# Patient Record
Sex: Female | Born: 1945 | Race: Black or African American | Hispanic: No | Smoking: Never smoker
Health system: Southern US, Community
[De-identification: ages and names within clinical notes are randomized; demographics above are authoritative.]

## PROBLEM LIST (undated history)

## (undated) DIAGNOSIS — G43909 Migraine, unspecified, not intractable, without status migrainosus: Secondary | ICD-10-CM

## (undated) DIAGNOSIS — M109 Gout, unspecified: Secondary | ICD-10-CM

## (undated) DIAGNOSIS — Z9889 Other specified postprocedural states: Secondary | ICD-10-CM

## (undated) DIAGNOSIS — E119 Type 2 diabetes mellitus without complications: Secondary | ICD-10-CM

## (undated) DIAGNOSIS — I10 Essential (primary) hypertension: Secondary | ICD-10-CM

## (undated) DIAGNOSIS — M549 Dorsalgia, unspecified: Secondary | ICD-10-CM

## (undated) DIAGNOSIS — G8929 Other chronic pain: Secondary | ICD-10-CM

## (undated) DIAGNOSIS — J45909 Unspecified asthma, uncomplicated: Secondary | ICD-10-CM

## (undated) HISTORY — PX: BREAST SURGERY: SHX581

## (undated) HISTORY — DX: Type 2 diabetes mellitus without complications: E11.9

## (undated) HISTORY — PX: BREAST EXCISIONAL BIOPSY: SUR124

## (undated) HISTORY — PX: ABDOMINAL HYSTERECTOMY: SHX81

## (undated) HISTORY — DX: Gout, unspecified: M10.9

## (undated) HISTORY — PX: BREAST BIOPSY: SHX20

---

## 2010-12-12 DIAGNOSIS — Z9889 Other specified postprocedural states: Secondary | ICD-10-CM

## 2010-12-12 HISTORY — DX: Other specified postprocedural states: Z98.890

## 2010-12-30 ENCOUNTER — Ambulatory Visit (HOSPITAL_COMMUNITY)
Admission: RE | Admit: 2010-12-30 | Discharge: 2010-12-30 | Payer: Self-pay | Source: Home / Self Care | Attending: Internal Medicine | Admitting: Internal Medicine

## 2011-01-10 ENCOUNTER — Ambulatory Visit (HOSPITAL_COMMUNITY)
Admission: RE | Admit: 2011-01-10 | Discharge: 2011-01-10 | Payer: Self-pay | Source: Home / Self Care | Attending: Internal Medicine | Admitting: Internal Medicine

## 2011-01-12 HISTORY — PX: COLONOSCOPY: SHX174

## 2011-01-17 ENCOUNTER — Encounter: Payer: Self-pay | Admitting: Internal Medicine

## 2011-01-24 ENCOUNTER — Encounter: Payer: Medicare Other | Admitting: Internal Medicine

## 2011-01-24 ENCOUNTER — Ambulatory Visit (HOSPITAL_COMMUNITY)
Admission: RE | Admit: 2011-01-24 | Discharge: 2011-01-24 | Disposition: A | Discharge status: Home / Self Care | Payer: Medicare Other | Source: Ambulatory Visit | Attending: Internal Medicine | Admitting: Internal Medicine

## 2011-01-24 DIAGNOSIS — Z1211 Encounter for screening for malignant neoplasm of colon: Secondary | ICD-10-CM | POA: Insufficient documentation

## 2011-01-27 NOTE — Letter (Signed)
Summary: [facility] TRIAGE  [facility] TRIAGE   Imported By: Rexene Alberts 01/17/2011 14:40:38  _____________________________________________________________________  External Attachment:    Type:   Image     Comment:   External Document  Appended Document: [facility] TRIAGE ok as is  Appended Document: [facility] TRIAGE PATIENT IS PICKING UP HER INSTRUCTIONS AT THE OFFICE

## 2011-02-01 NOTE — Op Note (Signed)
  NAME:  Brianna Manning, VATH NO.:  [id number]  MEDICAL RECORD NO.:  [id number]           PATIENT TYPE:  O  LOCATION:  DAYP                          FACILITY:  [facility]  PHYSICIAN:  R. Roetta Sessions, M.D. DATE OF BIRTH:  October 20, 1946  DATE OF PROCEDURE:  01/24/2011 DATE OF DISCHARGE:                              OPERATIVE REPORT   PROCEDURE:  Screening colonoscopy.  INDICATIONS FOR PROCEDURE:  A 65 year old lady, referred for first ever screening colonoscopy.  No lower GI tract symptoms.  No family history of colon cancer, colon polyps.  Colonoscopy is now being done as screening maneuver.  Risks, benefits, limitations, alternatives, imponderables have been reviewed, questions answered.  Please see the documentation in the medical record.  PROCEDURE NOTE:  O2 saturation, blood pressure, pulse, respirations were monitored throughout the entire procedure.  CONSCIOUS SEDATION:  Versed 5 mg IV, Demerol 100 mg IV in divided doses.  INSTRUMENT:  Pentax video chip system.  FINDINGS:  Digital rectal exam revealed no abnormalities.  Endoscopic findings:  Prep was adequate.  Colon:  Colonic mucosa was surveyed from the rectosigmoid junction through the left transverse right colon to the appendiceal orifice, ileocecal valve/cecum.  These structures were well seen and photographed for the record.  From this level, scope was slowly and cautiously withdrawn.  All previously mucosal surfaces were again seen.  The colonic mucosa appeared normal.  Scope was pulled down to the rectum where a thorough examination of rectal mucosa including retroflexed view of the anal verge demonstrated only a couple of anal papilla.  The patient tolerated the procedure well.  Cecal withdrawal time 7 minutes.  IMPRESSION: 1. Anal papilla, otherwise normal rectum. 2. Normal colon.  RECOMMENDATIONS:  Consider repeat screening colonoscopy in 10 years.     Jonathon Bellows, M.D.     RMR/MEDQ   D:  01/24/2011  T:  01/24/2011  Job:  [id number]  Electronically Signed by Lorrin Goodell M.D. on 02/01/2011 02:34:31 PM

## 2011-03-31 ENCOUNTER — Ambulatory Visit (HOSPITAL_COMMUNITY)
Admission: RE | Admit: 2011-03-31 | Discharge: 2011-03-31 | Disposition: A | Discharge status: Home / Self Care | Payer: Medicare Other | Source: Ambulatory Visit | Attending: Internal Medicine | Admitting: Internal Medicine

## 2011-03-31 ENCOUNTER — Other Ambulatory Visit (HOSPITAL_COMMUNITY): Payer: Self-pay | Admitting: Internal Medicine

## 2011-03-31 DIAGNOSIS — M1990 Unspecified osteoarthritis, unspecified site: Secondary | ICD-10-CM

## 2011-03-31 DIAGNOSIS — M545 Low back pain, unspecified: Secondary | ICD-10-CM | POA: Insufficient documentation

## 2011-07-25 ENCOUNTER — Encounter: Payer: Self-pay | Admitting: *Deleted

## 2011-07-25 ENCOUNTER — Other Ambulatory Visit: Payer: Self-pay

## 2011-07-25 ENCOUNTER — Emergency Department (HOSPITAL_COMMUNITY)
Admission: EM | Admit: 2011-07-25 | Discharge: 2011-07-25 | Disposition: A | Discharge status: Home / Self Care | Payer: Medicare Other | Attending: Emergency Medicine | Admitting: Emergency Medicine

## 2011-07-25 DIAGNOSIS — G43909 Migraine, unspecified, not intractable, without status migrainosus: Secondary | ICD-10-CM

## 2011-07-25 DIAGNOSIS — I10 Essential (primary) hypertension: Secondary | ICD-10-CM | POA: Insufficient documentation

## 2011-07-25 LAB — BASIC METABOLIC PANEL
CO2: 24 mEq/L (ref 19–32)
Calcium: 9.9 mg/dL (ref 8.4–10.5)
Sodium: 141 mEq/L (ref 135–145)

## 2011-07-25 MED ORDER — OXYCODONE-ACETAMINOPHEN 5-325 MG PO TABS: 1.0000 | ORAL_TABLET | ORAL | Status: AC | PRN

## 2011-07-25 NOTE — ED Notes (Signed)
Pt states that her blood pressure was high this am. Also c/o headache, pain under her left breast and have felt like her heart was fluttering. Also states that she had some slurred speech this am but has stopped now.

## 2011-07-25 NOTE — ED Notes (Signed)
Patient is patiently waiting in her room states she is having a headache. Turned off lights to see if that helps any and updated vitals.

## 2011-08-25 NOTE — ED Provider Notes (Signed)
History     CSN: [id number] Arrival date & time: 07/25/2011 10:58 AM   Chief Complaint  Patient presents with  . Hypertension     (Include location/radiation/quality/duration/timing/severity/associated sxs/prior treatment) HPI She c/o a migraine and htn She denies fever, rash, weakness.     History reviewed. No pertinent past medical history.   Past Surgical History  Procedure Date  . Breast surgery     biopsy  . Abdominal hysterectomy     History reviewed. No pertinent family history.  History  Substance Use Topics  . Smoking status: Never Smoker   . Smokeless tobacco: Not on file  . Alcohol Use: No    OB History    Grav Para Term Preterm Abortions TAB SAB Ect Mult Living                  Review of Systems  Constitutional: Negative for chills and diaphoresis.  HENT: Negative for congestion and neck pain.   Eyes: Positive for photophobia. Negative for redness.  Respiratory: Negative for cough, chest tightness and shortness of breath.   Gastrointestinal: Positive for nausea. Negative for abdominal pain.  Genitourinary: Negative for dysuria.  Musculoskeletal: Negative for back pain.  Skin: Negative for rash.  Neurological: Positive for headaches. Negative for light-headedness and numbness.  Psychiatric/Behavioral: Negative for confusion.    Allergies  Review of patient's allergies indicates no known allergies.  Home Medications   Current Outpatient Rx  Name Route Sig Dispense Refill  . IBUPROFEN 200 MG PO TABS Oral Take 400 mg by mouth every 6 (six) hours as needed. For pain     . ONE-A-DAY WOMENS 50 PLUS PO Oral Take 1 tablet by mouth daily.      Marland Kitchen RASPBERRY PO Oral Take 1 tablet by mouth daily.        Physical Exam    BP 168/98  Pulse 66  Temp(Src) 98.1 F (36.7 C) (Oral)  Resp 18  Ht 5\' 4"  (1.626 m)  Wt 250 lb (113.399 kg)  BMI 42.91 kg/m2  SpO2 98%  Physical Exam  Constitutional: She is oriented to person, place, and time. She appears  well-developed and well-nourished.  HENT:  Head: Normocephalic and atraumatic.  Eyes: Pupils are equal, round, and reactive to light.  Neck: Normal range of motion.  Cardiovascular: Normal rate, regular rhythm and normal heart sounds.   No murmur heard. Pulmonary/Chest: Effort normal and breath sounds normal. No respiratory distress. She has no wheezes. She has no rales.  Abdominal: Soft. She exhibits no distension and no mass. There is no tenderness. There is no rebound and no guarding.  Musculoskeletal: Normal range of motion. She exhibits no edema and no tenderness.       No nuchal rigidity  Neurological: She is alert and oriented to person, place, and time. No cranial nerve deficit.  Skin: Skin is warm and dry. No rash noted. No erythema.       No rash  Psychiatric: She has a normal mood and affect. Her behavior is normal.    ED Course  Procedures  Results for orders placed during the hospital encounter of 07/25/11  BASIC METABOLIC PANEL      Component Value Range   Sodium 141  135 - 145 (mEq/L)   Potassium 3.6  3.5 - 5.1 (mEq/L)   Chloride 104  96 - 112 (mEq/L)   CO2 24  19 - 32 (mEq/L)   Glucose, Bld 95  70 - 99 (mg/dL)   BUN 10  6 - 23 (mg/dL)   Creatinine, Ser 0.96  0.50 - 1.10 (mg/dL)   Calcium 9.9  8.4 - 04.5 (mg/dL)   GFR calc non Af Amer >60  >60 (mL/min)   GFR calc Af Amer >60  >60 (mL/min)   No results found.   1. Hypertension   2. Migraine headache      MDM Migraine htn       Nicholes Stairs, MD 08/30/11 1406

## 2011-11-07 ENCOUNTER — Ambulatory Visit (HOSPITAL_COMMUNITY)
Admission: RE | Admit: 2011-11-07 | Discharge: 2011-11-07 | Disposition: A | Discharge status: Home / Self Care | Payer: Medicare Other | Source: Ambulatory Visit | Attending: Internal Medicine | Admitting: Internal Medicine

## 2011-11-07 DIAGNOSIS — R0609 Other forms of dyspnea: Secondary | ICD-10-CM | POA: Insufficient documentation

## 2011-11-07 DIAGNOSIS — R002 Palpitations: Secondary | ICD-10-CM

## 2011-11-07 DIAGNOSIS — R0989 Other specified symptoms and signs involving the circulatory and respiratory systems: Secondary | ICD-10-CM | POA: Insufficient documentation

## 2011-11-07 NOTE — Progress Notes (Signed)
*  PRELIMINARY RESULTS* Echocardiogram 24H Holter monitor  has been performed.  Brianna Manning 11/07/2011, 1:40 PM

## 2011-11-09 NOTE — Procedures (Signed)
NAMEMarland Kitchen  ALAISA, Brianna Manning NO.:  [id number]  MEDICAL RECORD NO.:  [id number]  LOCATION:  CARDIOPU                      FACILITY:  [facility]  PHYSICIAN:  Gerrit Friends. Dietrich Pates, MD, FACCDATE OF BIRTH:  [birth year]  DATE OF PROCEDURE: DATE OF DISCHARGE:  11/07/2011                               HOLTER MONITOR   DATE OF RECORDING:  November 07, 2011 - November 08, 2011.  REFERRING PHYSICIAN:  Tesfaye D. Felecia Shelling, MD  CLINICAL DATA:  A 65 year old woman with palpitations and dyspnea. 1. Continuous electrocardiographic monitoring was maintained for 24     hours during which the predominant rhythm was normal sinus with     minimal sinus tachycardia to a peak heart rate of 114 beats per     minute. 2. No ventricular ectopy was identified.  Minimal supraventricular     ectopy was present with a total of 2 beats identified over the 24-     hour recording interval.  No other arrhythmia was detected. 3. No significant ST-segment elevation or depression occurred. 4. A complete diary of activity was returned with 7 episodes of     dyspnea, 2 of them associated with palpitations reported, each of     which occurred with activity.  On all occasions, the rhythm was     normal sinus without significant ST-segment abnormalities.     Gerrit Friends. Dietrich Pates, MD, [facility]     RMR/MEDQ  D:  11/08/2011  T:  11/08/2011  Job:  [id number]

## 2012-04-13 DIAGNOSIS — I10 Essential (primary) hypertension: Secondary | ICD-10-CM | POA: Diagnosis not present

## 2012-04-13 DIAGNOSIS — IMO0001 Reserved for inherently not codable concepts without codable children: Secondary | ICD-10-CM | POA: Diagnosis not present

## 2012-04-13 DIAGNOSIS — J309 Allergic rhinitis, unspecified: Secondary | ICD-10-CM | POA: Diagnosis not present

## 2012-05-03 ENCOUNTER — Other Ambulatory Visit (HOSPITAL_COMMUNITY): Payer: Self-pay | Admitting: Internal Medicine

## 2012-05-03 ENCOUNTER — Ambulatory Visit (HOSPITAL_COMMUNITY)
Admission: RE | Admit: 2012-05-03 | Discharge: 2012-05-03 | Disposition: A | Discharge status: Home / Self Care | Payer: Medicare Other | Source: Ambulatory Visit | Attending: Internal Medicine | Admitting: Internal Medicine

## 2012-05-03 DIAGNOSIS — J40 Bronchitis, not specified as acute or chronic: Secondary | ICD-10-CM

## 2012-05-03 DIAGNOSIS — M1990 Unspecified osteoarthritis, unspecified site: Secondary | ICD-10-CM | POA: Diagnosis not present

## 2012-05-03 DIAGNOSIS — I10 Essential (primary) hypertension: Secondary | ICD-10-CM | POA: Insufficient documentation

## 2012-05-03 DIAGNOSIS — R062 Wheezing: Secondary | ICD-10-CM | POA: Diagnosis not present

## 2012-05-03 DIAGNOSIS — J410 Simple chronic bronchitis: Secondary | ICD-10-CM | POA: Diagnosis not present

## 2012-05-03 DIAGNOSIS — R0602 Shortness of breath: Secondary | ICD-10-CM | POA: Diagnosis not present

## 2012-05-29 DIAGNOSIS — N390 Urinary tract infection, site not specified: Secondary | ICD-10-CM | POA: Diagnosis not present

## 2012-06-30 ENCOUNTER — Emergency Department (HOSPITAL_COMMUNITY)
Admission: EM | Admit: 2012-06-30 | Discharge: 2012-06-30 | Disposition: A | Discharge status: Home / Self Care | Payer: Medicare Other | Attending: Emergency Medicine | Admitting: Emergency Medicine

## 2012-06-30 ENCOUNTER — Encounter (HOSPITAL_COMMUNITY): Payer: Self-pay | Admitting: *Deleted

## 2012-06-30 DIAGNOSIS — R0602 Shortness of breath: Secondary | ICD-10-CM | POA: Diagnosis not present

## 2012-06-30 DIAGNOSIS — I10 Essential (primary) hypertension: Secondary | ICD-10-CM | POA: Diagnosis not present

## 2012-06-30 DIAGNOSIS — E860 Dehydration: Secondary | ICD-10-CM | POA: Insufficient documentation

## 2012-06-30 DIAGNOSIS — R55 Syncope and collapse: Secondary | ICD-10-CM | POA: Insufficient documentation

## 2012-06-30 DIAGNOSIS — R42 Dizziness and giddiness: Secondary | ICD-10-CM | POA: Diagnosis not present

## 2012-06-30 DIAGNOSIS — R6889 Other general symptoms and signs: Secondary | ICD-10-CM | POA: Diagnosis not present

## 2012-06-30 HISTORY — DX: Unspecified asthma, uncomplicated: J45.909

## 2012-06-30 HISTORY — DX: Essential (primary) hypertension: I10

## 2012-06-30 LAB — CBC WITH DIFFERENTIAL/PLATELET
Hemoglobin: 12.4 g/dL (ref 12.0–15.0)
Lymphocytes Relative: 19 % (ref 12–46)
Lymphs Abs: 2.1 10*3/uL (ref 0.7–4.0)
MCH: 31.6 pg (ref 26.0–34.0)
Monocytes Relative: 6 % (ref 3–12)
Neutro Abs: 7.6 10*3/uL (ref 1.7–7.7)
Neutrophils Relative %: 72 % (ref 43–77)
RBC: 3.93 MIL/uL (ref 3.87–5.11)

## 2012-06-30 LAB — BASIC METABOLIC PANEL
GFR calc Af Amer: 53 mL/min — ABNORMAL LOW (ref >90)
GFR calc non Af Amer: 46 mL/min — ABNORMAL LOW (ref >90)
Glucose, Bld: 133 mg/dL — ABNORMAL HIGH (ref 70–99)
Potassium: 3.6 mEq/L (ref 3.5–5.1)
Sodium: 142 mEq/L (ref 135–145)

## 2012-06-30 LAB — POCT I-STAT TROPONIN I

## 2012-06-30 MED ORDER — SODIUM CHLORIDE 0.9 % IV BOLUS (SEPSIS): 1000.0000 mL | Freq: Once | INTRAVENOUS | Status: DC

## 2012-06-30 MED ORDER — ALBUTEROL SULFATE (5 MG/ML) 0.5% IN NEBU: 2.5000 mg | INHALATION_SOLUTION | Freq: Once | RESPIRATORY_TRACT | Status: DC

## 2012-06-30 MED ORDER — SODIUM CHLORIDE 0.9 % IV BOLUS (SEPSIS)
1000.0000 mL | Freq: Once | INTRAVENOUS | Status: AC
  Administered 2012-06-30: 1000 mL via INTRAVENOUS

## 2012-06-30 NOTE — ED Provider Notes (Signed)
History     CSN: [id number]  Arrival date & time 06/30/12  1449   First MD Initiated Contact with Patient 06/30/12 1522      Chief Complaint  Patient presents with  . Near Syncope    (Consider location/radiation/quality/duration/timing/severity/associated sxs/prior treatment) Patient is a 66 y.o. female presenting with general illness. The history is provided by the patient and a friend.  Illness  The current episode started today. The onset was sudden. The problem occurs rarely. The problem has been rapidly improving. The problem is severe. The symptoms are relieved by rest (IVF). Nothing aggravates the symptoms. Associated symptoms include nausea and wheezing. Pertinent negatives include no fever, no decreased vision, no double vision, no abdominal pain, no vomiting, no congestion, no headaches, no rhinorrhea, no sore throat, no muscle aches, no cough, no URI and no rash. She has received no recent medical care.    Past Medical History  Diagnosis Date  . Hypertension   . Asthma     Past Surgical History  Procedure Date  . Breast surgery     biopsy  . Abdominal hysterectomy     No family history on file.  History  Substance Use Topics  . Smoking status: Never Smoker   . Smokeless tobacco: Not on file  . Alcohol Use: No    OB History    Grav Para Term Preterm Abortions TAB SAB Ect Mult Living                  Review of Systems  Constitutional: Negative for fever, chills, diaphoresis and fatigue.  HENT: Negative for congestion, sore throat and rhinorrhea.   Eyes: Negative for double vision and visual disturbance.  Respiratory: Positive for shortness of breath (relieved with albuterol) and wheezing. Negative for cough and chest tightness.   Cardiovascular: Negative for chest pain and palpitations.  Gastrointestinal: Positive for nausea. Negative for vomiting and abdominal pain.  Musculoskeletal: Negative for back pain.  Skin: Negative for color change and rash.    Neurological: Positive for light-headedness. Negative for dizziness, syncope (presyncope) and headaches.  Psychiatric/Behavioral: Negative for confusion.  All other systems reviewed and are negative.    Allergies  Review of patient's allergies indicates no known allergies.  Home Medications   Current Outpatient Rx  Name Route Sig Dispense Refill  . LOSARTAN POTASSIUM-HCTZ 50-12.5 MG PO TABS Oral Take 1 tablet by mouth daily.    . ONE-A-DAY WOMENS 50 PLUS PO Oral Take 1 tablet by mouth daily.     Marland Kitchen NAPROXEN SODIUM 220 MG PO TABS Oral Take 440 mg by mouth daily as needed. For pain    . NEBIVOLOL HCL 5 MG PO TABS Oral Take 5 mg by mouth daily.      BP 126/72  Pulse 82  Temp 97.9 F (36.6 C) (Oral)  Resp 23  SpO2 99%  Physical Exam  Nursing note and vitals reviewed. Constitutional: She is oriented to person, place, and time. She appears well-developed and well-nourished. No distress.  HENT:  Head: Normocephalic and atraumatic.  Mouth/Throat: Mucous membranes are dry (mildly).  Eyes: Pupils are equal, round, and reactive to light.  Cardiovascular: Normal rate, regular rhythm, normal heart sounds and intact distal pulses.   Pulmonary/Chest: Effort normal and breath sounds normal. No respiratory distress.       Slightly prolonged expirations  Abdominal: Soft. She exhibits no distension. There is no tenderness.  Neurological: She is alert and oriented to person, place, and time.  Skin: Skin is  warm and dry.  Psychiatric: She has a normal mood and affect.    ED Course  Procedures (including critical care time)  Labs Reviewed  BASIC METABOLIC PANEL - Abnormal; Notable for the following:    Glucose, Bld 133 (*)     Creatinine, Ser 1.20 (*)     GFR calc non Af Amer 46 (*)     GFR calc Af Amer 53 (*)     All other components within normal limits  CBC WITH DIFFERENTIAL - Abnormal; Notable for the following:    WBC 10.6 (*)     All other components within normal limits  POCT  I-STAT TROPONIN I   No results found.   Date: 06/30/2012  Rate: 68  Rhythm: normal sinus rhythm  QRS Axis: normal  Intervals: normal  ST/T Wave abnormalities: nonspecific T wave changes  Conduction Disutrbances:none  Narrative Interpretation:   Old EKG Reviewed: unchanged (07/25/11)    1. Pre-syncope   2. Dehydration       MDM  This is a 66 year old female who presents today after a near syncopal episode. She states they were at a church function [place] when she began feeling like she was going to pass out. She said she felt weak, lightheaded, and nauseated. She did have some mild shortness of breath with this, but this significantly improved after using her albuterol inhaler. He denies any chest pain, chest pressure, palpitations, any neuro deficits. She says that she was outside for a good portion of the day, and only drank most of a bottle of water. She has not passed out previously, nor has she felt like she was about to. She still has residual mild shortness of breath, but her symptoms have otherwise resolved. The patient is alert and oriented, does not appear in any distress, has slightly prolonged expirations, mild strandiness of her mucus membranes, but exam is otherwise unremarkable at this point in time. Sexual mild increase in heart rate, but stable blood pressure, the patient is asymptomatic with the Feel the patient's symptoms are most likely due to dehydration, and being out in the heat too long today. Will rehydrate her, treat her continued shortness of breath, and check basic labs to ensure that there are no other contributing factors to her symptoms.  Patient with mild elevation of her creatinine at this point in time. Patient feels back at baseline after IV fluid. Discussed with the patient the importance of making sure she stays hydrated over the next couple of days, as well as importance of staying hydrated and she felt the heat. Discussed followup with her regular  doctor to make sure that she's not having further problems and to reevaluate her creatinine as needed, as well as indications for return. The patient expressed understanding with this plan.     Theotis Burrow, MD 07/01/12 [phone]

## 2012-06-30 NOTE — ED Notes (Addendum)
At celebration and outside playing "[place] be gain feeling weak. Diaphoretic at scene. Supine sbp 90. 500 ns bolus . 20 ga. Lt. Hand.  C/o sob enroute. On 2l [place]. Lg. Sds. Clear.

## 2012-06-30 NOTE — ED Provider Notes (Signed)
I saw and evaluated the patient, reviewed the resident's note and I agree with the findings and plan.  Agree with EKG interpretation  Pt with near syncope while out in the sun all day. Mildly elevated Cr consistent with dehydration. She is otherwise feeling better. Anticipate discharge.  Christin Mccreedy B. Bernette Mayers, MD 06/30/12 1714

## 2012-07-03 DIAGNOSIS — R42 Dizziness and giddiness: Secondary | ICD-10-CM | POA: Diagnosis not present

## 2012-07-03 DIAGNOSIS — E860 Dehydration: Secondary | ICD-10-CM | POA: Diagnosis not present

## 2012-07-04 DIAGNOSIS — E7800 Pure hypercholesterolemia, unspecified: Secondary | ICD-10-CM | POA: Diagnosis not present

## 2012-07-04 DIAGNOSIS — E119 Type 2 diabetes mellitus without complications: Secondary | ICD-10-CM | POA: Diagnosis not present

## 2012-07-04 DIAGNOSIS — I10 Essential (primary) hypertension: Secondary | ICD-10-CM | POA: Diagnosis not present

## 2012-07-13 DIAGNOSIS — L259 Unspecified contact dermatitis, unspecified cause: Secondary | ICD-10-CM | POA: Diagnosis not present

## 2012-07-13 DIAGNOSIS — I10 Essential (primary) hypertension: Secondary | ICD-10-CM | POA: Diagnosis not present

## 2012-11-27 DIAGNOSIS — Z23 Encounter for immunization: Secondary | ICD-10-CM | POA: Diagnosis not present

## 2013-06-27 DIAGNOSIS — R7309 Other abnormal glucose: Secondary | ICD-10-CM | POA: Diagnosis not present

## 2013-06-27 DIAGNOSIS — M1990 Unspecified osteoarthritis, unspecified site: Secondary | ICD-10-CM | POA: Diagnosis not present

## 2013-06-27 DIAGNOSIS — I10 Essential (primary) hypertension: Secondary | ICD-10-CM | POA: Diagnosis not present

## 2013-06-27 DIAGNOSIS — R42 Dizziness and giddiness: Secondary | ICD-10-CM | POA: Diagnosis not present

## 2013-07-11 DIAGNOSIS — E669 Obesity, unspecified: Secondary | ICD-10-CM | POA: Diagnosis not present

## 2013-07-11 DIAGNOSIS — M1990 Unspecified osteoarthritis, unspecified site: Secondary | ICD-10-CM | POA: Diagnosis not present

## 2013-07-11 DIAGNOSIS — M549 Dorsalgia, unspecified: Secondary | ICD-10-CM | POA: Diagnosis not present

## 2013-07-11 DIAGNOSIS — I10 Essential (primary) hypertension: Secondary | ICD-10-CM | POA: Diagnosis not present

## 2013-07-24 ENCOUNTER — Other Ambulatory Visit (HOSPITAL_COMMUNITY): Payer: Self-pay | Admitting: Internal Medicine

## 2013-07-24 DIAGNOSIS — Z139 Encounter for screening, unspecified: Secondary | ICD-10-CM

## 2013-07-29 ENCOUNTER — Ambulatory Visit (HOSPITAL_COMMUNITY)
Admission: RE | Admit: 2013-07-29 | Discharge: 2013-07-29 | Disposition: A | Discharge status: Home / Self Care | Payer: Medicare Other | Source: Ambulatory Visit | Attending: Internal Medicine | Admitting: Internal Medicine

## 2013-07-29 DIAGNOSIS — Z139 Encounter for screening, unspecified: Secondary | ICD-10-CM

## 2013-07-29 DIAGNOSIS — Z1231 Encounter for screening mammogram for malignant neoplasm of breast: Secondary | ICD-10-CM | POA: Insufficient documentation

## 2013-07-31 ENCOUNTER — Other Ambulatory Visit: Payer: Self-pay | Admitting: Internal Medicine

## 2013-07-31 DIAGNOSIS — R928 Other abnormal and inconclusive findings on diagnostic imaging of breast: Secondary | ICD-10-CM

## 2013-08-14 ENCOUNTER — Other Ambulatory Visit: Payer: Self-pay | Admitting: Internal Medicine

## 2013-08-14 ENCOUNTER — Ambulatory Visit (HOSPITAL_COMMUNITY)
Admission: RE | Admit: 2013-08-14 | Discharge: 2013-08-14 | Disposition: A | Discharge status: Home / Self Care | Payer: Medicare Other | Source: Ambulatory Visit | Attending: Internal Medicine | Admitting: Internal Medicine

## 2013-08-14 DIAGNOSIS — R928 Other abnormal and inconclusive findings on diagnostic imaging of breast: Secondary | ICD-10-CM | POA: Diagnosis not present

## 2013-08-14 DIAGNOSIS — R922 Inconclusive mammogram: Secondary | ICD-10-CM | POA: Diagnosis not present

## 2013-08-14 DIAGNOSIS — Z1231 Encounter for screening mammogram for malignant neoplasm of breast: Secondary | ICD-10-CM | POA: Diagnosis not present

## 2013-09-20 DIAGNOSIS — Z23 Encounter for immunization: Secondary | ICD-10-CM | POA: Diagnosis not present

## 2014-08-06 DIAGNOSIS — J309 Allergic rhinitis, unspecified: Secondary | ICD-10-CM | POA: Diagnosis not present

## 2014-08-06 DIAGNOSIS — N390 Urinary tract infection, site not specified: Secondary | ICD-10-CM | POA: Diagnosis not present

## 2014-08-06 DIAGNOSIS — K5900 Constipation, unspecified: Secondary | ICD-10-CM | POA: Diagnosis not present

## 2014-08-27 ENCOUNTER — Other Ambulatory Visit (HOSPITAL_COMMUNITY): Payer: Self-pay | Admitting: Internal Medicine

## 2014-08-27 DIAGNOSIS — Z139 Encounter for screening, unspecified: Secondary | ICD-10-CM

## 2014-08-27 DIAGNOSIS — Z1231 Encounter for screening mammogram for malignant neoplasm of breast: Secondary | ICD-10-CM

## 2014-09-22 DIAGNOSIS — J45909 Unspecified asthma, uncomplicated: Secondary | ICD-10-CM | POA: Diagnosis not present

## 2014-09-22 DIAGNOSIS — M1990 Unspecified osteoarthritis, unspecified site: Secondary | ICD-10-CM | POA: Diagnosis not present

## 2014-09-22 DIAGNOSIS — I10 Essential (primary) hypertension: Secondary | ICD-10-CM | POA: Diagnosis not present

## 2014-09-22 DIAGNOSIS — M5489 Other dorsalgia: Secondary | ICD-10-CM | POA: Diagnosis not present

## 2014-09-22 DIAGNOSIS — Z23 Encounter for immunization: Secondary | ICD-10-CM | POA: Diagnosis not present

## 2014-09-25 ENCOUNTER — Ambulatory Visit (HOSPITAL_COMMUNITY)
Admission: RE | Admit: 2014-09-25 | Discharge: 2014-09-25 | Disposition: A | Discharge status: Home / Self Care | Payer: Medicare Other | Source: Ambulatory Visit | Attending: Internal Medicine | Admitting: Internal Medicine

## 2014-09-25 ENCOUNTER — Other Ambulatory Visit (HOSPITAL_COMMUNITY): Payer: Self-pay | Admitting: Internal Medicine

## 2014-09-25 DIAGNOSIS — R079 Chest pain, unspecified: Secondary | ICD-10-CM

## 2014-09-25 DIAGNOSIS — I10 Essential (primary) hypertension: Secondary | ICD-10-CM | POA: Diagnosis not present

## 2014-09-25 DIAGNOSIS — Z1231 Encounter for screening mammogram for malignant neoplasm of breast: Secondary | ICD-10-CM | POA: Insufficient documentation

## 2015-01-01 ENCOUNTER — Other Ambulatory Visit (HOSPITAL_COMMUNITY): Payer: Self-pay | Admitting: Internal Medicine

## 2015-01-01 ENCOUNTER — Ambulatory Visit (HOSPITAL_COMMUNITY)
Admission: RE | Admit: 2015-01-01 | Discharge: 2015-01-01 | Disposition: A | Discharge status: Home / Self Care | Payer: Medicare Other | Source: Ambulatory Visit | Attending: Internal Medicine | Admitting: Internal Medicine

## 2015-01-01 DIAGNOSIS — R079 Chest pain, unspecified: Secondary | ICD-10-CM

## 2015-01-01 DIAGNOSIS — J45909 Unspecified asthma, uncomplicated: Secondary | ICD-10-CM | POA: Diagnosis not present

## 2015-01-01 DIAGNOSIS — R05 Cough: Secondary | ICD-10-CM | POA: Insufficient documentation

## 2015-01-01 DIAGNOSIS — I10 Essential (primary) hypertension: Secondary | ICD-10-CM | POA: Diagnosis not present

## 2015-01-01 DIAGNOSIS — M1990 Unspecified osteoarthritis, unspecified site: Secondary | ICD-10-CM | POA: Diagnosis not present

## 2015-03-11 ENCOUNTER — Ambulatory Visit (HOSPITAL_COMMUNITY)
Admission: RE | Admit: 2015-03-11 | Discharge: 2015-03-11 | Disposition: A | Discharge status: Home / Self Care | Payer: Medicare Other | Source: Ambulatory Visit | Attending: Internal Medicine | Admitting: Internal Medicine

## 2015-03-11 ENCOUNTER — Other Ambulatory Visit (HOSPITAL_COMMUNITY): Payer: Self-pay | Admitting: Internal Medicine

## 2015-03-11 DIAGNOSIS — J40 Bronchitis, not specified as acute or chronic: Secondary | ICD-10-CM | POA: Diagnosis not present

## 2015-03-11 DIAGNOSIS — M549 Dorsalgia, unspecified: Secondary | ICD-10-CM

## 2015-03-11 DIAGNOSIS — M5489 Other dorsalgia: Secondary | ICD-10-CM | POA: Diagnosis not present

## 2015-03-11 DIAGNOSIS — I10 Essential (primary) hypertension: Secondary | ICD-10-CM | POA: Diagnosis not present

## 2015-03-11 DIAGNOSIS — M47816 Spondylosis without myelopathy or radiculopathy, lumbar region: Secondary | ICD-10-CM | POA: Diagnosis not present

## 2015-08-10 DIAGNOSIS — M549 Dorsalgia, unspecified: Secondary | ICD-10-CM | POA: Diagnosis not present

## 2015-08-10 DIAGNOSIS — M1990 Unspecified osteoarthritis, unspecified site: Secondary | ICD-10-CM | POA: Diagnosis not present

## 2015-08-10 DIAGNOSIS — Z23 Encounter for immunization: Secondary | ICD-10-CM | POA: Diagnosis not present

## 2015-08-10 DIAGNOSIS — I10 Essential (primary) hypertension: Secondary | ICD-10-CM | POA: Diagnosis not present

## 2015-08-19 ENCOUNTER — Emergency Department (HOSPITAL_COMMUNITY)
Admission: EM | Admit: 2015-08-19 | Discharge: 2015-08-19 | Disposition: A | Discharge status: Home / Self Care | Payer: Medicare Other | Attending: Emergency Medicine | Admitting: Emergency Medicine

## 2015-08-19 ENCOUNTER — Encounter (HOSPITAL_COMMUNITY): Payer: Self-pay | Admitting: Emergency Medicine

## 2015-08-19 DIAGNOSIS — Z79899 Other long term (current) drug therapy: Secondary | ICD-10-CM | POA: Diagnosis not present

## 2015-08-19 DIAGNOSIS — I10 Essential (primary) hypertension: Secondary | ICD-10-CM | POA: Insufficient documentation

## 2015-08-19 DIAGNOSIS — M47816 Spondylosis without myelopathy or radiculopathy, lumbar region: Secondary | ICD-10-CM | POA: Diagnosis not present

## 2015-08-19 DIAGNOSIS — M47896 Other spondylosis, lumbar region: Secondary | ICD-10-CM | POA: Diagnosis not present

## 2015-08-19 DIAGNOSIS — M545 Low back pain, unspecified: Secondary | ICD-10-CM

## 2015-08-19 DIAGNOSIS — K5700 Diverticulitis of small intestine with perforation and abscess without bleeding: Secondary | ICD-10-CM | POA: Insufficient documentation

## 2015-08-19 DIAGNOSIS — M549 Dorsalgia, unspecified: Secondary | ICD-10-CM | POA: Diagnosis present

## 2015-08-19 DIAGNOSIS — J45909 Unspecified asthma, uncomplicated: Secondary | ICD-10-CM | POA: Insufficient documentation

## 2015-08-19 LAB — URINALYSIS, ROUTINE W REFLEX MICROSCOPIC
BILIRUBIN URINE: NEGATIVE
Glucose, UA: NEGATIVE mg/dL
Hgb urine dipstick: NEGATIVE
KETONES UR: NEGATIVE mg/dL
LEUKOCYTES UA: NEGATIVE
Nitrite: NEGATIVE
PH: 5.5 (ref 5.0–8.0)
Protein, ur: NEGATIVE mg/dL
Specific Gravity, Urine: 1.01 (ref 1.005–1.030)
UROBILINOGEN UA: 0.2 mg/dL (ref 0.0–1.0)

## 2015-08-19 MED ORDER — HYDROCODONE-ACETAMINOPHEN 5-325 MG PO TABS: 1.0000 | ORAL_TABLET | ORAL | Status: DC | PRN

## 2015-08-19 MED ORDER — HYDROCODONE-ACETAMINOPHEN 5-325 MG PO TABS: 2.0000 | ORAL_TABLET | ORAL | Status: DC | PRN

## 2015-08-19 MED ORDER — DIAZEPAM 5 MG PO TABS: 5.0000 mg | ORAL_TABLET | Freq: Four times a day (QID) | ORAL | Status: DC | PRN

## 2015-08-19 NOTE — ED Provider Notes (Signed)
CSN: [id number]   Arrival date & time 08/19/15 2006  History  This chart was scribed for Brianna Bale, MD by Brianna Manning, ED Scribe. This patient was seen in room [facility]14/[facility]14 and the patient's care was started at 9:03 PM.  Chief Complaint  Patient presents with  . Back Pain    HPI The history is provided by the patient. No language interpreter was used.   Brianna Manning is a 69 y.o. female who presents to the Emergency Department complaining of atraumatic lower mid back pain with onset at least 2 weeks ago. The constant pain radiates to the feet bilaterally and is described as "just hurting". She rates the pain 10/10 in severity. Tramadol and a muscle relaxer that she was prescribed by Dr. Felecia Shelling (PCP) weeks ago have provided insufficient pain relief and caused shaking. She is able to ambulate but the pain is worse with standing or laying flat. In the past she has been given injections that provided some relief with similar pain. Associated symptoms include constipation that she associates with the use of pain medication (improved with a laxative from the [employer]), increased urinary frequency, and urinary urge incontinence. Pt denies fever, dysuria,  bowel incontinence, nausea, and vomiting.   Past Medical History  Diagnosis Date  . Hypertension   . Asthma     Past Surgical History  Procedure Laterality Date  . Breast surgery      biopsy  . Abdominal hysterectomy      History reviewed. No pertinent family history.  Social History  Substance Use Topics  . Smoking status: Never Smoker   . Smokeless tobacco: None  . Alcohol Use: No     Review of Systems  Constitutional: Negative for fever and chills.  Gastrointestinal: Positive for constipation.       Heartburn  Genitourinary: Positive for frequency. Negative for dysuria.       Urinary urge incontinence  Musculoskeletal: Positive for back pain.  All other systems reviewed and are negative.   Home Medications    Prior to Admission medications   Medication Sig Start Date End Date Taking? Authorizing Provider  losartan-hydrochlorothiazide (HYZAAR) 50-12.5 MG per tablet Take 1 tablet by mouth daily.   Yes Historical Provider, MD  nebivolol (BYSTOLIC) 5 MG tablet Take 5 mg by mouth daily.   Yes Historical Provider, MD  omeprazole (PRILOSEC) 20 MG capsule Take 20 mg by mouth daily. 08/13/15  Yes Historical Provider, MD  traMADol (ULTRAM) 50 MG tablet Take 50 mg by mouth 2 (two) times daily. 08/10/15  Yes Historical Provider, MD  diazepam (VALIUM) 5 MG tablet Take 1 tablet (5 mg total) by mouth every 6 (six) hours as needed for muscle spasms. 08/19/15   Brianna Bale, MD  HYDROcodone-acetaminophen (NORCO) 5-325 MG per tablet Take 1 tablet by mouth every 4 (four) hours as needed. 08/19/15   Brianna Bale, MD  HYDROcodone-acetaminophen (NORCO/VICODIN) 5-325 MG per tablet Take 2 tablets by mouth every 4 (four) hours as needed. 08/19/15   Brianna Bale, MD  PROAIR HFA 108 (90 BASE) MCG/ACT inhaler Inhale 1-2 puffs into the lungs every 6 (six) hours as needed. Shortness of breath/wheezing 06/11/15   Historical Provider, MD    Allergies  Review of patient's allergies indicates no known allergies.  Triage Vitals: BP 129/67 mmHg  Pulse 80  Temp(Src) 97.5 F (36.4 C) (Oral)  Resp 20  Ht 5\' 7"  (1.702 m)  Wt 258 lb (117.028 kg)  BMI 40.40 kg/m2  SpO2 100%  Physical  Exam  Constitutional: She is oriented to person, place, and time. She appears well-developed and well-nourished.  HENT:  Head: Normocephalic and atraumatic.  Eyes: Conjunctivae and EOM are normal. Pupils are equal, round, and reactive to light.  Neck: Normal range of motion and phonation normal. Neck supple.  Cardiovascular: Normal rate and regular rhythm.   Pulmonary/Chest: Effort normal and breath sounds normal. She exhibits no tenderness.  Abdominal: Soft. She exhibits no distension. There is no tenderness. There is no guarding.  Musculoskeletal:  Normal range of motion.  Tender lumbar spine without deformity  Neurological: She is alert and oriented to person, place, and time. She exhibits normal muscle tone.  Skin: Skin is warm and dry.  Psychiatric: She has a normal mood and affect. Her behavior is normal. Judgment and thought content normal.  Nursing note and vitals reviewed.   ED Course  Procedures   DIAGNOSTIC STUDIES: Oxygen Saturation is 100% on RA, normal by my interpretation.    COORDINATION OF CARE: 9:12 PM Discussed treatment plan which includes pain management with pt at bedside and pt agreed to plan.  9:50 PM I reevaluated the pt and updated her on the plan for discharge with Vicodin. She is in agreement.  Medications - No data to display No data found.    Labs Reviewed  URINALYSIS, ROUTINE W REFLEX MICROSCOPIC (NOT AT [facility])    Imaging Review No results found.  EKG Interpretation  Date/Time:  Wednesday August 19 2015 20:19:16 EDT Ventricular Rate:  73 PR Interval:  184 QRS Duration: 94 QT Interval:  404 QTC Calculation: 445 R Axis:   -6 Text Interpretation:  Normal sinus rhythm Possible Left atrial enlargement Septal infarct , age undetermined Abnormal ECG Since last tracing T wave abnormality has resolved Confirmed by Effie Shy  MD, Vena Bassinger [phone]) on 08/19/2015 8:23:40 PM       MDM   Final diagnoses:  Midline low back pain without sciatica  Lumbar spondylosis, unspecified spinal osteoarthritis   Eval. C/w musculoskeletal LBP. Doubt fracture or spinal stenosis.  Nursing Notes Reviewed/ Care Coordinated Applicable Imaging Reviewed Interpretation of Laboratory Data incorporated into ED treatment  The patient appears reasonably screened and/or stabilized for discharge and I doubt any other medical condition or other [facility] requiring further screening, evaluation, or treatment in the ED at this time prior to discharge.  Plan: Home Medications- Valium, Norco; Home Treatments- rest; return here if  the recommended treatment, does not improve the symptoms; Recommended follow up- PCP prn   I personally performed the services described in this documentation, which was scribed in my presence. The recorded information has been reviewed and is accurate.   Brianna Bale, MD 08/30/15 [phone]

## 2015-08-19 NOTE — ED Notes (Signed)
Patient reports, "My chest is hurting on the left side too. Everything hurts on the left side."

## 2015-08-19 NOTE — Discharge Instructions (Signed)
Back Pain, Adult Low back pain is very common. About 1 in 5 people have back pain.The cause of low back pain is rarely dangerous. The pain often gets better over time.About half of people with a sudden onset of back pain feel better in just 2 weeks. About 8 in 10 people feel better by 6 weeks.  CAUSES Some common causes of back pain include:  Strain of the muscles or ligaments supporting the spine.  Wear and tear (degeneration) of the spinal discs.  Arthritis.  Direct injury to the back. DIAGNOSIS Most of the time, the direct cause of low back pain is not known.However, back pain can be treated effectively even when the exact cause of the pain is unknown.Answering your caregiver's questions about your overall health and symptoms is one of the most accurate ways to make sure the cause of your pain is not dangerous. If your caregiver needs more information, he or she may order lab work or imaging tests (X-rays or MRIs).However, even if imaging tests show changes in your back, this usually does not require surgery. HOME CARE INSTRUCTIONS For many people, back pain returns.Since low back pain is rarely dangerous, it is often a condition that people can learn to manageon their own.   Remain active. It is stressful on the back to sit or stand in one place. Do not sit, drive, or stand in one place for more than 30 minutes at a time. Take short walks on level surfaces as soon as pain allows.Try to increase the length of time you walk each day.  Do not stay in bed.Resting more than 1 or 2 days can delay your recovery.  Do not avoid exercise or work.Your body is made to move.It is not dangerous to be active, even though your back may hurt.Your back will likely heal faster if you return to being active before your pain is gone.  Pay attention to your body when you bend and lift. Many people have less discomfortwhen lifting if they bend their knees, keep the load close to their bodies,and  avoid twisting. Often, the most comfortable positions are those that put less stress on your recovering back.  Find a comfortable position to sleep. Use a firm mattress and lie on your side with your knees slightly bent. If you lie on your back, put a pillow under your knees.  Only take over-the-counter or prescription medicines as directed by your caregiver. Over-the-counter medicines to reduce pain and inflammation are often the most helpful.Your caregiver may prescribe muscle relaxant drugs.These medicines help dull your pain so you can more quickly return to your normal activities and healthy exercise.  Put ice on the injured area.  Put ice in a plastic bag.  Place a towel between your skin and the bag.  Leave the ice on for 15-20 minutes, 03-04 times a day for the first 2 to 3 days. After that, ice and heat may be alternated to reduce pain and spasms.  Ask your caregiver about trying back exercises and gentle massage. This may be of some benefit.  Avoid feeling anxious or stressed.Stress increases muscle tension and can worsen back pain.It is important to recognize when you are anxious or stressed and learn ways to manage it.Exercise is a great option. SEEK MEDICAL CARE IF:  You have pain that is not relieved with rest or medicine.  You have pain that does not improve in 1 week.  You have new symptoms.  You are generally not feeling well. SEEK   IMMEDIATE MEDICAL CARE IF:   You have pain that radiates from your back into your legs.  You develop new bowel or bladder control problems.  You have unusual weakness or numbness in your arms or legs.  You develop nausea or vomiting.  You develop abdominal pain.  You feel faint. Document Released: 11/28/2005 Document Revised: 05/29/2012 Document Reviewed: 04/01/2014 ExitCare Patient Information 2015 [employer]. This information is not intended to replace advice given to you by your health care provider. Make sure you  discuss any questions you have with your health care provider.  

## 2015-08-19 NOTE — ED Notes (Signed)
Per Dr Effie Shy, waiting for urine to result before pt to be discharged

## 2015-08-19 NOTE — ED Notes (Signed)
Patient complaining of lower back pain x 2 weeks radiating down legs bilaterally. States saw PCP and was given prescription for pain but reports medication is not helping her pain.

## 2015-08-25 ENCOUNTER — Other Ambulatory Visit (HOSPITAL_COMMUNITY): Payer: Self-pay | Admitting: Internal Medicine

## 2015-08-25 DIAGNOSIS — Z1231 Encounter for screening mammogram for malignant neoplasm of breast: Secondary | ICD-10-CM

## 2015-08-25 MED FILL — Hydrocodone-Acetaminophen Tab 5-325 MG: ORAL | Qty: 6 | Status: AC

## 2015-09-01 DIAGNOSIS — M545 Low back pain: Secondary | ICD-10-CM | POA: Diagnosis not present

## 2015-09-01 DIAGNOSIS — M5030 Other cervical disc degeneration, unspecified cervical region: Secondary | ICD-10-CM | POA: Diagnosis not present

## 2015-09-01 DIAGNOSIS — M542 Cervicalgia: Secondary | ICD-10-CM | POA: Diagnosis not present

## 2015-09-01 DIAGNOSIS — M5136 Other intervertebral disc degeneration, lumbar region: Secondary | ICD-10-CM | POA: Diagnosis not present

## 2015-09-01 DIAGNOSIS — M47812 Spondylosis without myelopathy or radiculopathy, cervical region: Secondary | ICD-10-CM | POA: Diagnosis not present

## 2015-09-01 DIAGNOSIS — M4726 Other spondylosis with radiculopathy, lumbar region: Secondary | ICD-10-CM | POA: Diagnosis not present

## 2015-09-01 DIAGNOSIS — M5440 Lumbago with sciatica, unspecified side: Secondary | ICD-10-CM | POA: Diagnosis not present

## 2015-09-07 DIAGNOSIS — I10 Essential (primary) hypertension: Secondary | ICD-10-CM | POA: Diagnosis not present

## 2015-09-07 DIAGNOSIS — M1990 Unspecified osteoarthritis, unspecified site: Secondary | ICD-10-CM | POA: Diagnosis not present

## 2015-09-07 DIAGNOSIS — M5489 Other dorsalgia: Secondary | ICD-10-CM | POA: Diagnosis not present

## 2015-09-28 ENCOUNTER — Ambulatory Visit (HOSPITAL_COMMUNITY)
Admission: RE | Admit: 2015-09-28 | Discharge: 2015-09-28 | Disposition: A | Discharge status: Home / Self Care | Payer: Medicare Other | Source: Ambulatory Visit | Attending: Internal Medicine | Admitting: Internal Medicine

## 2015-09-28 DIAGNOSIS — Z1231 Encounter for screening mammogram for malignant neoplasm of breast: Secondary | ICD-10-CM | POA: Diagnosis not present

## 2015-10-14 DIAGNOSIS — Z23 Encounter for immunization: Secondary | ICD-10-CM | POA: Diagnosis not present

## 2016-01-08 DIAGNOSIS — M5489 Other dorsalgia: Secondary | ICD-10-CM | POA: Diagnosis not present

## 2016-01-08 DIAGNOSIS — I10 Essential (primary) hypertension: Secondary | ICD-10-CM | POA: Diagnosis not present

## 2016-01-08 DIAGNOSIS — J4520 Mild intermittent asthma, uncomplicated: Secondary | ICD-10-CM | POA: Diagnosis not present

## 2016-01-13 DIAGNOSIS — I10 Essential (primary) hypertension: Secondary | ICD-10-CM | POA: Diagnosis not present

## 2016-02-10 DIAGNOSIS — J4521 Mild intermittent asthma with (acute) exacerbation: Secondary | ICD-10-CM | POA: Diagnosis not present

## 2016-02-18 ENCOUNTER — Ambulatory Visit (INDEPENDENT_AMBULATORY_CARE_PROVIDER_SITE_OTHER): Payer: Medicare Other | Admitting: Otolaryngology

## 2016-02-18 DIAGNOSIS — H9311 Tinnitus, right ear: Secondary | ICD-10-CM | POA: Diagnosis not present

## 2016-02-18 DIAGNOSIS — H9041 Sensorineural hearing loss, unilateral, right ear, with unrestricted hearing on the contralateral side: Secondary | ICD-10-CM | POA: Diagnosis not present

## 2016-02-23 ENCOUNTER — Other Ambulatory Visit (INDEPENDENT_AMBULATORY_CARE_PROVIDER_SITE_OTHER): Payer: Self-pay | Admitting: Otolaryngology

## 2016-02-23 DIAGNOSIS — H903 Sensorineural hearing loss, bilateral: Secondary | ICD-10-CM

## 2016-02-23 DIAGNOSIS — H93A9 Pulsatile tinnitus, unspecified ear: Secondary | ICD-10-CM

## 2016-03-02 ENCOUNTER — Ambulatory Visit (HOSPITAL_COMMUNITY)
Admission: RE | Admit: 2016-03-02 | Discharge: 2016-03-02 | Disposition: A | Discharge status: Home / Self Care | Payer: Medicare Other | Source: Ambulatory Visit | Attending: Otolaryngology | Admitting: Otolaryngology

## 2016-03-02 DIAGNOSIS — H903 Sensorineural hearing loss, bilateral: Secondary | ICD-10-CM

## 2016-03-02 DIAGNOSIS — H93A9 Pulsatile tinnitus, unspecified ear: Secondary | ICD-10-CM

## 2016-03-02 DIAGNOSIS — R51 Headache: Secondary | ICD-10-CM | POA: Diagnosis not present

## 2016-03-02 DIAGNOSIS — R9089 Other abnormal findings on diagnostic imaging of central nervous system: Secondary | ICD-10-CM | POA: Insufficient documentation

## 2016-03-02 DIAGNOSIS — I6623 Occlusion and stenosis of bilateral posterior cerebral arteries: Secondary | ICD-10-CM | POA: Insufficient documentation

## 2016-03-02 DIAGNOSIS — H9311 Tinnitus, right ear: Secondary | ICD-10-CM | POA: Diagnosis not present

## 2016-03-02 LAB — POCT I-STAT CREATININE: Creatinine, Ser: 1.1 mg/dL — ABNORMAL HIGH (ref 0.44–1.00)

## 2016-03-02 MED ORDER — GADOBENATE DIMEGLUMINE 529 MG/ML IV SOLN
20.0000 mL | Freq: Once | INTRAVENOUS | Status: AC | PRN
  Administered 2016-03-02: 20 mL via INTRAVENOUS

## 2016-04-07 ENCOUNTER — Ambulatory Visit (INDEPENDENT_AMBULATORY_CARE_PROVIDER_SITE_OTHER): Payer: Medicare Other | Admitting: Otolaryngology

## 2016-04-07 DIAGNOSIS — H9311 Tinnitus, right ear: Secondary | ICD-10-CM | POA: Diagnosis not present

## 2016-04-07 DIAGNOSIS — H903 Sensorineural hearing loss, bilateral: Secondary | ICD-10-CM

## 2016-05-16 ENCOUNTER — Other Ambulatory Visit (HOSPITAL_COMMUNITY): Payer: Self-pay | Admitting: Internal Medicine

## 2016-05-16 DIAGNOSIS — M159 Polyosteoarthritis, unspecified: Secondary | ICD-10-CM

## 2016-05-16 DIAGNOSIS — M5489 Other dorsalgia: Secondary | ICD-10-CM | POA: Diagnosis not present

## 2016-05-16 DIAGNOSIS — M150 Primary generalized (osteo)arthritis: Principal | ICD-10-CM

## 2016-05-16 DIAGNOSIS — I10 Essential (primary) hypertension: Secondary | ICD-10-CM | POA: Diagnosis not present

## 2016-05-16 DIAGNOSIS — M1990 Unspecified osteoarthritis, unspecified site: Secondary | ICD-10-CM | POA: Diagnosis not present

## 2016-05-16 DIAGNOSIS — Z0000 Encounter for general adult medical examination without abnormal findings: Secondary | ICD-10-CM | POA: Diagnosis not present

## 2016-05-16 DIAGNOSIS — N182 Chronic kidney disease, stage 2 (mild): Secondary | ICD-10-CM | POA: Diagnosis not present

## 2016-05-16 DIAGNOSIS — J4530 Mild persistent asthma, uncomplicated: Secondary | ICD-10-CM | POA: Diagnosis not present

## 2016-05-17 ENCOUNTER — Other Ambulatory Visit (HOSPITAL_COMMUNITY): Payer: Self-pay | Admitting: Internal Medicine

## 2016-05-17 DIAGNOSIS — Z780 Asymptomatic menopausal state: Secondary | ICD-10-CM

## 2016-05-17 DIAGNOSIS — M159 Polyosteoarthritis, unspecified: Secondary | ICD-10-CM

## 2016-05-17 DIAGNOSIS — M150 Primary generalized (osteo)arthritis: Principal | ICD-10-CM

## 2016-05-18 ENCOUNTER — Ambulatory Visit (HOSPITAL_COMMUNITY)
Admission: RE | Admit: 2016-05-18 | Discharge: 2016-05-18 | Disposition: A | Discharge status: Home / Self Care | Payer: Medicare Other | Source: Ambulatory Visit | Attending: Internal Medicine | Admitting: Internal Medicine

## 2016-05-18 DIAGNOSIS — Z780 Asymptomatic menopausal state: Secondary | ICD-10-CM | POA: Diagnosis not present

## 2016-05-18 DIAGNOSIS — Z13820 Encounter for screening for osteoporosis: Secondary | ICD-10-CM | POA: Diagnosis not present

## 2016-05-18 DIAGNOSIS — M1990 Unspecified osteoarthritis, unspecified site: Secondary | ICD-10-CM | POA: Insufficient documentation

## 2016-05-18 DIAGNOSIS — M150 Primary generalized (osteo)arthritis: Secondary | ICD-10-CM

## 2016-05-18 DIAGNOSIS — M159 Polyosteoarthritis, unspecified: Secondary | ICD-10-CM

## 2016-06-30 DIAGNOSIS — J4530 Mild persistent asthma, uncomplicated: Secondary | ICD-10-CM | POA: Diagnosis not present

## 2016-06-30 DIAGNOSIS — M1990 Unspecified osteoarthritis, unspecified site: Secondary | ICD-10-CM | POA: Diagnosis not present

## 2016-06-30 DIAGNOSIS — I10 Essential (primary) hypertension: Secondary | ICD-10-CM | POA: Diagnosis not present

## 2016-07-06 DIAGNOSIS — M546 Pain in thoracic spine: Secondary | ICD-10-CM | POA: Diagnosis not present

## 2016-07-06 DIAGNOSIS — M4726 Other spondylosis with radiculopathy, lumbar region: Secondary | ICD-10-CM | POA: Diagnosis not present

## 2016-07-06 DIAGNOSIS — M5136 Other intervertebral disc degeneration, lumbar region: Secondary | ICD-10-CM | POA: Diagnosis not present

## 2016-07-06 DIAGNOSIS — M5440 Lumbago with sciatica, unspecified side: Secondary | ICD-10-CM | POA: Diagnosis not present

## 2016-07-06 DIAGNOSIS — M5416 Radiculopathy, lumbar region: Secondary | ICD-10-CM | POA: Diagnosis not present

## 2016-07-14 ENCOUNTER — Other Ambulatory Visit: Payer: Self-pay | Admitting: Neurosurgery

## 2016-07-14 DIAGNOSIS — M5416 Radiculopathy, lumbar region: Secondary | ICD-10-CM

## 2016-08-02 ENCOUNTER — Ambulatory Visit
Admission: RE | Admit: 2016-08-02 | Discharge: 2016-08-02 | Disposition: A | Discharge status: Home / Self Care | Payer: Medicare Other | Source: Ambulatory Visit | Attending: Neurosurgery | Admitting: Neurosurgery

## 2016-08-02 DIAGNOSIS — M5416 Radiculopathy, lumbar region: Secondary | ICD-10-CM

## 2016-08-02 DIAGNOSIS — M4806 Spinal stenosis, lumbar region: Secondary | ICD-10-CM | POA: Diagnosis not present

## 2016-08-08 DIAGNOSIS — M4806 Spinal stenosis, lumbar region: Secondary | ICD-10-CM | POA: Diagnosis not present

## 2016-08-08 DIAGNOSIS — M5136 Other intervertebral disc degeneration, lumbar region: Secondary | ICD-10-CM | POA: Diagnosis not present

## 2016-08-08 DIAGNOSIS — M5440 Lumbago with sciatica, unspecified side: Secondary | ICD-10-CM | POA: Diagnosis not present

## 2016-08-08 DIAGNOSIS — M4726 Other spondylosis with radiculopathy, lumbar region: Secondary | ICD-10-CM | POA: Diagnosis not present

## 2016-08-08 DIAGNOSIS — M5416 Radiculopathy, lumbar region: Secondary | ICD-10-CM | POA: Diagnosis not present

## 2016-08-29 ENCOUNTER — Other Ambulatory Visit (HOSPITAL_COMMUNITY): Payer: Self-pay | Admitting: Internal Medicine

## 2016-08-29 DIAGNOSIS — Z1231 Encounter for screening mammogram for malignant neoplasm of breast: Secondary | ICD-10-CM

## 2016-09-28 ENCOUNTER — Ambulatory Visit (HOSPITAL_COMMUNITY)
Admission: RE | Admit: 2016-09-28 | Discharge: 2016-09-28 | Disposition: A | Discharge status: Home / Self Care | Payer: Medicare Other | Source: Ambulatory Visit | Attending: Internal Medicine | Admitting: Internal Medicine

## 2016-09-28 DIAGNOSIS — Z1231 Encounter for screening mammogram for malignant neoplasm of breast: Secondary | ICD-10-CM | POA: Diagnosis present

## 2016-09-29 ENCOUNTER — Ambulatory Visit (INDEPENDENT_AMBULATORY_CARE_PROVIDER_SITE_OTHER): Payer: Medicare Other | Admitting: Otolaryngology

## 2016-09-29 DIAGNOSIS — H9311 Tinnitus, right ear: Secondary | ICD-10-CM

## 2016-09-29 DIAGNOSIS — H9041 Sensorineural hearing loss, unilateral, right ear, with unrestricted hearing on the contralateral side: Secondary | ICD-10-CM

## 2017-01-12 DIAGNOSIS — J209 Acute bronchitis, unspecified: Secondary | ICD-10-CM | POA: Diagnosis not present

## 2017-01-27 ENCOUNTER — Encounter (HOSPITAL_COMMUNITY): Payer: Self-pay | Admitting: Emergency Medicine

## 2017-01-27 ENCOUNTER — Emergency Department (HOSPITAL_COMMUNITY): Payer: Medicare Other

## 2017-01-27 ENCOUNTER — Emergency Department (HOSPITAL_COMMUNITY)
Admission: EM | Admit: 2017-01-27 | Discharge: 2017-01-27 | Disposition: A | Discharge status: Home / Self Care | Payer: Medicare Other | Attending: Emergency Medicine | Admitting: Emergency Medicine

## 2017-01-27 DIAGNOSIS — I10 Essential (primary) hypertension: Secondary | ICD-10-CM | POA: Diagnosis not present

## 2017-01-27 DIAGNOSIS — Z79899 Other long term (current) drug therapy: Secondary | ICD-10-CM | POA: Insufficient documentation

## 2017-01-27 DIAGNOSIS — R0602 Shortness of breath: Secondary | ICD-10-CM | POA: Insufficient documentation

## 2017-01-27 DIAGNOSIS — R42 Dizziness and giddiness: Secondary | ICD-10-CM | POA: Diagnosis not present

## 2017-01-27 DIAGNOSIS — Z7982 Long term (current) use of aspirin: Secondary | ICD-10-CM | POA: Insufficient documentation

## 2017-01-27 DIAGNOSIS — J45909 Unspecified asthma, uncomplicated: Secondary | ICD-10-CM | POA: Insufficient documentation

## 2017-01-27 DIAGNOSIS — R002 Palpitations: Secondary | ICD-10-CM | POA: Diagnosis not present

## 2017-01-27 DIAGNOSIS — R0789 Other chest pain: Secondary | ICD-10-CM | POA: Diagnosis not present

## 2017-01-27 DIAGNOSIS — G8929 Other chronic pain: Secondary | ICD-10-CM

## 2017-01-27 DIAGNOSIS — R51 Headache: Secondary | ICD-10-CM | POA: Insufficient documentation

## 2017-01-27 DIAGNOSIS — R079 Chest pain, unspecified: Secondary | ICD-10-CM | POA: Diagnosis not present

## 2017-01-27 HISTORY — DX: Migraine, unspecified, not intractable, without status migrainosus: G43.909

## 2017-01-27 HISTORY — DX: Dorsalgia, unspecified: M54.9

## 2017-01-27 HISTORY — DX: Other chronic pain: G89.29

## 2017-01-27 HISTORY — DX: Other specified postprocedural states: Z98.890

## 2017-01-27 LAB — BASIC METABOLIC PANEL
ANION GAP: 7 (ref 5–15)
BUN: 17 mg/dL (ref 6–20)
CHLORIDE: 105 mmol/L (ref 101–111)
CO2: 28 mmol/L (ref 22–32)
Calcium: 9 mg/dL (ref 8.9–10.3)
Creatinine, Ser: 1.04 mg/dL — ABNORMAL HIGH (ref 0.44–1.00)
GFR calc non Af Amer: 53 mL/min — ABNORMAL LOW (ref >60)
Glucose, Bld: 115 mg/dL — ABNORMAL HIGH (ref 65–99)
POTASSIUM: 4.2 mmol/L (ref 3.5–5.1)
SODIUM: 140 mmol/L (ref 135–145)

## 2017-01-27 LAB — CBC
HEMATOCRIT: 38.4 % (ref 36.0–46.0)
HEMOGLOBIN: 12.7 g/dL (ref 12.0–15.0)
MCH: 31.8 pg (ref 26.0–34.0)
MCHC: 33.1 g/dL (ref 30.0–36.0)
MCV: 96.2 fL (ref 78.0–100.0)
Platelets: 187 10*3/uL (ref 150–400)
RBC: 3.99 MIL/uL (ref 3.87–5.11)
RDW: 13.5 % (ref 11.5–15.5)
WBC: 8 10*3/uL (ref 4.0–10.5)

## 2017-01-27 LAB — TROPONIN I: Troponin I: <0.03 ng/mL (ref <0.03)

## 2017-01-27 NOTE — ED Triage Notes (Signed)
Pt reports cp in center of chest radiating to right chest and jaw since December.  Pt also has been having h/a, dizziness, blurred vision, and sob since December as well. Pt alert and oriented at this time.

## 2017-01-27 NOTE — ED Notes (Signed)
Pt returned from X-ray.  

## 2017-01-27 NOTE — Discharge Instructions (Signed)
Avoid avoid caffinated products, such as teas, colas, coffee, chocolate. Avoid over the counter cold medicines, herbal or "natural vitamin" products, and illicit drugs because they can contain stimulants.  Take over the counter tylenol, as directed on packaging, as needed for discomfort. Call your regular medical doctor today to schedule a follow up appointment in the next 3 days. Call the Cardiologist today to schedule a follow up appointment within the next  week.  Return to the Emergency Department immediately if worsening.

## 2017-01-27 NOTE — ED Provider Notes (Signed)
[facility] Provider Note   CSN: [id number] Arrival date & time: 01/27/17  1026     History   Chief Complaint Chief Complaint  Patient presents with  . Chest Pain  . Shortness of Breath    HPI Brianna Manning is a 71 y.o. female.   Chest Pain   Associated symptoms include shortness of breath.  Shortness of Breath  Associated symptoms include chest pain.    Pt was seen at 1230. Per pt, c/o gradual onset and persistence of constant multiple symptoms for the past 2 to 3 months. Symptoms include: constant mid-sternal "aching" chest pain, SOB, palpitations, migraine headaches. Pt states she has been evaluated by her PMD for same, dx migraine. Denies any change in her symptoms over the past 2 to 3 months. Pt endorses hx of palpitations and SOB; placed on Holter monitor with reassuring results. Endorses hx of migraine headaches; describes the headache as per her usual chronic migraine headache pain pattern for many years.  Denies headache was sudden or maximal in onset or at any time.  Denies visual changes, no focal motor weakness, no tingling/numbness in extremities, no fevers, no neck pain, no rash, no cough, no abd pain, no N/V/D.     Past Medical History:  Diagnosis Date  . Asthma   . Chronic back pain   . History of Holter monitoring 2012   for palpitations and dyspnea; no acute findings  . Hypertension   . Migraine     There are no active problems to display for this patient.   Past Surgical History:  Procedure Laterality Date  . ABDOMINAL HYSTERECTOMY    . BREAST SURGERY     biopsy    OB History    No data available       Home Medications    Prior to Admission medications   Medication Sig Start Date End Date Taking? Authorizing Provider  aspirin EC 81 MG tablet Take 1-4 mg by mouth daily as needed for moderate pain.   Yes Historical Provider, MD  losartan-hydrochlorothiazide (HYZAAR) 50-12.5 MG per tablet Take 1 tablet by mouth daily.   Yes  Historical Provider, MD  nebivolol (BYSTOLIC) 5 MG tablet Take 5 mg by mouth daily.   Yes Historical Provider, MD  Omega-3 Fatty Acids (FISH OIL PO) Take 1 capsule by mouth daily.   Yes Historical Provider, MD  omeprazole (PRILOSEC) 20 MG capsule Take 20 mg by mouth daily. 08/13/15  Yes Historical Provider, MD  PROAIR HFA 108 (90 BASE) MCG/ACT inhaler Inhale 1-2 puffs into the lungs every 6 (six) hours as needed. Shortness of breath/wheezing 06/11/15  Yes Historical Provider, MD  HYDROcodone-acetaminophen (NORCO/VICODIN) 5-325 MG per tablet Take 2 tablets by mouth every 4 (four) hours as needed. Patient not taking: Reported on 01/27/2017 08/19/15   [name], MD    Family History History reviewed. No pertinent family history.  Social History Social History  Substance Use Topics  . Smoking status: Never Smoker  . Smokeless tobacco: Not on file  . Alcohol use No     Allergies   Patient has no known allergies.   Review of Systems Review of Systems  Respiratory: Positive for shortness of breath.   Cardiovascular: Positive for chest pain.  ROS: Statement: All systems negative except as marked or noted in the HPI; Constitutional: Negative for fever and chills. ; ; Eyes: Negative for eye pain, redness and discharge. ; ; ENMT: Negative for ear pain, hoarseness, nasal congestion, sinus pressure and sore  throat. ; ; Cardiovascular: +CP, SOB, palpitations. Negative for diaphoresis, and peripheral edema. ; ; Respiratory: Negative for cough, wheezing and stridor. ; ; Gastrointestinal: Negative for nausea, vomiting, diarrhea, abdominal pain, blood in stool, hematemesis, jaundice and rectal bleeding. . ; ; Genitourinary: Negative for dysuria, flank pain and hematuria. ; ; Musculoskeletal: Negative for back pain and neck pain. Negative for swelling and trauma.; ; Skin: Negative for pruritus, rash, abrasions, blisters, bruising and skin lesion.; ; Neuro: +migraine headache. Negative for lightheadedness and  neck stiffness. Negative for weakness, altered level of consciousness, altered mental status, extremity weakness, paresthesias, involuntary movement, seizure and syncope.       Physical Exam Updated Vital Signs BP 104/56   Pulse 65   Temp 99.1 F (37.3 C) (Oral)   Resp 10   Ht 5\' 4"  (1.626 m)   Wt 252 lb (114.3 kg)   SpO2 100%   BMI 43.26 kg/m   Physical Exam 1235: Physical examination:  Nursing notes reviewed; Vital signs and O2 SAT reviewed;  Constitutional: Well developed, Well nourished, Well hydrated, In no acute distress; Head:  Normocephalic, atraumatic; Eyes: EOMI, PERRL, No scleral icterus; ENMT: Mouth and pharynx normal, Mucous membranes moist; Neck: Supple, Full range of motion, No lymphadenopathy; Cardiovascular: Regular rate and rhythm, No gallop; Respiratory: Breath sounds clear & equal bilaterally, No wheezes.  Speaking full sentences with ease, Normal respiratory effort/excursion; Chest: Nontender, Movement normal; Abdomen: Soft, Nontender, Nondistended, Normal bowel sounds; Genitourinary: No CVA tenderness; Spine:  No midline CS, TS, LS tenderness.;; Extremities: Pulses normal, No tenderness, No edema, No calf edema or asymmetry.; Neuro: AA&Ox3, Major CN grossly intact. No facial droop. Speech clear. No gross focal motor or sensory deficits in extremities.; Skin: Color normal, Warm, Dry.   ED Treatments / Results  Labs (all labs ordered are listed, but only abnormal results are displayed)   EKG  EKG Interpretation  Date/Time:  Friday January 27 2017 10:35:18 EST Ventricular Rate:  73 PR Interval:  180 QRS Duration: 84 QT Interval:  398 QTC Calculation: 438 R Axis:   19 Text Interpretation:  Normal sinus rhythm Normal ECG When compared with ECG of 08/19/2015 No significant change was found Confirmed by [facility]  MD, Nicholos Johns [id number]) on 01/27/2017 12:38:39 PM       Radiology   Procedures Procedures (including critical care time)  Medications Ordered in  ED Medications - No data to display   Initial Impression / Assessment and Plan / ED Course  I have reviewed the triage vital signs and the nursing notes.  Pertinent labs & imaging results that were available during my care of the patient were reviewed by me and considered in my medical decision making (see chart for details).  MDM Reviewed: previous chart, nursing note and vitals Reviewed previous: labs, ECG and MRI Interpretation: labs, ECG, x-ray and CT scan   Results for orders placed or performed during the hospital encounter of 01/27/17  Basic metabolic panel  Result Value Ref Range   Sodium 140 135 - 145 mmol/L   Potassium 4.2 3.5 - 5.1 mmol/L   Chloride 105 101 - 111 mmol/L   CO2 28 22 - 32 mmol/L   Glucose, Bld 115 (H) 65 - 99 mg/dL   BUN 17 6 - 20 mg/dL   Creatinine, Ser 6.04 (H) 0.44 - 1.00 mg/dL   Calcium 9.0 8.9 - 54.0 mg/dL   GFR calc non Af Amer 53 (L) >60 mL/min   GFR calc Af Amer >60 >60 mL/min  Anion gap 7 5 - 15  CBC  Result Value Ref Range   WBC 8.0 4.0 - 10.5 K/uL   RBC 3.99 3.87 - 5.11 MIL/uL   Hemoglobin 12.7 12.0 - 15.0 g/dL   HCT 91.438.4 78.236.0 - 95.646.0 %   MCV 96.2 78.0 - 100.0 fL   MCH 31.8 26.0 - 34.0 pg   MCHC 33.1 30.0 - 36.0 g/dL   RDW 21.313.5 08.611.5 - 57.815.5 %   Platelets 187 150 - 400 K/uL  Troponin I  Result Value Ref Range   Troponin I <0.03 <0.03 ng/mL   Dg Chest 2 View Result Date: 01/27/2017 CLINICAL DATA:  Chest pain, shortness of breath. EXAM: CHEST  2 VIEW COMPARISON:  Radiographs of January 01, 2015. FINDINGS: The heart size and mediastinal contours are within normal limits. Both lungs are clear. No pneumothorax or pleural effusion is noted. The visualized skeletal structures are unremarkable. IMPRESSION: No active cardiopulmonary disease. Electronically Signed   By: Lupita RaiderJames  Green Jr, M.D.   On: 01/27/2017 11:55   Ct Head Wo Contrast  Result Date: 01/27/2017 CLINICAL DATA:  Dizziness and blurred vision EXAM: CT HEAD WITHOUT CONTRAST  TECHNIQUE: Contiguous axial images were obtained from the base of the skull through the vertex without intravenous contrast. COMPARISON:  MRI 03/02/2016 FINDINGS: Brain: Ventricle size normal. Cerebral volume normal for age. Negative for acute infarct. Negative for hemorrhage or mass. No edema or shift of the midline structures. Vascular: No hyperdense vessel or unexpected calcification. Skull: Negative Sinuses/Orbits: Negative Other: None IMPRESSION: Negative CT head. Electronically Signed   By: Marlan Palauharles  Clark M.D.   On: 01/27/2017 14:50     1540:   Doubt PE as cause for symptoms with low risk [place].  Doubt ACS as cause for symptoms with normal troponin and unchanged EKG from previous after 2 to 3 months of constant symptoms. Pt's VS remains stable. States she is ready to go home now. Encouraged to f/u with Cards MD and PMD for good continuity of care and control of her chronic symptoms. Pt verb understanding. Dx and testing d/w pt.  Questions answered.  Verb understanding, agreeable to d/c home with outpt f/u.       Final Clinical Impressions(s) / ED Diagnoses   Final diagnoses:  None    New Prescriptions New Prescriptions   No medications on file      Samuel JesterKathleen Cambell Rickenbach, DO 01/30/17 1542

## 2017-02-08 ENCOUNTER — Encounter: Payer: Self-pay | Admitting: *Deleted

## 2017-02-08 DIAGNOSIS — R0789 Other chest pain: Secondary | ICD-10-CM | POA: Diagnosis not present

## 2017-02-08 DIAGNOSIS — I10 Essential (primary) hypertension: Secondary | ICD-10-CM | POA: Diagnosis not present

## 2017-02-15 ENCOUNTER — Encounter: Payer: Self-pay | Admitting: Women's Health

## 2017-02-15 ENCOUNTER — Ambulatory Visit (INDEPENDENT_AMBULATORY_CARE_PROVIDER_SITE_OTHER): Payer: Medicare Other | Admitting: Women's Health

## 2017-02-15 VITALS — BP 162/80 | HR 60 | Ht 64.0 in | Wt 253.0 lb

## 2017-02-15 DIAGNOSIS — R350 Frequency of micturition: Secondary | ICD-10-CM

## 2017-02-15 DIAGNOSIS — Z780 Asymptomatic menopausal state: Secondary | ICD-10-CM

## 2017-02-15 DIAGNOSIS — L0591 Pilonidal cyst without abscess: Secondary | ICD-10-CM | POA: Diagnosis not present

## 2017-02-15 DIAGNOSIS — N898 Other specified noninflammatory disorders of vagina: Secondary | ICD-10-CM | POA: Diagnosis not present

## 2017-02-15 DIAGNOSIS — N3946 Mixed incontinence: Secondary | ICD-10-CM | POA: Diagnosis not present

## 2017-02-15 DIAGNOSIS — R102 Pelvic and perineal pain: Secondary | ICD-10-CM

## 2017-02-15 DIAGNOSIS — Z90710 Acquired absence of both cervix and uterus: Secondary | ICD-10-CM

## 2017-02-15 MED ORDER — SILVER SULFADIAZINE 1 % EX CREA: 1.0000 "application " | TOPICAL_CREAM | Freq: Two times a day (BID) | CUTANEOUS | 0 refills | Status: DC

## 2017-02-15 NOTE — Progress Notes (Signed)
   [facility] Visit  Patient name: Brianna Manning MRN [id number]  Date of birth: 04/22/1946  CC & HPI:  Brianna Manning is a 71 y.o. [id number] African American female presenting today as referral from PCP Dr. Felecia ShellingFanta for report of urinary frequency and urgency x ~8138yr worsening lately. No dysuria, hematuria. Does leak urine w/ coughing/sneezing and w/ urinary urgency. Total hysterectomy in 1980 for fibroids at age 89- she remembers them saying they 'took everything' but had doctor tell her years back she may have one ovary left. Vaginal pain and odor x few years, no itching/irritation. Not sexually active. Denies vaginal dryness. Does have HTN, tx by PCP. Does not smoke, no h/o  DVT/PE, CVA, MI, or breast cancer. Does have migraines w/ seeing spots. Goes to cardiologist in April for palpitations. Also reports 'spot' on tailbone area 'for years' comes and goes, no pain.  No LMP recorded. Patient has had a hysterectomy. The current method of family planning is s/p TAH, postmenopausal, abstinence Last pap 'a long time ago', had TAH @ 71yo  Pertinent History Reviewed:  Medical & Surgical Hx:   Past medical, surgical, family, and social history reviewed in electronic medical record Medications: Reviewed & Updated - see associated section Allergies: Reviewed in electronic medical record  Objective Findings:  Vitals: BP (!) 162/80 (BP Location: Right Arm, Patient Position: Sitting, Cuff Size: Large)   Pulse 60   Ht 5\' 4"  (1.626 m)   Wt 253 lb (114.8 kg)   BMI 43.43 kg/m  Body mass index is 43.43 kg/m.  Physical Examination: General appearance - alert, well appearing, and in no distress Pelvic - external genitalia appears normal, no cystocele appreciated, vaginal walls w/ loss of rugae c/w postmenopausal status, vaginal cuff appears intact, scant amt white nonodorous d/c Sacral area: small < 0.5cm open area, no s/s infection, appears to be pilondial cyst, no drainage noted  Results  for orders placed or performed in visit on 02/15/17 (from the past 24 hour(s))  POCT Wet Prep Mellody Drown(Wet [place])   Collection Time: 02/16/17 12:57 PM  Result Value Ref Range   Source Wet Prep POC vaginal    WBC, Wet Prep HPF POC none    Bacteria Wet Prep HPF POC None (A) Few   BACTERIA WET PREP MORPHOLOGY POC     Clue Cells Wet Prep HPF POC None None   Clue Cells Wet Prep Whiff POC Negative Whiff    Yeast Wet Prep HPF POC None    KOH Wet Prep POC     Trichomonas Wet Prep HPF POC Absent Absent     Assessment & Plan:  A:   Urinary frequency and urgency  Mixed stress & urgency incontinence  Vaginal pain and perceived odor  Sacral pilonidal cyst, non-infected  Postmenopausal   H/O hysterectomy for fibroids  P:  Send urine for culture  Rx silvadene bid for pilonidal cyst/open area  Kegel exercises for mixed incontinence  Empty bladder frequently, cut out all caffeine  Discussed w/ JAG, will try Luvena q 2-3d for other complaints right now  Return in about 4 weeks (around 03/15/2017) for F/U.  Marge DuncansBooker, Tetsuo Coppola Randall CNM, [facility]NP[facility]BC 02/16/2017 12:58 PM

## 2017-02-15 NOTE — Patient Instructions (Signed)
Luvena every 2-3 days Silvadene cream twice daily to bottom Empty bladder frequently, cut out all caffeine (soda, tea, coffee, chocolate)   Kegel Exercises Kegel exercises help strengthen the muscles that support the rectum, vagina, small intestine, bladder, and uterus. Doing Kegel exercises can help:  Improve bladder and bowel control.  Improve sexual response.  Reduce problems and discomfort during pregnancy. Kegel exercises involve squeezing your pelvic floor muscles, which are the same muscles you squeeze when you try to stop the flow of urine. The exercises can be done while sitting, standing, or lying down, but it is best to vary your position. Phase 1 exercises 1. Squeeze your pelvic floor muscles tight. You should feel a tight lift in your rectal area. If you are a female, you should also feel a tightness in your vaginal area. Keep your stomach, buttocks, and legs relaxed. 2. Hold the muscles tight for up to 10 seconds. 3. Relax your muscles. Repeat this exercise 50 times a day or as many times as told by your health care provider. Continue to do this exercise for at least 4-6 weeks or for as long as told by your health care provider. This information is not intended to replace advice given to you by your health care provider. Make sure you discuss any questions you have with your health care provider. Document Released: 11/14/2012 Document Revised: 07/23/2016 Document Reviewed: 10/18/2015 Elsevier Interactive Patient Education  2017 [employer]

## 2017-02-16 DIAGNOSIS — Z90710 Acquired absence of both cervix and uterus: Secondary | ICD-10-CM | POA: Insufficient documentation

## 2017-02-16 DIAGNOSIS — R350 Frequency of micturition: Secondary | ICD-10-CM | POA: Insufficient documentation

## 2017-02-16 DIAGNOSIS — L0591 Pilonidal cyst without abscess: Secondary | ICD-10-CM | POA: Insufficient documentation

## 2017-02-16 DIAGNOSIS — Z780 Asymptomatic menopausal state: Secondary | ICD-10-CM | POA: Insufficient documentation

## 2017-02-16 DIAGNOSIS — N3946 Mixed incontinence: Secondary | ICD-10-CM | POA: Insufficient documentation

## 2017-02-16 DIAGNOSIS — N898 Other specified noninflammatory disorders of vagina: Secondary | ICD-10-CM | POA: Insufficient documentation

## 2017-02-16 DIAGNOSIS — R102 Pelvic and perineal pain: Secondary | ICD-10-CM | POA: Insufficient documentation

## 2017-02-16 LAB — URINE CULTURE

## 2017-02-16 LAB — POCT WET PREP (WET MOUNT)
CLUE CELLS WET PREP WHIFF POC: NEGATIVE
Trichomonas Wet Prep HPF POC: ABSENT

## 2017-02-28 ENCOUNTER — Encounter: Payer: Medicare Other | Admitting: Cardiovascular Disease

## 2017-03-15 ENCOUNTER — Encounter (INDEPENDENT_AMBULATORY_CARE_PROVIDER_SITE_OTHER): Payer: Self-pay

## 2017-03-15 ENCOUNTER — Ambulatory Visit (INDEPENDENT_AMBULATORY_CARE_PROVIDER_SITE_OTHER): Payer: Medicare Other | Admitting: Women's Health

## 2017-03-15 ENCOUNTER — Encounter: Payer: Self-pay | Admitting: Women's Health

## 2017-03-15 VITALS — BP 148/82 | HR 72 | Ht 64.0 in | Wt 255.0 lb

## 2017-03-15 DIAGNOSIS — R3915 Urgency of urination: Secondary | ICD-10-CM | POA: Diagnosis not present

## 2017-03-15 LAB — POCT URINALYSIS DIPSTICK
Blood, UA: NEGATIVE
GLUCOSE UA: NEGATIVE
KETONES UA: NEGATIVE
LEUKOCYTES UA: NEGATIVE
Nitrite, UA: NEGATIVE
Protein, UA: NEGATIVE

## 2017-03-15 NOTE — Patient Instructions (Signed)
Use premarin cream 0.5g every night for 2 weeks, then decrease to 2 times per week. Will take 2-4 weeks before you notice any difference

## 2017-03-15 NOTE — Progress Notes (Signed)
   [facility] Visit  Patient name: Brianna Manning MRN [id number]  Date of birth: [birth year]  CC & HPI:  Brianna Manning is a 71 y.o.  African American female presenting today for f/u, still has pilonidal cyst, has been using silvadene cream bid as directed, no flares, no pain or sx of infection. Has been there for years- occ has flares. Also still having urinary urgency/frequency, some incontinence w/ urge or coughing/sneezing. Has not tried [place] as directed d/t cost. Urine culture was negative last visit, urine dipstick today negative as well. S/P hysterectomy for fibroids/menorrhagia, no h/o abnormal paps, endometriosis, breast ca, DVT/PE, MI/CVA. Does have HTN, fairly controlled on meds.  No LMP recorded. Patient has had a hysterectomy.  Pertinent History Reviewed:  Medical & Surgical Hx:   Past medical, surgical, family, and social history reviewed in electronic medical record Medications: Reviewed & Updated - see associated section Allergies: Reviewed in electronic medical record  Objective Findings:  Vitals: BP (!) 148/82 (BP Location: Right Arm, Patient Position: Sitting, Cuff Size: Large)   Pulse 72   Ht  (1.626 m)   Wt 255 lb (115.7 kg)   BMI 43.77 kg/m  Body mass index is 43.77 kg/m.  Physical Examination: General appearance - alert, well appearing, and in no distress Pilonidal cyst: at coccyx, no s/s infection  Results for orders placed or performed in visit on 03/15/17 (from the past 24 hour(s))  POCT urinalysis dipstick   Collection Time: 03/15/17  1:47 PM  Result Value Ref Range   Color, UA     Clarity, UA     Glucose, UA neg    Bilirubin, UA     Ketones, UA neg    Spec Grav, UA  1.030 - 1.035   Blood, UA neg    pH, UA  5.0 - 8.0   Protein, UA neg    Urobilinogen, UA  Negative - 2.0   Nitrite, UA neg    Leukocytes, UA Negative Negative     Assessment & Plan:  A:   Chronic pilonidal cyst  Urinary urgency/frequency r/t atrophic  postmenopausal status  Mixed incontinece  P:  Discussed w/ JVF, needs referral to general surgery for pilonidal cyst, recommends premarin 0.5g q hs x 2wks, then 2x/wk thereafter, can take 2-4wks to start noticing improvement in sx  Referral to Dr. Lovell Sheehan sent, pt to expect call from their office to set up appt  Return in about 4 weeks (around 04/12/2017) for F/U w/ Dr. Erma Pinto.  Marge Duncans CNM, [facility] 03/15/2017 3:41 PM

## 2017-03-21 ENCOUNTER — Ambulatory Visit (INDEPENDENT_AMBULATORY_CARE_PROVIDER_SITE_OTHER): Payer: Medicare Other | Admitting: Cardiology

## 2017-03-21 ENCOUNTER — Encounter: Payer: Self-pay | Admitting: Cardiology

## 2017-03-21 VITALS — BP 144/86 | HR 80 | Ht 64.0 in | Wt 249.0 lb

## 2017-03-21 DIAGNOSIS — R002 Palpitations: Secondary | ICD-10-CM

## 2017-03-21 DIAGNOSIS — I10 Essential (primary) hypertension: Secondary | ICD-10-CM | POA: Diagnosis not present

## 2017-03-21 DIAGNOSIS — R0602 Shortness of breath: Secondary | ICD-10-CM

## 2017-03-21 DIAGNOSIS — R0789 Other chest pain: Secondary | ICD-10-CM | POA: Diagnosis not present

## 2017-03-21 MED ORDER — NEBIVOLOL HCL 5 MG PO TABS: 7.5000 mg | ORAL_TABLET | Freq: Every day | ORAL | 6 refills | Status: DC

## 2017-03-21 NOTE — Patient Instructions (Signed)
Medication Instructions:  INCREASE BYSTOLIC TO 7.5 MG DAILY  Labwork: NONE  Testing/Procedures: Your physician has requested that you have an echocardiogram. Echocardiography is a painless test that uses sound waves to create images of your heart. It provides your doctor with information about the size and shape of your heart and how well your heart's chambers and valves are working. This procedure takes approximately one hour. There are no restrictions for this procedure.    Follow-Up: Your physician recommends that you schedule a follow-up appointment in: 6 WEEKS    Any Other Special Instructions Will Be Listed Below (If Applicable).     If you need a refill on your cardiac medications before your next appointment, please call your pharmacy.

## 2017-03-21 NOTE — Progress Notes (Signed)
Clinical Summary Brianna Manning is a 71 y.o.female seen as new patient. She is referred by Dr Felecia Shelling for chest pain.   1. Chest pain - ER visit 01/27/17 with chest pain and SOB - negative enzymes, EKG nonacute. CXR no acute process  - sharp pain under left breast, 7/10 in severity. Can occur at rest or with activity. No other associated symptoms. Can be worst with position, laying on left side can make worst. Lasts just a few seconds. Occurs about once a month - has some DOE that has increased - can have some swelling in legs CAD risk factors, HTN, mother and father with heart troubles.     2. Palpitations - occurs daily. Lasts few minutes - tends to come on with activity. No other associated symptoms - ongoing several years.  - occasional coffee, green tea occasionally, no sodas, no EtOH    Past Medical History:  Diagnosis Date  . Asthma   . Chronic back pain   . History of Holter monitoring 2012   for palpitations and dyspnea; no acute findings  . Hypertension   . Migraine      No Known Allergies   Current Outpatient Prescriptions  Medication Sig Dispense Refill  . aspirin EC 81 MG tablet Take 1-4 mg by mouth daily as needed for moderate pain.    Marland Kitchen HYDROcodone-acetaminophen (NORCO/VICODIN) 5-325 MG per tablet Take 2 tablets by mouth every 4 (four) hours as needed. (Patient not taking: Reported on 01/27/2017) 6 tablet 0  . losartan-hydrochlorothiazide (HYZAAR) 50-12.5 MG per tablet Take 1 tablet by mouth daily.    . nebivolol (BYSTOLIC) 5 MG tablet Take 5 mg by mouth daily.    . Omega-3 Fatty Acids (FISH OIL PO) Take 1 capsule by mouth daily.    Marland Kitchen omeprazole (PRILOSEC) 20 MG capsule Take 20 mg by mouth daily.    Marland Kitchen PROAIR HFA 108 (90 BASE) MCG/ACT inhaler Inhale 1-2 puffs into the lungs every 6 (six) hours as needed. Shortness of breath/wheezing    . silver sulfADIAZINE (SILVADENE) 1 % cream Apply 1 application topically 2 (two) times daily. (Patient not taking:  Reported on 03/15/2017) 50 g 0   No current facility-administered medications for this visit.      Past Surgical History:  Procedure Laterality Date  . ABDOMINAL HYSTERECTOMY    . BREAST SURGERY     biopsy     No Known Allergies    Family History  Problem Relation Age of Onset  . Stroke Father   . Heart disease Mother   . Hypertension Mother   . Heart attack Mother   . Seizures Brother   . Hypertension Brother   . Diabetes Sister   . Hypertension Sister   . Heart disease Sister   . Breast cancer Sister   . Diabetes Sister   . Hypertension Sister      Social History Ms. Meland reports that she has never smoked. She has never used smokeless tobacco. Ms. Routson reports that she does not drink alcohol.   Review of Systems CONSTITUTIONAL: No weight loss, fever, chills, weakness or fatigue.  HEENT: Eyes: No visual loss, blurred vision, double vision or yellow sclerae.No hearing loss, sneezing, congestion, runny nose or sore throat.  SKIN: No rash or itching.  CARDIOVASCULAR: per HPI RESPIRATORY: No shortness of breath, cough or sputum.  GASTROINTESTINAL: No anorexia, nausea, vomiting or diarrhea. No abdominal pain or blood.  GENITOURINARY: No burning on urination, no polyuria NEUROLOGICAL: No headache, dizziness,  syncope, paralysis, ataxia, numbness or tingling in the extremities. No change in bowel or bladder control.  MUSCULOSKELETAL: No muscle, back pain, joint pain or stiffness.  LYMPHATICS: No enlarged nodes. No history of splenectomy.  PSYCHIATRIC: No history of depression or anxiety.  ENDOCRINOLOGIC: No reports of sweating, cold or heat intolerance. No polyuria or polydipsia.  Marland Kitchen   Physical Examination Vitals:   03/21/17 1432  BP: (!) 144/86  Pulse: 80   Vitals:   03/21/17 1432  Weight: 249 lb (112.9 kg)  Height:  (1.626 m)    Gen: resting comfortably, no acute distress HEENT: no scleral icterus, pupils equal round and reactive, no palptable  cervical adenopathy,  CV: RRR, no m/rg, no jvd Resp: Clear to auscultation bilaterally GI: abdomen is soft, non-tender, non-distended, normal bowel sounds, no hepatosplenomegaly MSK: extremities are warm, no edema.  Skin: warm, no rash Neuro:  no focal deficits Psych: appropriate affect     Assessment and Plan  1. Chest pain - atypical symptoms, worst with position and lying on left side - given her SOB and occasional LE edema will obtain echo. Would not plan ischemic testing at this time  2. Palpitations - wean caffeine, we will increase her bystolic to 7.5mg  daily - consider monitor if symptoms progress. - check pcp labs, if no TSH included will need at next visti  3. HTN - above goal in clinic, increase bystolic as described above   F/u 6 weeks.       Antoine Poche, M.D.

## 2017-03-30 ENCOUNTER — Ambulatory Visit (HOSPITAL_COMMUNITY)
Admission: RE | Admit: 2017-03-30 | Discharge: 2017-03-30 | Disposition: A | Discharge status: Home / Self Care | Payer: Medicare Other | Source: Ambulatory Visit | Attending: Cardiovascular Disease | Admitting: Cardiovascular Disease

## 2017-03-30 DIAGNOSIS — I10 Essential (primary) hypertension: Secondary | ICD-10-CM | POA: Diagnosis not present

## 2017-03-30 DIAGNOSIS — Z8249 Family history of ischemic heart disease and other diseases of the circulatory system: Secondary | ICD-10-CM | POA: Diagnosis not present

## 2017-03-30 DIAGNOSIS — R0602 Shortness of breath: Secondary | ICD-10-CM | POA: Insufficient documentation

## 2017-03-30 LAB — ECHOCARDIOGRAM COMPLETE
AVLVOTPG: 3 mmHg
CHL CUP DOP CALC LVOT VTI: 19.3 cm
CHL CUP MV DEC (S): 535
CHL CUP STROKE VOLUME: 31 mL
E decel time: 535 msec
E/e' ratio: 5.57
FS: 36 % (ref 28–44)
IVS/LV PW RATIO, ED: 1.08
LA ID, A-P, ES: 37 mm
LA diam end sys: 37 mm
LADIAMINDEX: 1.59 cm/m2
LAVOL: 37.1 mL
LAVOLA4C: 35.4 mL
LAVOLIN: 16 mL/m2
LV SIMPSON'S DISK: 59
LV TDI E'MEDIAL: 4.46
LV dias vol index: 22 mL/m2
LV dias vol: 52 mL (ref 46–106)
LV e' LATERAL: 6.53 cm/s
LV sys vol index: 9 mL/m2
LVEEAVG: 5.57
LVEEMED: 5.57
LVOT SV: 67 mL
LVOT area: 3.46 cm2
LVOT diameter: 21 mm
LVOTPV: 83.6 cm/s
LVSYSVOL: 21 mL (ref 14–42)
Lateral S' vel: 13.1 cm/s
MV pk A vel: 77.7 m/s
MVPKEVEL: 36.4 m/s
PW: 11.5 mm — AB (ref 0.6–1.1)
TAPSE: 20.7 mm
TDI e' lateral: 6.53

## 2017-03-30 NOTE — Progress Notes (Signed)
*  PRELIMINARY RESULTS* Echocardiogram 2D Echocardiogram has been performed.  Stacey Drain 03/30/2017, 12:02 PM

## 2017-04-04 ENCOUNTER — Encounter: Payer: Self-pay | Admitting: General Surgery

## 2017-04-04 ENCOUNTER — Ambulatory Visit (INDEPENDENT_AMBULATORY_CARE_PROVIDER_SITE_OTHER): Payer: Medicare Other | Admitting: General Surgery

## 2017-04-04 VITALS — BP 139/62 | HR 69 | Temp 98.4°F | Resp 18 | Ht 64.0 in | Wt 249.0 lb

## 2017-04-04 DIAGNOSIS — L0591 Pilonidal cyst without abscess: Secondary | ICD-10-CM | POA: Diagnosis not present

## 2017-04-04 NOTE — Progress Notes (Signed)
Brianna Manning; [id number]; 10/22/1946   HPI Patient is a 71 year old black female who was referred to my care by Dr. Emelda Fear for evaluation treatment of a pilonidal cyst. Patient states she has had intermittent episodes of irritation over the pilonidal region for many years. She has never had surgery or required incision and drainage. She says that sometimes she does not let it heal well as she picks at it. She has no drainage. She has been on intermittent antibiotics in the past for inflammation in this region. Past Medical History:  Diagnosis Date  . Asthma   . Chronic back pain   . History of Holter monitoring 2012   for palpitations and dyspnea; no acute findings  . Hypertension   . Migraine     Past Surgical History:  Procedure Laterality Date  . ABDOMINAL HYSTERECTOMY    . BREAST SURGERY     biopsy    Family History  Problem Relation Age of Onset  . Stroke Father   . Heart disease Mother   . Hypertension Mother   . Heart attack Mother   . Seizures Brother   . Hypertension Brother   . Diabetes Sister   . Hypertension Sister   . Heart disease Sister   . Breast cancer Sister   . Diabetes Sister   . Hypertension Sister     Current Outpatient Prescriptions on File Prior to Visit  Medication Sig Dispense Refill  . aspirin EC 81 MG tablet Take 1-4 mg by mouth daily as needed for moderate pain.    Marland Kitchen losartan-hydrochlorothiazide (HYZAAR) 50-12.5 MG per tablet Take 1 tablet by mouth daily.    . nebivolol (BYSTOLIC) 5 MG tablet Take 1.5 tablets (7.5 mg total) by mouth daily. 45 tablet 6  . Omega-3 Fatty Acids (FISH OIL PO) Take 1 capsule by mouth daily.    Marland Kitchen omeprazole (PRILOSEC) 20 MG capsule Take 20 mg by mouth daily.    . silver sulfADIAZINE (SILVADENE) 1 % cream Apply 1 application topically 2 (two) times daily. 50 g 0   No current facility-administered medications on file prior to visit.     No Known Allergies  History  Alcohol Use No    History  Smoking  Status  . Never Smoker  Smokeless Tobacco  . Never Used    Review of Systems  Constitutional: Positive for malaise/fatigue.  HENT: Positive for sore throat.   Eyes: Positive for pain.  Respiratory: Positive for cough and shortness of breath. Negative for sputum production.   Cardiovascular: Positive for chest pain.  Gastrointestinal: Positive for heartburn.  Genitourinary: Positive for frequency.  Musculoskeletal: Positive for back pain, joint pain and neck pain.  Skin: Positive for itching.  Neurological: Positive for tingling and tremors.  Endo/Heme/Allergies: Negative.   Psychiatric/Behavioral: Negative.     Objective   Vitals:   04/04/17 1453  BP: 139/62  Pulse: 69  Resp: 18  Temp: 98.4 F (36.9 C)    Physical Exam  Constitutional: She is well-developed, well-nourished, and in no distress.  HENT:  Head: Normocephalic and atraumatic.  Neck: Normal range of motion. Neck supple.  Cardiovascular: Normal rate, regular rhythm and normal heart sounds.   No murmur heard. Pulmonary/Chest: Effort normal and breath sounds normal. She has no wheezes. She has no rales.  Skin: Skin is dry.  Small superficial less than 5 mm area of of skin excoriation over the pilonidal region. No drainage, induration, or erythema noted. No drainage is noted.  Vitals reviewed.  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 I did give her some triple antibiotic ointment to help the wound heal. She was instructed to return should she develop drainage or significant pain. She does not want surgery unless actually necessary, which is fine with me.

## 2017-04-04 NOTE — Patient Instructions (Signed)
Pilonidal Cyst A pilonidal cyst is a fluid-filled sac. It forms beneath the skin near your tailbone, at the top of the crease of your buttocks. A pilonidal cyst that is not large or infected may not cause symptoms or problems. If the cyst becomes irritated or infected, it may fill with pus. This causes pain and swelling (pilonidal abscess). An infected cyst may need to be treated with medicine, drained, or removed. What are the causes? The cause of a pilonidal cyst is not known. One cause may be a hair that grows into your skin (ingrown hair). What increases the risk? Pilonidal cysts are more common in boys and men. Risk factors include:  Having lots of hair near the crease of the buttocks.  Being overweight.  Having a pilonidal dimple.  Wearing tight clothing.  Not bathing or showering frequently.  Sitting for long periods of time. What are the signs or symptoms? Signs and symptoms of a pilonidal cyst may include:  Redness.  Pain and tenderness.  Warmth.  Swelling.  Pus.  Fever. How is this diagnosed? Your health care provider may diagnose a pilonidal cyst based on your symptoms and a physical exam. The health care provider may do a blood test to check for infection. If your cyst is draining pus, your health care provider may take a sample of the drainage to be tested at a laboratory. How is this treated? Surgery is the usual treatment for an infected pilonidal cyst. You may also have to take medicines before surgery. The type of surgery you have depends on the size and severity of the infected cyst. The different kinds of surgery include:  Incision and drainage. This is a procedure to open and drain the cyst.  Marsupialization. In this procedure, a large cyst or abscess may be opened and kept open by stitching the edges of the skin to the cyst walls.  Cyst removal. This procedure involves opening the skin and removing all or part of the cyst. Follow these instructions at  home:  Follow all of your surgeon's instructions carefully if you had surgery.  Take medicines only as directed by your health care provider.  If you were prescribed an antibiotic medicine, finish it all even if you start to feel better.  Keep the area around your pilonidal cyst clean and dry.  Clean the area as directed by your health care provider. Pat the area dry with a clean towel. Do not rub it as this may cause bleeding.  Remove hair from the area around the cyst as directed by your health care provider.  Do not wear tight clothing or sit in one place for long periods of time.  There are many different ways to close and cover an incision, including stitches, skin glue, and adhesive strips. Follow your health care provider's instructions on:  Incision care.  Bandage (dressing) changes and removal.  Incision closure removal. Contact a health care provider if:  You have drainage, redness, swelling, or pain at the site of the cyst.  You have a fever. This information is not intended to replace advice given to you by your health care provider. Make sure you discuss any questions you have with your health care provider. Document Released: 11/25/2000 Document Revised: 05/05/2016 Document Reviewed: 04/17/2014 Elsevier Interactive Patient Education  2017 [employer] Inc[employer]  

## 2017-04-12 ENCOUNTER — Ambulatory Visit: Payer: Medicare Other | Admitting: Obstetrics and Gynecology

## 2017-04-26 ENCOUNTER — Ambulatory Visit: Payer: Medicare Other | Admitting: Obstetrics and Gynecology

## 2017-04-27 ENCOUNTER — Encounter: Payer: Self-pay | Admitting: Obstetrics and Gynecology

## 2017-04-27 ENCOUNTER — Ambulatory Visit (INDEPENDENT_AMBULATORY_CARE_PROVIDER_SITE_OTHER): Payer: Medicare Other | Admitting: Obstetrics and Gynecology

## 2017-04-27 VITALS — BP 122/70 | HR 78 | Wt 250.0 lb

## 2017-04-27 DIAGNOSIS — L0591 Pilonidal cyst without abscess: Secondary | ICD-10-CM

## 2017-04-27 NOTE — Progress Notes (Signed)
Patient ID: Brianna Manning, female   DOB: 12/24/1945, 71 y.o.   MRN: [id number]    [facility] Visit  @DATE @            Patient name: Brianna Hockingudrey Y Dress MRN [id number]  Date of birth: 10/29/1946  CC & HPI:   Chief Complaint  Patient presents with  . Follow-up     Brianna Hockingudrey Y Pryde is a 71 y.o. female presenting today for f/u of pilonidal cyst. Pt was seen by Dr. 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 Pt states she is amenable to waiting on surgery until necessary, as noted in Dr. Lovell SheehanJenkins plan.   ROS:  ROS No complaints   Pertinent History Reviewed:   Reviewed: Significant for abdominal hysterectomy, pilonidal cyst Medical         Past Medical History:  Diagnosis Date  . Asthma   . Chronic back pain   . History of Holter monitoring 2012   for palpitations and dyspnea; no acute findings  . Hypertension   . Migraine                               Surgical Hx:    Past Surgical History:  Procedure Laterality Date  . ABDOMINAL HYSTERECTOMY    . BREAST SURGERY     biopsy   Medications: Reviewed & Updated - see associated section                       Current Outpatient Prescriptions:  .  albuterol (PROVENTIL HFA;VENTOLIN HFA) 108 (90 Base) MCG/ACT inhaler, Inhale 2 puffs into the lungs every 6 (six) hours as needed for wheezing or shortness of breath., Disp: , Rfl:  .  aspirin EC 81 MG tablet, Take 1-4 mg by mouth daily as needed for moderate pain., Disp: , Rfl:  .  losartan-hydrochlorothiazide (HYZAAR) 50-12.5 MG per tablet, Take 1 tablet by mouth daily., Disp: , Rfl:  .  nebivolol (BYSTOLIC) 5 MG tablet, Take 1.5 tablets (7.5 mg total) by mouth daily., Disp: 45 tablet, Rfl: 6 .  Omega-3 Fatty Acids (FISH OIL PO), Take 1 capsule by mouth daily., Disp: , Rfl:  .  omeprazole (PRILOSEC) 20 MG capsule, Take 20 mg by  mouth daily., Disp: , Rfl:  .  silver sulfADIAZINE (SILVADENE) 1 % cream, Apply 1 application topically 2 (two) times daily., Disp: 50 g, Rfl: 0   Social History: Reviewed -  reports that she has never smoked. She has never used smokeless tobacco.  Objective Findings:  Vitals: Blood pressure 122/70, pulse 78, weight 250 lb (113.4 kg).  Physical Examination: Discussion only   Assessment & Plan:   A:  1. F/u of Pilonidal cyst   P:  1. F/u with Dr. Lovell SheehanJenkins and this office PRN     By signing my name below, I, Doreatha MartinEva Mathews, attest that this documentation has been prepared under the direction and in the presence of Tilda BurrowFerguson, Adrie Picking V, MD. Electronically Signed: Doreatha MartinEva Mathews, ED Scribe. 04/27/17. 1:55 PM.  I personally performed the services described in this documentation, which was SCRIBED in my presence. The recorded information has been reviewed and considered accurate. It has been edited as necessary during review. Tilda BurrowFERGUSON,Kyriaki Moder V, MD

## 2017-05-02 ENCOUNTER — Ambulatory Visit: Payer: Medicare Other | Admitting: Cardiology

## 2017-05-23 ENCOUNTER — Ambulatory Visit (HOSPITAL_COMMUNITY)
Admission: RE | Admit: 2017-05-23 | Discharge: 2017-05-23 | Disposition: A | Discharge status: Home / Self Care | Payer: Medicare Other | Source: Ambulatory Visit | Attending: Internal Medicine | Admitting: Internal Medicine

## 2017-05-23 ENCOUNTER — Other Ambulatory Visit (HOSPITAL_COMMUNITY): Payer: Self-pay | Admitting: Internal Medicine

## 2017-05-23 DIAGNOSIS — Z1231 Encounter for screening mammogram for malignant neoplasm of breast: Secondary | ICD-10-CM

## 2017-06-02 ENCOUNTER — Telehealth: Payer: Self-pay

## 2017-06-02 MED ORDER — METOPROLOL TARTRATE 50 MG PO TABS: 50.0000 mg | ORAL_TABLET | Freq: Two times a day (BID) | ORAL | 3 refills | Status: DC

## 2017-06-02 NOTE — Telephone Encounter (Signed)
Notified pt,will stp bystolic and start lopressor

## 2017-06-02 NOTE — Telephone Encounter (Signed)
-----   Message from Antoine PocheJonathan F Branch, MD sent at 06/02/2017  3:22 PM EDT ----- Can you let pt know we received notice from her insurance that they will not cover bysotlic. Please stop bysotlic, start lopressor 50mg  bid   Dominga FerryJ Branch MD

## 2017-06-02 NOTE — Telephone Encounter (Signed)
E-scribed lopressor to pharmacy, LMTCB at both home and cell numbers-cc

## 2017-06-15 ENCOUNTER — Encounter: Payer: Self-pay | Admitting: Cardiology

## 2017-06-15 ENCOUNTER — Ambulatory Visit (INDEPENDENT_AMBULATORY_CARE_PROVIDER_SITE_OTHER): Payer: Medicare Other | Admitting: Cardiology

## 2017-06-15 VITALS — BP 116/66 | HR 69 | Ht 64.0 in | Wt 253.0 lb

## 2017-06-15 DIAGNOSIS — R002 Palpitations: Secondary | ICD-10-CM

## 2017-06-15 DIAGNOSIS — R0609 Other forms of dyspnea: Secondary | ICD-10-CM

## 2017-06-15 DIAGNOSIS — R0789 Other chest pain: Secondary | ICD-10-CM

## 2017-06-15 DIAGNOSIS — R7309 Other abnormal glucose: Secondary | ICD-10-CM

## 2017-06-15 DIAGNOSIS — E785 Hyperlipidemia, unspecified: Secondary | ICD-10-CM

## 2017-06-15 NOTE — Patient Instructions (Signed)
Medication Instructions:  Your physician recommends that you continue on your current medications as directed. Please refer to the Current Medication list given to you today.   Labwork: ASAP   Testing/Procedures: NONE  Follow-Up: Your physician wants you to follow-up in: 6 MONTHS.  You will receive a reminder letter in the mail two months in advance. If you don't receive a letter, please call our office to schedule the follow-up appointment.   Any Other Special Instructions Will Be Listed Below (If Applicable).     If you need a refill on your cardiac medications before your next appointment, please call your pharmacy.   

## 2017-06-15 NOTE — Progress Notes (Signed)
Clinical Summary [name] is a 71 y.o.female seen today for follow up of the following medical problems.   1. Chest pain - ER visit 01/27/17 with chest pain and SOB - negative enzymes, EKG nonacute. CXR no acute process   - 03/2017 echo LVEF 60-65%, no WMAs, grade I diastolic dysfunction.  - still with some sharp pains at times. Less intense than before.  - breathing unchanged since last visit. Stable DOE with activities.   2. Palpitations - no recent symptoms   Past Medical History:  Diagnosis Date  . Asthma   . Chronic back pain   . History of Holter monitoring 2012   for palpitations and dyspnea; no acute findings  . Hypertension   . Migraine      No Known Allergies   Current Outpatient Prescriptions  Medication Sig Dispense Refill  . albuterol (PROVENTIL HFA;VENTOLIN HFA) 108 (90 Base) MCG/ACT inhaler Inhale 2 puffs into the lungs every 6 (six) hours as needed for wheezing or shortness of breath.    Marland Kitchen. aspirin EC 81 MG tablet Take 1-4 mg by mouth daily as needed for moderate pain.    Marland Kitchen. losartan-hydrochlorothiazide (HYZAAR) 50-12.5 MG per tablet Take 1 tablet by mouth daily.    . metoprolol tartrate (LOPRESSOR) 50 MG tablet Take 1 tablet (50 mg total) by mouth 2 (two) times daily. 180 tablet 3  . Omega-3 Fatty Acids (FISH OIL PO) Take 1 capsule by mouth daily.    Marland Kitchen. omeprazole (PRILOSEC) 20 MG capsule Take 20 mg by mouth daily.    . silver sulfADIAZINE (SILVADENE) 1 % cream Apply 1 application topically 2 (two) times daily. 50 g 0   No current facility-administered medications for this visit.      Past Surgical History:  Procedure Laterality Date  . ABDOMINAL HYSTERECTOMY    . BREAST SURGERY     biopsy     No Known Allergies    Family History  Problem Relation Age of Onset  . Stroke Father   . Heart disease Mother   . Hypertension Mother   . Heart attack Mother   . Seizures Brother   . Hypertension Brother   . Diabetes Sister   .  Hypertension Sister   . Heart disease Sister   . Breast cancer Sister   . Diabetes Sister   . Hypertension Sister      Social History [name] reports that she has never smoked. She has never used smokeless tobacco. [name] reports that she does not drink alcohol.   Review of Systems CONSTITUTIONAL: No weight loss, fever, chills, weakness or fatigue.  HEENT: Eyes: No visual loss, blurred vision, double vision or yellow sclerae.No hearing loss, sneezing, congestion, runny nose or sore throat.  SKIN: No rash or itching.  CARDIOVASCULAR: per hpi RESPIRATORY: No shortness of breath, cough or sputum.  GASTROINTESTINAL: No anorexia, nausea, vomiting or diarrhea. No abdominal pain or blood.  GENITOURINARY: No burning on urination, no polyuria NEUROLOGICAL: No headache, dizziness, syncope, paralysis, ataxia, numbness or tingling in the extremities. No change in bowel or bladder control.  MUSCULOSKELETAL: No muscle, back pain, joint pain or stiffness.  LYMPHATICS: No enlarged nodes. No history of splenectomy.  PSYCHIATRIC: No history of depression or anxiety.  ENDOCRINOLOGIC: No reports of sweating, cold or heat intolerance. No polyuria or polydipsia.  Marland Kitchen.   Physical Examination Vitals:   06/15/17 1011  BP: 116/66  Pulse: 69   Vitals:   06/15/17 1011  Weight: 253 lb (  114.8 kg)  Height: 5\' 4"  (1.626 m)    Gen: resting comfortably, no acute distress HEENT: no scleral icterus, pupils equal round and reactive, no palptable cervical adenopathy,  CV: RRR, no m/r/g, no jvd Resp: Clear to auscultation bilaterally GI: abdomen is soft, non-tender, non-distended, normal bowel sounds, no hepatosplenomegaly MSK: extremities are warm, no edema.  Skin: warm, no rash Neuro:  no focal deficits Psych: appropriate affect   Diagnostic Studies 03/2017 echo Study Conclusions  - Left ventricle: The cavity size was normal. Wall thickness was   increased in a pattern of mild LVH.  Systolic function was normal.   The estimated ejection fraction was in the range of 60% to 65%.   Wall motion was normal; there were no regional wall motion   abnormalities. Doppler parameters are consistent with abnormal   left ventricular relaxation (grade 1 diastolic dysfunction). - Aortic valve: Valve area (VTI): 2.87 cm^2. Valve area (Vmax):   2.98 cm^2. Valve area (Vmean): 2.45 cm^2. - Atrial septum: No defect or patent foramen ovale was identified. - Technically adquate study.    Assessment and Plan  1. Chest pain - atypical symptoms, worst with position and lying on left side - symptoms imrpoved since last visit, normal echo - no plans for ischemic testing at this time.   2. Palpitations - doing well on beta blocker, we will conitnue  3. HTN - at goal, conitnue current meds      Antoine Poche, M.D.

## 2017-06-19 DIAGNOSIS — E785 Hyperlipidemia, unspecified: Secondary | ICD-10-CM | POA: Diagnosis not present

## 2017-06-19 DIAGNOSIS — D649 Anemia, unspecified: Secondary | ICD-10-CM | POA: Diagnosis not present

## 2017-06-19 DIAGNOSIS — R002 Palpitations: Secondary | ICD-10-CM | POA: Diagnosis not present

## 2017-06-19 DIAGNOSIS — R7309 Other abnormal glucose: Secondary | ICD-10-CM | POA: Diagnosis not present

## 2017-06-19 LAB — CBC WITH DIFFERENTIAL/PLATELET
BASOS ABS: 98 {cells}/uL (ref 0–200)
Basophils Relative: 1 %
EOS ABS: 294 {cells}/uL (ref 15–500)
Eosinophils Relative: 3 %
HEMATOCRIT: 37.9 % (ref 35.0–45.0)
HEMOGLOBIN: 12.7 g/dL (ref 11.7–15.5)
LYMPHS PCT: 37 %
Lymphs Abs: 3626 cells/uL (ref 850–3900)
MCH: 32.1 pg (ref 27.0–33.0)
MCHC: 33.5 g/dL (ref 32.0–36.0)
MCV: 95.7 fL (ref 80.0–100.0)
MONO ABS: 686 {cells}/uL (ref 200–950)
MPV: 11.3 fL (ref 7.5–12.5)
Monocytes Relative: 7 %
NEUTROS PCT: 52 %
Neutro Abs: 5096 cells/uL (ref 1500–7800)
Platelets: 236 10*3/uL (ref 140–400)
RBC: 3.96 MIL/uL (ref 3.80–5.10)
RDW: 13.9 % (ref 11.0–15.0)
WBC: 9.8 10*3/uL (ref 3.8–10.8)

## 2017-06-20 LAB — LIPID PANEL
CHOL/HDL RATIO: 3.8 ratio (ref <5.0)
Cholesterol: 195 mg/dL (ref <200)
HDL: 51 mg/dL (ref >50)
LDL Cholesterol: 118 mg/dL — ABNORMAL HIGH (ref <100)
Triglycerides: 128 mg/dL (ref <150)
VLDL: 26 mg/dL (ref <30)

## 2017-06-20 LAB — TSH: TSH: 1.79 mIU/L

## 2017-06-20 LAB — COMPREHENSIVE METABOLIC PANEL
ALBUMIN: 4.1 g/dL (ref 3.6–5.1)
ALT: 9 U/L (ref 6–29)
AST: 15 U/L (ref 10–35)
Alkaline Phosphatase: 79 U/L (ref 33–130)
BUN: 14 mg/dL (ref 7–25)
CALCIUM: 9.3 mg/dL (ref 8.6–10.4)
CHLORIDE: 105 mmol/L (ref 98–110)
CO2: 28 mmol/L (ref 20–31)
Creat: 1.11 mg/dL — ABNORMAL HIGH (ref 0.60–0.93)
GLUCOSE: 94 mg/dL (ref 65–99)
POTASSIUM: 3.9 mmol/L (ref 3.5–5.3)
Sodium: 143 mmol/L (ref 135–146)
Total Bilirubin: 0.5 mg/dL (ref 0.2–1.2)
Total Protein: 6.9 g/dL (ref 6.1–8.1)

## 2017-06-20 LAB — HEMOGLOBIN A1C
HEMOGLOBIN A1C: 6 % — AB (ref <5.7)
MEAN PLASMA GLUCOSE: 126 mg/dL

## 2017-06-20 LAB — MAGNESIUM: MAGNESIUM: 1.8 mg/dL (ref 1.5–2.5)

## 2017-07-07 DIAGNOSIS — I10 Essential (primary) hypertension: Secondary | ICD-10-CM | POA: Diagnosis not present

## 2017-07-07 DIAGNOSIS — R0982 Postnasal drip: Secondary | ICD-10-CM | POA: Diagnosis not present

## 2017-07-07 DIAGNOSIS — J312 Chronic pharyngitis: Secondary | ICD-10-CM | POA: Diagnosis not present

## 2017-08-04 DIAGNOSIS — M5489 Other dorsalgia: Secondary | ICD-10-CM | POA: Diagnosis not present

## 2017-08-04 DIAGNOSIS — M1990 Unspecified osteoarthritis, unspecified site: Secondary | ICD-10-CM | POA: Diagnosis not present

## 2017-08-04 DIAGNOSIS — Z0000 Encounter for general adult medical examination without abnormal findings: Secondary | ICD-10-CM | POA: Diagnosis not present

## 2017-08-04 DIAGNOSIS — Z1389 Encounter for screening for other disorder: Secondary | ICD-10-CM | POA: Diagnosis not present

## 2017-08-04 DIAGNOSIS — J4530 Mild persistent asthma, uncomplicated: Secondary | ICD-10-CM | POA: Diagnosis not present

## 2017-08-04 DIAGNOSIS — I10 Essential (primary) hypertension: Secondary | ICD-10-CM | POA: Diagnosis not present

## 2017-09-29 ENCOUNTER — Ambulatory Visit (HOSPITAL_COMMUNITY)
Admission: RE | Admit: 2017-09-29 | Discharge: 2017-09-29 | Disposition: A | Discharge status: Home / Self Care | Payer: Medicare Other | Source: Ambulatory Visit | Attending: Internal Medicine | Admitting: Internal Medicine

## 2017-09-29 ENCOUNTER — Encounter (HOSPITAL_COMMUNITY): Payer: Self-pay

## 2017-09-29 DIAGNOSIS — Z1231 Encounter for screening mammogram for malignant neoplasm of breast: Secondary | ICD-10-CM | POA: Insufficient documentation

## 2017-10-13 DIAGNOSIS — I10 Essential (primary) hypertension: Secondary | ICD-10-CM | POA: Diagnosis not present

## 2017-10-13 DIAGNOSIS — N182 Chronic kidney disease, stage 2 (mild): Secondary | ICD-10-CM | POA: Diagnosis not present

## 2017-10-13 DIAGNOSIS — Z23 Encounter for immunization: Secondary | ICD-10-CM | POA: Diagnosis not present

## 2017-10-13 DIAGNOSIS — E7849 Other hyperlipidemia: Secondary | ICD-10-CM | POA: Diagnosis not present

## 2018-01-11 ENCOUNTER — Ambulatory Visit (INDEPENDENT_AMBULATORY_CARE_PROVIDER_SITE_OTHER): Payer: Medicare Other | Admitting: Otolaryngology

## 2018-01-11 DIAGNOSIS — H9041 Sensorineural hearing loss, unilateral, right ear, with unrestricted hearing on the contralateral side: Secondary | ICD-10-CM

## 2018-01-11 DIAGNOSIS — H9209 Otalgia, unspecified ear: Secondary | ICD-10-CM

## 2018-01-16 DIAGNOSIS — I10 Essential (primary) hypertension: Secondary | ICD-10-CM | POA: Diagnosis not present

## 2018-01-16 DIAGNOSIS — Z0001 Encounter for general adult medical examination with abnormal findings: Secondary | ICD-10-CM | POA: Diagnosis not present

## 2018-01-16 DIAGNOSIS — Z1331 Encounter for screening for depression: Secondary | ICD-10-CM | POA: Diagnosis not present

## 2018-01-16 DIAGNOSIS — R739 Hyperglycemia, unspecified: Secondary | ICD-10-CM | POA: Diagnosis not present

## 2018-01-16 DIAGNOSIS — J4530 Mild persistent asthma, uncomplicated: Secondary | ICD-10-CM | POA: Diagnosis not present

## 2018-01-16 DIAGNOSIS — Z1389 Encounter for screening for other disorder: Secondary | ICD-10-CM | POA: Diagnosis not present

## 2018-01-16 DIAGNOSIS — M5489 Other dorsalgia: Secondary | ICD-10-CM | POA: Diagnosis not present

## 2018-01-16 DIAGNOSIS — Z0000 Encounter for general adult medical examination without abnormal findings: Secondary | ICD-10-CM | POA: Diagnosis not present

## 2018-01-16 DIAGNOSIS — M1990 Unspecified osteoarthritis, unspecified site: Secondary | ICD-10-CM | POA: Diagnosis not present

## 2018-02-02 ENCOUNTER — Encounter: Payer: Self-pay | Admitting: Cardiology

## 2018-02-02 ENCOUNTER — Ambulatory Visit (INDEPENDENT_AMBULATORY_CARE_PROVIDER_SITE_OTHER): Payer: Medicare Other | Admitting: Cardiology

## 2018-02-02 VITALS — BP 126/84 | HR 63 | Ht 64.0 in | Wt 255.0 lb

## 2018-02-02 DIAGNOSIS — R0789 Other chest pain: Secondary | ICD-10-CM | POA: Diagnosis not present

## 2018-02-02 DIAGNOSIS — I10 Essential (primary) hypertension: Secondary | ICD-10-CM

## 2018-02-02 DIAGNOSIS — R002 Palpitations: Secondary | ICD-10-CM | POA: Diagnosis not present

## 2018-02-02 NOTE — Patient Instructions (Signed)
Medication Instructions:  Your physician recommends that you continue on your current medications as directed. Please refer to the Current Medication list given to you today.   Labwork: NONE   Testing/Procedures: NONE   Follow-Up: Your physician recommends that you schedule a follow-up appointment As Needed with Dr. Wyline MoodBranch    Any Other Special Instructions Will Be Listed Below (If Applicable).     If you need a refill on your cardiac medications before your next appointment, please call your pharmacy.  Thank you for choosing [facility]!

## 2018-02-02 NOTE — Progress Notes (Signed)
Clinical Summary [name] is a 72 y.o.female seen today for follow up of the following medical problems.   1. Chest pain - ER visit 01/27/17 with chest pain and SOB - negative enzymes, EKG nonacute. CXR no acute process - 03/2017 echo LVEF 60-65%, no WMAs, grade I diastolic dysfunction.   - no recent symptoms.   2. Palpitations - denies any recent symptoms.   3. HTN - she is compliant with meds   Past Medical History:  Diagnosis Date  . Asthma   . Chronic back pain   . History of Holter monitoring 2012   for palpitations and dyspnea; no acute findings  . Hypertension   . Migraine      No Known Allergies   Current Outpatient Medications  Medication Sig Dispense Refill  . albuterol (PROVENTIL HFA;VENTOLIN HFA) 108 (90 Base) MCG/ACT inhaler Inhale 2 puffs into the lungs every 6 (six) hours as needed for wheezing or shortness of breath.    Marland Kitchen. aspirin EC 81 MG tablet Take 1-4 mg by mouth daily as needed for moderate pain.    Marland Kitchen. losartan-hydrochlorothiazide (HYZAAR) 50-12.5 MG per tablet Take 1 tablet by mouth daily.    . metoprolol tartrate (LOPRESSOR) 50 MG tablet Take 1 tablet (50 mg total) by mouth 2 (two) times daily. 180 tablet 3  . Multiple Vitamin (MULTIVITAMIN WITH MINERALS) TABS tablet Take 1 tablet by mouth daily.    . Omega-3 Fatty Acids (FISH OIL PO) Take 1 capsule by mouth daily.    Marland Kitchen. omeprazole (PRILOSEC) 20 MG capsule Take 20 mg by mouth daily.    . silver sulfADIAZINE (SILVADENE) 1 % cream Apply 1 application topically 2 (two) times daily. (Patient not taking: Reported on 06/15/2017) 50 g 0   No current facility-administered medications for this visit.      Past Surgical History:  Procedure Laterality Date  . ABDOMINAL HYSTERECTOMY    . BREAST BIOPSY Right    benign  . BREAST SURGERY     biopsy     No Known Allergies    Family History  Problem Relation Age of Onset  . Stroke Father   . Heart disease Mother   . Hypertension Mother     . Heart attack Mother   . Seizures Brother   . Hypertension Brother   . Diabetes Sister   . Hypertension Sister   . Heart disease Sister   . Breast cancer Sister   . Diabetes Sister   . Hypertension Sister      Social History [name] reports that  has never smoked. she has never used smokeless tobacco. [name] reports that she does not drink alcohol.   Review of Systems CONSTITUTIONAL: No weight loss, fever, chills, weakness or fatigue.  HEENT: Eyes: No visual loss, blurred vision, double vision or yellow sclerae.No hearing loss, sneezing, congestion, runny nose or sore throat.  SKIN: No rash or itching.  CARDIOVASCULAR: per hpi RESPIRATORY: No shortness of breath, cough or sputum.  GASTROINTESTINAL: No anorexia, nausea, vomiting or diarrhea. No abdominal pain or blood.  GENITOURINARY: No burning on urination, no polyuria NEUROLOGICAL: No headache, dizziness, syncope, paralysis, ataxia, numbness or tingling in the extremities. No change in bowel or bladder control.  MUSCULOSKELETAL: No muscle, back pain, joint pain or stiffness.  LYMPHATICS: No enlarged nodes. No history of splenectomy.  PSYCHIATRIC: No history of depression or anxiety.  ENDOCRINOLOGIC: No reports of sweating, cold or heat intolerance. No polyuria or polydipsia.  .Marland Kitchen  Physical Examination Vitals:   02/02/18 1336  BP: 126/84  Pulse: 63  SpO2: 98%   Vitals:   02/02/18 1336  Weight: 255 lb (115.7 kg)  Height: 5\' 4"  (1.626 m)    Gen: resting comfortably, no acute distress HEENT: no scleral icterus, pupils equal round and reactive, no palptable cervical adenopathy,  CV: RRR, no m/r/g, no jvd Resp: Clear to auscultation bilaterally GI: abdomen is soft, non-tender, non-distended, normal bowel sounds, no hepatosplenomegaly MSK: extremities are warm, no edema.  Skin: warm, no rash Neuro:  no focal deficits Psych: appropriate affect   Diagnostic Studies 03/2017 echo Study Conclusions  -  Left ventricle: The cavity size was normal. Wall thickness was increased in a pattern of mild LVH. Systolic function was normal. The estimated ejection fraction was in the range of 60% to 65%. Wall motion was normal; there were no regional wall motion abnormalities. Doppler parameters are consistent with abnormal left ventricular relaxation (grade 1 diastolic dysfunction). - Aortic valve: Valve area (VTI): 2.87 cm^2. Valve area (Vmax): 2.98 cm^2. Valve area (Vmean): 2.45 cm^2. - Atrial septum: No defect or patent foramen ovale was identified. - Technically adquate study.    Assessment and Plan  1. Chest pain - previous atypical symptoms have resolved - no further cardiac workup at this time.   2. Palpitations - resolved, continue beta blocker. EKG in clinic shows NSR  3. HTN -at goal, continue current meds   F/u just as needed      Antoine Poche, M.D.

## 2018-02-06 ENCOUNTER — Encounter: Payer: Self-pay | Admitting: Cardiology

## 2018-04-18 ENCOUNTER — Other Ambulatory Visit: Payer: Self-pay | Admitting: Internal Medicine

## 2018-04-18 ENCOUNTER — Other Ambulatory Visit (HOSPITAL_COMMUNITY): Payer: Self-pay | Admitting: Internal Medicine

## 2018-04-18 DIAGNOSIS — N183 Chronic kidney disease, stage 3 (moderate): Secondary | ICD-10-CM | POA: Diagnosis not present

## 2018-04-18 DIAGNOSIS — N189 Chronic kidney disease, unspecified: Secondary | ICD-10-CM

## 2018-04-18 DIAGNOSIS — E1165 Type 2 diabetes mellitus with hyperglycemia: Secondary | ICD-10-CM | POA: Diagnosis not present

## 2018-04-23 ENCOUNTER — Ambulatory Visit (HOSPITAL_COMMUNITY)
Admission: RE | Admit: 2018-04-23 | Discharge: 2018-04-23 | Disposition: A | Discharge status: Home / Self Care | Payer: Medicare Other | Source: Ambulatory Visit | Attending: Internal Medicine | Admitting: Internal Medicine

## 2018-04-23 DIAGNOSIS — N189 Chronic kidney disease, unspecified: Secondary | ICD-10-CM

## 2018-04-23 DIAGNOSIS — N183 Chronic kidney disease, stage 3 (moderate): Secondary | ICD-10-CM | POA: Diagnosis not present

## 2018-04-26 ENCOUNTER — Other Ambulatory Visit: Payer: Self-pay | Admitting: Cardiology

## 2018-05-21 DIAGNOSIS — I10 Essential (primary) hypertension: Secondary | ICD-10-CM | POA: Diagnosis not present

## 2018-05-21 DIAGNOSIS — E1165 Type 2 diabetes mellitus with hyperglycemia: Secondary | ICD-10-CM | POA: Diagnosis not present

## 2018-08-20 ENCOUNTER — Other Ambulatory Visit (HOSPITAL_COMMUNITY): Payer: Self-pay | Admitting: Internal Medicine

## 2018-08-20 DIAGNOSIS — I10 Essential (primary) hypertension: Secondary | ICD-10-CM | POA: Diagnosis not present

## 2018-08-20 DIAGNOSIS — E1165 Type 2 diabetes mellitus with hyperglycemia: Secondary | ICD-10-CM | POA: Diagnosis not present

## 2018-08-20 DIAGNOSIS — R109 Unspecified abdominal pain: Secondary | ICD-10-CM

## 2018-08-20 DIAGNOSIS — Z1231 Encounter for screening mammogram for malignant neoplasm of breast: Secondary | ICD-10-CM

## 2018-08-20 DIAGNOSIS — N183 Chronic kidney disease, stage 3 (moderate): Secondary | ICD-10-CM | POA: Diagnosis not present

## 2018-08-23 ENCOUNTER — Ambulatory Visit (HOSPITAL_COMMUNITY): Payer: Medicare Other

## 2018-08-28 ENCOUNTER — Ambulatory Visit (HOSPITAL_COMMUNITY)
Admission: RE | Admit: 2018-08-28 | Discharge: 2018-08-28 | Disposition: A | Discharge status: Home / Self Care | Payer: Medicare Other | Source: Ambulatory Visit | Attending: Internal Medicine | Admitting: Internal Medicine

## 2018-08-28 DIAGNOSIS — R109 Unspecified abdominal pain: Secondary | ICD-10-CM | POA: Insufficient documentation

## 2018-09-06 ENCOUNTER — Encounter: Payer: Self-pay | Admitting: Internal Medicine

## 2018-10-01 ENCOUNTER — Ambulatory Visit (HOSPITAL_COMMUNITY)
Admission: RE | Admit: 2018-10-01 | Discharge: 2018-10-01 | Disposition: A | Discharge status: Home / Self Care | Payer: Medicare Other | Source: Ambulatory Visit | Attending: Internal Medicine | Admitting: Internal Medicine

## 2018-10-01 DIAGNOSIS — Z23 Encounter for immunization: Secondary | ICD-10-CM | POA: Diagnosis not present

## 2018-10-01 DIAGNOSIS — Z1231 Encounter for screening mammogram for malignant neoplasm of breast: Secondary | ICD-10-CM | POA: Diagnosis not present

## 2018-10-26 ENCOUNTER — Other Ambulatory Visit: Payer: Self-pay | Admitting: Cardiology

## 2018-10-31 DIAGNOSIS — M1990 Unspecified osteoarthritis, unspecified site: Secondary | ICD-10-CM | POA: Diagnosis not present

## 2018-10-31 DIAGNOSIS — Z0000 Encounter for general adult medical examination without abnormal findings: Secondary | ICD-10-CM | POA: Diagnosis not present

## 2018-10-31 DIAGNOSIS — E119 Type 2 diabetes mellitus without complications: Secondary | ICD-10-CM | POA: Diagnosis not present

## 2018-10-31 DIAGNOSIS — N183 Chronic kidney disease, stage 3 (moderate): Secondary | ICD-10-CM | POA: Diagnosis not present

## 2018-10-31 DIAGNOSIS — M5489 Other dorsalgia: Secondary | ICD-10-CM | POA: Diagnosis not present

## 2018-10-31 DIAGNOSIS — J4530 Mild persistent asthma, uncomplicated: Secondary | ICD-10-CM | POA: Diagnosis not present

## 2018-10-31 DIAGNOSIS — E1165 Type 2 diabetes mellitus with hyperglycemia: Secondary | ICD-10-CM | POA: Diagnosis not present

## 2018-12-03 ENCOUNTER — Ambulatory Visit: Payer: Medicare Other | Admitting: Gastroenterology

## 2018-12-18 ENCOUNTER — Ambulatory Visit: Payer: Medicare Other | Admitting: Gastroenterology

## 2019-01-09 DIAGNOSIS — N183 Chronic kidney disease, stage 3 (moderate): Secondary | ICD-10-CM | POA: Diagnosis not present

## 2019-01-09 DIAGNOSIS — R809 Proteinuria, unspecified: Secondary | ICD-10-CM | POA: Diagnosis not present

## 2019-01-09 DIAGNOSIS — E1129 Type 2 diabetes mellitus with other diabetic kidney complication: Secondary | ICD-10-CM | POA: Diagnosis not present

## 2019-01-10 ENCOUNTER — Ambulatory Visit (INDEPENDENT_AMBULATORY_CARE_PROVIDER_SITE_OTHER): Payer: Medicare Other | Admitting: Otolaryngology

## 2019-01-10 DIAGNOSIS — H9201 Otalgia, right ear: Secondary | ICD-10-CM

## 2019-01-10 DIAGNOSIS — H903 Sensorineural hearing loss, bilateral: Secondary | ICD-10-CM

## 2019-01-10 DIAGNOSIS — H608X3 Other otitis externa, bilateral: Secondary | ICD-10-CM

## 2019-01-17 DIAGNOSIS — I10 Essential (primary) hypertension: Secondary | ICD-10-CM | POA: Diagnosis not present

## 2019-01-17 DIAGNOSIS — Z1159 Encounter for screening for other viral diseases: Secondary | ICD-10-CM | POA: Diagnosis not present

## 2019-01-17 DIAGNOSIS — R809 Proteinuria, unspecified: Secondary | ICD-10-CM | POA: Diagnosis not present

## 2019-01-17 DIAGNOSIS — Z79899 Other long term (current) drug therapy: Secondary | ICD-10-CM | POA: Diagnosis not present

## 2019-01-17 DIAGNOSIS — D509 Iron deficiency anemia, unspecified: Secondary | ICD-10-CM | POA: Diagnosis not present

## 2019-01-17 DIAGNOSIS — N183 Chronic kidney disease, stage 3 (moderate): Secondary | ICD-10-CM | POA: Diagnosis not present

## 2019-01-27 ENCOUNTER — Encounter (HOSPITAL_COMMUNITY): Payer: Self-pay | Admitting: Emergency Medicine

## 2019-01-27 ENCOUNTER — Other Ambulatory Visit: Payer: Self-pay

## 2019-01-27 ENCOUNTER — Emergency Department (HOSPITAL_COMMUNITY)
Admission: EM | Admit: 2019-01-27 | Discharge: 2019-01-27 | Disposition: A | Discharge status: Home / Self Care | Payer: Medicare Other | Attending: Emergency Medicine | Admitting: Emergency Medicine

## 2019-01-27 DIAGNOSIS — J45909 Unspecified asthma, uncomplicated: Secondary | ICD-10-CM | POA: Diagnosis not present

## 2019-01-27 DIAGNOSIS — I10 Essential (primary) hypertension: Secondary | ICD-10-CM

## 2019-01-27 DIAGNOSIS — M25532 Pain in left wrist: Secondary | ICD-10-CM | POA: Diagnosis not present

## 2019-01-27 DIAGNOSIS — J321 Chronic frontal sinusitis: Secondary | ICD-10-CM | POA: Insufficient documentation

## 2019-01-27 LAB — BASIC METABOLIC PANEL
ANION GAP: 7 (ref 5–15)
BUN: 12 mg/dL (ref 8–23)
CALCIUM: 8.7 mg/dL — AB (ref 8.9–10.3)
CO2: 26 mmol/L (ref 22–32)
Chloride: 108 mmol/L (ref 98–111)
Creatinine, Ser: 1.03 mg/dL — ABNORMAL HIGH (ref 0.44–1.00)
GFR calc Af Amer: >60 mL/min (ref >60)
GFR calc non Af Amer: 54 mL/min — ABNORMAL LOW (ref >60)
GLUCOSE: 91 mg/dL (ref 70–99)
Potassium: 3.7 mmol/L (ref 3.5–5.1)
Sodium: 141 mmol/L (ref 135–145)

## 2019-01-27 MED ORDER — AMOXICILLIN-POT CLAVULANATE 875-125 MG PO TABS: 1.0000 | ORAL_TABLET | Freq: Two times a day (BID) | ORAL | 0 refills | Status: DC

## 2019-01-27 MED ORDER — METOPROLOL TARTRATE 50 MG PO TABS
50.0000 mg | ORAL_TABLET | Freq: Once | ORAL | Status: AC
  Administered 2019-01-27: 50 mg via ORAL
  Filled 2019-01-27: qty 1

## 2019-01-27 MED ORDER — AMOXICILLIN-POT CLAVULANATE 875-125 MG PO TABS
1.0000 | ORAL_TABLET | Freq: Once | ORAL | Status: AC
  Administered 2019-01-27: 1 via ORAL
  Filled 2019-01-27: qty 1

## 2019-01-27 MED ORDER — INDOMETHACIN 25 MG PO CAPS: 25.0000 mg | ORAL_CAPSULE | Freq: Three times a day (TID) | ORAL | 0 refills | Status: DC

## 2019-01-27 MED ORDER — COLCHICINE 0.6 MG PO TABS
1.2000 mg | ORAL_TABLET | Freq: Once | ORAL | Status: AC
  Administered 2019-01-27: 1.2 mg via ORAL
  Filled 2019-01-27: qty 2

## 2019-01-27 NOTE — ED Triage Notes (Signed)
Pt check her BP this morning and stated her BP was 189/100 and wanted to get it check out.  Pt also c/o of left hand pain with swelling since this morning.  Denies any injury

## 2019-01-27 NOTE — ED Provider Notes (Signed)
[facility]ERGENCY [facility]PART[facility] Provider Note   CSN: [id number] Arrival date & time: 01/27/19  1444     History   Chief Complaint Chief Complaint  Patient presents with  . Hypertension  . Hand Pain    HPI Brianna Manning is a 73 y.o. female.  HPI  The patient is a pleasant 73 year old female with a known history of headaches, high blood pressure and chronic back pain.  She is on medications including 50 mg of metoprolol twice a day and 100 mg of losartan daily.  She has been taking her medications as prescribed, today was her last dose of metoprolol.  She reports that she can get the medication filled tomorrow.  She states that she has had several complaints including a headache which is been going on the last couple of mornings when she wakes up and seems to resolve throughout the day.  The headache is located over her frontal sinuses and sometimes over the right maxillary sinus and notes that she has had lots of clear discharge from her nose related to this.  No sore throat or fevers, no changes in vision.  She has not been taking any medications for her sinuses and has not seen her family doctor.  She also complains of high blood pressure which was noted when she first checked it this morning.  She did take her medications and that is improving.  She denies any chest pain shortness of breath numbness or weakness.  Thirdly she complains of left wrist pain.  This pain started yesterday and overnight got worse and today is even worse.  There is some associated swelling of the wrist, this is atraumatic is there is no injuries.  She denies any numbness or weakness to the hand but states that any movement or touch of the wrist makes it worse.  No fevers, no redness, no history of septic arthritis.  Past Medical History:  Diagnosis Date  . Asthma   . Chronic back pain   . History of Holter monitoring 2012   for palpitations and dyspnea; no acute findings  . Hypertension   .  Migraine     Patient Active Problem List   Diagnosis Date Noted  . 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 Pilonidal cyst 02/16/2017  . Vaginal pain 02/16/2017  . Vaginal odor 02/16/2017  . Urinary frequency 02/16/2017  . Postmenopausal 02/16/2017  . History of hysterectomy 02/16/2017    Past Surgical History:  Procedure Laterality Date  . ABDOMINAL HYSTERECTOMY    . BREAST BIOPSY Right    benign  . BREAST SURGERY     biopsy     OB History   No obstetric history on file.      Home Medications    Prior to Admission medications   Medication Sig Start Date End Date Taking? Authorizing Provider  albuterol (PROVENTIL HFA;VENTOLIN HFA) 108 (90 Base) MCG/ACT inhaler Inhale 2 puffs into the lungs every 6 (six) hours as needed for wheezing or shortness of breath.   Yes [provider]  Ascorbic Acid (VITAMIN C) 100 MG tablet Take 100 mg by mouth daily.   Yes [provider]  aspirin EC 81 MG tablet Take 81 mg by mouth daily as needed for mild pain or moderate pain.    Yes [provider]  atorvastatin (LIPITOR) 20 MG tablet Take 20 mg by mouth every morning.   Yes [provider]  losartan (COZAAR) 100 MG tablet Take 100 mg by mouth  every morning.   Yes [provider]  metFORMIN (GLUCOPHAGE) 500 MG tablet Take 500 mg by mouth 2 (two) times daily with a meal.   Yes [provider]  metoprolol tartrate (LOPRESSOR) 50 MG tablet TAKE 1 TABLET BY MOUTH TWICE DAILY. Patient taking differently: Take 50 mg by mouth 2 (two) times daily.  10/26/18  Yes BranchDorothe Pea, MD  omeprazole (PRILOSEC) 20 MG capsule Take 20 mg by mouth every morning.  08/13/15  Yes [provider]  amoxicillin-clavulanate (AUGMENTIN) 875-125 MG tablet Take 1 tablet by mouth every 12 (twelve) hours. 01/27/19   Eber Hong, MD  indomethacin (INDOCIN) 25 MG capsule Take 1 capsule (25 mg total) by mouth 3 (three) times daily with meals. May take  up to 50mg  three times a day if no improvement with 25mg . 01/27/19   Eber Hong, MD    Family History Family History  Problem Relation Age of Onset  . Stroke Father   . Heart disease Mother   . Hypertension Mother   . Heart attack Mother   . Seizures Brother   . Hypertension Brother   . Diabetes Sister   . Hypertension Sister   . Heart disease Sister   . Breast cancer Sister   . Diabetes Sister   . Hypertension Sister     Social History Social History   Tobacco Use  . Smoking status: Never Smoker  . Smokeless tobacco: Never Used  Substance Use Topics  . Alcohol use: No  . Drug use: No     Allergies   Patient has no known allergies.   Review of Systems Review of Systems  Constitutional: Negative for chills and fever.  HENT: Negative for sore throat.   Eyes: Negative for visual disturbance.  Respiratory: Negative for cough and shortness of breath.   Cardiovascular: Negative for chest pain.  Gastrointestinal: Negative for abdominal pain, diarrhea, nausea and vomiting.  Genitourinary: Negative for dysuria and frequency.  Musculoskeletal: Positive for arthralgias. Negative for back pain and neck pain.  Skin: Negative for rash.  Neurological: Positive for headaches. Negative for weakness and numbness.  Hematological: Negative for adenopathy.  Psychiatric/Behavioral: Negative for behavioral problems.     Physical Exam Updated Vital Signs BP (!) 162/71   Pulse 63   Temp 98 F (36.7 C) (Oral)   Resp 11   Ht 1.626 m (5\' 4" )   Wt 113.4 kg   SpO2 99%   BMI 42.91 kg/m   Physical Exam Vitals signs and nursing note reviewed.  Constitutional:      General: She is not in acute distress.    Appearance: She is well-developed.  HENT:     Head: Normocephalic and atraumatic.     Comments: Tenderness to palpation over the bilateral frontal sinuses right greater than left    Nose:     Comments: Turbinates are minimally swollen, no significant drainage, bleeding or  foreign bodies    Mouth/Throat:     Pharynx: No oropharyngeal exudate.     Comments: Oropharynx is clear and moist Eyes:     General: No scleral icterus.       Right eye: No discharge.        Left eye: No discharge.     Conjunctiva/sclera: Conjunctivae normal.     Pupils: Pupils are equal, round, and reactive to light.  Neck:     Musculoskeletal: Normal range of motion and neck supple.     Thyroid: No thyromegaly.     Vascular: No  JVD.  Cardiovascular:     Rate and Rhythm: Normal rate and regular rhythm.     Heart sounds: Normal heart sounds. No murmur. No friction rub. No gallop.   Pulmonary:     Effort: Pulmonary effort is normal. No respiratory distress.     Breath sounds: Normal breath sounds. No wheezing or rales.  Abdominal:     General: Bowel sounds are normal. There is no distension.     Palpations: Abdomen is soft. There is no mass.     Tenderness: There is no abdominal tenderness.  Musculoskeletal:        General: Swelling and tenderness present. No deformity or signs of injury.     Comments: All major joints have normal range of motion except for the left wrist was had some mild warmth, swelling and decreased range of motion.  She is able to grip with the left hand without any difficulty and move her elbow without any difficulty.  There is no overlying redness of this area  Lymphadenopathy:     Cervical: No cervical adenopathy.  Skin:    General: Skin is warm and dry.     Findings: No erythema or rash.  Neurological:     Mental Status: She is alert.     Coordination: Coordination normal.     Comments: Normal speech, memory and ability to communicate.  Coordination is totally normal  Psychiatric:        Behavior: Behavior normal.      ED Treatments / Results  Labs (all labs ordered are listed, but only abnormal results are displayed) Labs Reviewed  BASIC METABOLIC PANEL - Abnormal; Notable for the following components:      Result Value   Creatinine, Ser 1.03  (*)    Calcium 8.7 (*)    GFR calc non Af Amer 54 (*)    All other components within normal limits    EKG None  Radiology No results found.  Procedures Procedures (including critical care time)  Medications Ordered in ED Medications  colchicine tablet 1.2 mg (has no administration in time range)  amoxicillin-clavulanate (AUGMENTIN) 875-125 MG per tablet 1 tablet (1 tablet Oral Given 01/27/19 1713)  metoprolol tartrate (LOPRESSOR) tablet 50 mg (50 mg Oral Given 01/27/19 1713)     Initial Impression / Assessment and Plan / ED Course  I have reviewed the triage vital signs and the nursing notes.  Pertinent labs & imaging results that were available during my care of the patient were reviewed by me and considered in my medical decision making (see chart for details).  Clinical Course as of Jan 27 1755  Sun Jan 27, 2019  1753 Labs confirm that the creatinine is normal at 1.03 we will proceed with dose of colchicine at home with indomethacin for 1 week at the most   [BM]    Clinical Course User Index [BM] Eber HongMiller, Duan Scharnhorst, MD    The patient symptoms are likely consistent with acute gout.  She has no fever, she has no trauma.  She does have a history of some type of renal abnormality and she was recently referred to a nephrologist, lab work was drawn last week but we do not have access to these results and as it is the weekend we will have to draw a basic metabolic panel before prescribing medications such as anti-inflammatories for acute gout.  Additionally she would be a poor candidate for prednisone given her underlying history of diabetes.  Blood pressure rechecked and at this  time is 167/78, I do not think acute treatment for this is at this time necessary.  She will follow-up for rechecks.  She will be given her evening dose of metoprolol prior to discharge as she is out of her medications and cannot get it filled till tomorrow morning.  As far as her headache goes, this may be  related to sinusitis.  She has a normal neurologic exam otherwise and I see no reason to do imaging of her brain with only 3 days of morning headaches.  Final Clinical Impressions(s) / ED Diagnoses   Final diagnoses:  Essential hypertension  Frontal sinusitis, unspecified chronicity  Wrist pain, left    ED Discharge Orders         Ordered    indomethacin (INDOCIN) 25 MG capsule  3 times daily with meals     01/27/19 1755    amoxicillin-clavulanate (AUGMENTIN) 875-125 MG tablet  Every 12 hours     01/27/19 1755           Eber Hong, MD 01/27/19 1756

## 2019-01-27 NOTE — Discharge Instructions (Signed)
Please return to the emergency department immediately for increasing pain swelling redness or fevers.  Take indomethacin 3 times a day for no longer than 7 days, if your pain resolves you may stop taking this medication.  Seek medical exam within 2 days for recheck of your wrist as well as your blood pressure.  Augmentin twice a day for possible sinus infection, this should be taken for 7 days.

## 2019-02-01 DIAGNOSIS — Z1389 Encounter for screening for other disorder: Secondary | ICD-10-CM | POA: Diagnosis not present

## 2019-02-01 DIAGNOSIS — M1990 Unspecified osteoarthritis, unspecified site: Secondary | ICD-10-CM | POA: Diagnosis not present

## 2019-02-01 DIAGNOSIS — Z0000 Encounter for general adult medical examination without abnormal findings: Secondary | ICD-10-CM | POA: Diagnosis not present

## 2019-02-01 DIAGNOSIS — I10 Essential (primary) hypertension: Secondary | ICD-10-CM | POA: Diagnosis not present

## 2019-02-01 DIAGNOSIS — Z0001 Encounter for general adult medical examination with abnormal findings: Secondary | ICD-10-CM | POA: Diagnosis not present

## 2019-02-01 DIAGNOSIS — M5489 Other dorsalgia: Secondary | ICD-10-CM | POA: Diagnosis not present

## 2019-02-01 DIAGNOSIS — E1165 Type 2 diabetes mellitus with hyperglycemia: Secondary | ICD-10-CM | POA: Diagnosis not present

## 2019-02-01 DIAGNOSIS — N183 Chronic kidney disease, stage 3 (moderate): Secondary | ICD-10-CM | POA: Diagnosis not present

## 2019-02-08 DIAGNOSIS — N183 Chronic kidney disease, stage 3 (moderate): Secondary | ICD-10-CM | POA: Diagnosis not present

## 2019-02-08 DIAGNOSIS — R809 Proteinuria, unspecified: Secondary | ICD-10-CM | POA: Diagnosis not present

## 2019-02-17 DIAGNOSIS — E119 Type 2 diabetes mellitus without complications: Secondary | ICD-10-CM | POA: Diagnosis not present

## 2019-02-17 DIAGNOSIS — Z0100 Encounter for examination of eyes and vision without abnormal findings: Secondary | ICD-10-CM | POA: Diagnosis not present

## 2019-02-18 ENCOUNTER — Encounter

## 2019-02-18 ENCOUNTER — Encounter: Payer: Self-pay | Admitting: *Deleted

## 2019-02-18 ENCOUNTER — Ambulatory Visit (INDEPENDENT_AMBULATORY_CARE_PROVIDER_SITE_OTHER): Payer: Medicare Other | Admitting: Gastroenterology

## 2019-02-18 ENCOUNTER — Other Ambulatory Visit: Payer: Self-pay | Admitting: *Deleted

## 2019-02-18 ENCOUNTER — Encounter: Payer: Self-pay | Admitting: Gastroenterology

## 2019-02-18 DIAGNOSIS — R1011 Right upper quadrant pain: Secondary | ICD-10-CM | POA: Diagnosis not present

## 2019-02-18 DIAGNOSIS — K5900 Constipation, unspecified: Secondary | ICD-10-CM

## 2019-02-18 DIAGNOSIS — G8929 Other chronic pain: Secondary | ICD-10-CM

## 2019-02-18 DIAGNOSIS — K219 Gastro-esophageal reflux disease without esophagitis: Secondary | ICD-10-CM

## 2019-02-18 MED ORDER — LUBIPROSTONE 8 MCG PO CAPS: 8.0000 ug | ORAL_CAPSULE | Freq: Two times a day (BID) | ORAL | 3 refills | Status: AC

## 2019-02-18 NOTE — Progress Notes (Signed)
CC'D TO PCP °

## 2019-02-18 NOTE — Progress Notes (Signed)
Primary Care Physician:  Avon Gully, MD  Primary Gastroenterologist:  Roetta Sessions, MD   Chief Complaint  Patient presents with  . Abdominal Pain    ruq "irritation"    HPI:  Brianna Manning is a 73 y.o. female here at the request of Dr. Felecia Shelling for further evaluation of abdominal pain.  Intermittent RUQ discomfort for two years, happens several times per week. No nocturnal. No N/V. Breakthrough heartburn, regurgitation. No dysphagia. Stays constipated all the time, takes a vegetable laxative couple of times per week. Has BM with laxative. If doesn't have a BM, after two days takes something. No melena, brbpr.   Abdominal ultrasound performed September 2019, nothing to explain abdominal pain.  Gallbladder unremarkable.  Recent indomethacin for gout. ASA sometimes for pain.  Colonoscopy in 2012, unremarkable.   Current Outpatient Medications  Medication Sig Dispense Refill  . albuterol (PROVENTIL HFA;VENTOLIN HFA) 108 (90 Base) MCG/ACT inhaler Inhale 2 puffs into the lungs every 6 (six) hours as needed for wheezing or shortness of breath.    . Ascorbic Acid (VITAMIN C) 100 MG tablet Take 100 mg by mouth as needed.     Marland Kitchen aspirin EC 81 MG tablet Take 81 mg by mouth daily as needed for mild pain or moderate pain.     Marland Kitchen atorvastatin (LIPITOR) 20 MG tablet Take 20 mg by mouth every morning.    . indomethacin (INDOCIN) 25 MG capsule Take 1 capsule (25 mg total) by mouth 3 (three) times daily with meals. May take up to 50mg  three times a day if no improvement with 25mg . 21 capsule 0  . Krill Oil 350 MG CAPS Take 1 capsule by mouth daily.    Marland Kitchen losartan (COZAAR) 100 MG tablet Take 100 mg by mouth every morning.    . metFORMIN (GLUCOPHAGE) 500 MG tablet Take 500 mg by mouth 2 (two) times daily with a meal.    . metoprolol tartrate (LOPRESSOR) 50 MG tablet TAKE 1 TABLET BY MOUTH TWICE DAILY. (Patient taking differently: Take 50 mg by mouth 2 (two) times daily. ) 180 tablet 3  . omeprazole  (PRILOSEC) 20 MG capsule Take 20 mg by mouth every morning.     . Vitamin D, Cholecalciferol, 25 MCG (1000 UT) TABS Take 1 tablet by mouth daily.    Marland Kitchen lubiprostone (AMITIZA) 8 MCG capsule Take 1 capsule (8 mcg total) by mouth 2 (two) times daily with a meal. 60 capsule 3   No current facility-administered medications for this visit.     Allergies as of 02/18/2019  . (No Known Allergies)    Past Medical History:  Diagnosis Date  . Asthma   . Chronic back pain   . Diabetes (HCC)   . Gout   . History of Holter monitoring 2012   for palpitations and dyspnea; no acute findings  . Hypertension   . Migraine     Past Surgical History:  Procedure Laterality Date  . ABDOMINAL HYSTERECTOMY    . BREAST BIOPSY Right    benign  . BREAST SURGERY     biopsy  . COLONOSCOPY  01/2011   Dr. Jena Gauss: anal papilla, normal colon. Next TCS 2022.     Family History  Problem Relation Age of Onset  . Stroke Father   . Heart disease Mother   . Hypertension Mother   . Heart attack Mother   . Seizures Brother   . Hypertension Brother   . Diabetes Sister   . Hypertension Sister   .  Heart disease Sister   . Breast cancer Sister   . Diabetes Sister   . Hypertension Sister   . Colon cancer Neg Hx     Social History   Socioeconomic History  . Marital status: Divorced    Spouse name: Not on file  . Number of children: Not on file  . Years of education: Not on file  . Highest education level: Not on file  Occupational History  . Not on file  Social Needs  . Financial resource strain: Not on file  . Food insecurity:    Worry: Not on file    Inability: Not on file  . Transportation needs:    Medical: Not on file    Non-medical: Not on file  Tobacco Use  . Smoking status: Never Smoker  . Smokeless tobacco: Never Used  Substance and Sexual Activity  . Alcohol use: No  . Drug use: No  . Sexual activity: Not Currently  Lifestyle  . Physical activity:    Days per week: Not on file     Minutes per session: Not on file  . Stress: Not on file  Relationships  . Social connections:    Talks on phone: Not on file    Gets together: Not on file    Attends religious service: Not on file    Active member of club or organization: Not on file    Attends meetings of clubs or organizations: Not on file    Relationship status: Not on file  . Intimate partner violence:    Fear of current or ex partner: Not on file    Emotionally abused: Not on file    Physically abused: Not on file    Forced sexual activity: Not on file  Other Topics Concern  . Not on file  Social History Narrative  . Not on file      ROS:  General: Negative for anorexia, weight loss, fever, chills, fatigue, weakness. Eyes: Negative for vision changes.  ENT: Negative for hoarseness, difficulty swallowing , nasal congestion. CV: Negative for chest pain, angina, palpitations, dyspnea on exertion, peripheral edema.  Respiratory: Negative for dyspnea at rest, dyspnea on exertion, cough, sputum, wheezing.  GI: See history of present illness. GU:  Negative for dysuria, hematuria, urinary incontinence, urinary frequency, nocturnal urination.  MS: Positive for joint pain, negative low back pain.  Derm: Negative for rash or itching.  Neuro: Negative for weakness, abnormal sensation, seizure, frequent headaches, memory loss, confusion.  Psych: Negative for anxiety, depression, suicidal ideation, hallucinations.  Endo: Negative for unusual weight change.  Heme: Negative for bruising or bleeding. Allergy: Negative for rash or hives.    Physical Examination:  BP 138/69   Pulse 64   Temp (!) 97.1 F (36.2 C) (Oral)   Ht  (1.626 m)   Wt 250 lb 6.4 oz (113.6 kg)   BMI 42.98 kg/m    General: Well-nourished, well-developed in no acute distress.  Head: Normocephalic, atraumatic.   Eyes: Conjunctiva pink, no icterus. Mouth: Oropharyngeal mucosa moist and pink , no lesions erythema or exudate. Neck: Supple  without thyromegaly, masses, or lymphadenopathy.  Lungs: Clear to auscultation bilaterally.  Heart: Regular rate and rhythm, no murmurs rubs or gallops.  Abdomen: Bowel sounds are normal, nontender, nondistended, no hepatosplenomegaly or masses, no abdominal bruits or    hernia , no rebound or guarding.   Rectal: Not performed Extremities: No lower extremity edema. No clubbing or deformities.  Neuro: Alert and oriented x 4 ,  grossly normal neurologically.  Skin: Warm and dry, no rash or jaundice.   Psych: Alert and cooperative, normal mood and affect.

## 2019-02-18 NOTE — Patient Instructions (Signed)
Start Amitiza with food twice a day for constipation. Samples provided. RX sent to your pharmacy. If you don't see good results, let me know and we will increase your dose. If too strong, you can go down to once daily.   Upper endoscopy as scheduled. See separate instructions.

## 2019-02-18 NOTE — Assessment & Plan Note (Signed)
Very pleasant 73 year old female with 2-year history of frequent right upper quadrant discomfort, not necessarily related to meals.  Described as feeling like something is irritated in the right upper quadrant area, sometimes feels movement.  No associated vomiting.  Has chronic reflux, with ongoing breakthrough symptoms.  Chronic constipation, partially managed.  Etiology of symptoms unknown at this time.  Unlikely biliary.  Cannot exclude gastritis/duodenitis/peptic ulcer disease.  Need to adequately manage constipation to see if this helps with her symptoms as well.  Plan for upper endoscopy.  I have discussed the risks, alternatives, benefits with regards to but not limited to the risk of reaction to medication, bleeding, infection, perforation and the patient is agreeable to proceed. Written consent to be obtained.  Add Amitiza 8 mcg twice daily.  If upper endoscopy unremarkable and symptoms ongoing despite constipation management, would consider CT imaging to complete work-up.

## 2019-04-10 ENCOUNTER — Encounter (HOSPITAL_COMMUNITY): Payer: Self-pay | Admitting: Emergency Medicine

## 2019-04-10 ENCOUNTER — Emergency Department (HOSPITAL_COMMUNITY): Payer: Medicare Other

## 2019-04-10 ENCOUNTER — Emergency Department (HOSPITAL_COMMUNITY)
Admission: EM | Admit: 2019-04-10 | Discharge: 2019-04-10 | Disposition: A | Discharge status: Home / Self Care | Payer: Medicare Other | Attending: Emergency Medicine | Admitting: Emergency Medicine

## 2019-04-10 ENCOUNTER — Other Ambulatory Visit: Payer: Self-pay

## 2019-04-10 DIAGNOSIS — J45901 Unspecified asthma with (acute) exacerbation: Secondary | ICD-10-CM | POA: Diagnosis not present

## 2019-04-10 DIAGNOSIS — Z7984 Long term (current) use of oral hypoglycemic drugs: Secondary | ICD-10-CM | POA: Diagnosis not present

## 2019-04-10 DIAGNOSIS — R0602 Shortness of breath: Secondary | ICD-10-CM | POA: Diagnosis not present

## 2019-04-10 DIAGNOSIS — J4541 Moderate persistent asthma with (acute) exacerbation: Secondary | ICD-10-CM | POA: Diagnosis not present

## 2019-04-10 DIAGNOSIS — I10 Essential (primary) hypertension: Secondary | ICD-10-CM | POA: Diagnosis not present

## 2019-04-10 DIAGNOSIS — Z79899 Other long term (current) drug therapy: Secondary | ICD-10-CM | POA: Diagnosis not present

## 2019-04-10 DIAGNOSIS — J3089 Other allergic rhinitis: Secondary | ICD-10-CM | POA: Diagnosis not present

## 2019-04-10 DIAGNOSIS — E119 Type 2 diabetes mellitus without complications: Secondary | ICD-10-CM | POA: Insufficient documentation

## 2019-04-10 LAB — CBC WITH DIFFERENTIAL/PLATELET
Abs Immature Granulocytes: 0.04 10*3/uL (ref 0.00–0.07)
Basophils Absolute: 0.1 10*3/uL (ref 0.0–0.1)
Basophils Relative: 1 %
Eosinophils Absolute: 0.2 10*3/uL (ref 0.0–0.5)
Eosinophils Relative: 1 %
HCT: 32.9 % — ABNORMAL LOW (ref 36.0–46.0)
Hemoglobin: 10.6 g/dL — ABNORMAL LOW (ref 12.0–15.0)
Immature Granulocytes: 0 %
Lymphocytes Relative: 13 %
Lymphs Abs: 1.6 10*3/uL (ref 0.7–4.0)
MCH: 32 pg (ref 26.0–34.0)
MCHC: 32.2 g/dL (ref 30.0–36.0)
MCV: 99.4 fL (ref 80.0–100.0)
Monocytes Absolute: 1.2 10*3/uL — ABNORMAL HIGH (ref 0.1–1.0)
Monocytes Relative: 10 %
Neutro Abs: 9.1 10*3/uL — ABNORMAL HIGH (ref 1.7–7.7)
Neutrophils Relative %: 75 %
Platelets: 170 10*3/uL (ref 150–400)
RBC: 3.31 MIL/uL — ABNORMAL LOW (ref 3.87–5.11)
RDW: 13.3 % (ref 11.5–15.5)
WBC: 12.1 10*3/uL — ABNORMAL HIGH (ref 4.0–10.5)
nRBC: 0 % (ref 0.0–0.2)

## 2019-04-10 LAB — BASIC METABOLIC PANEL
Anion gap: 9 (ref 5–15)
BUN: 12 mg/dL (ref 8–23)
CO2: 28 mmol/L (ref 22–32)
Calcium: 8.6 mg/dL — ABNORMAL LOW (ref 8.9–10.3)
Chloride: 103 mmol/L (ref 98–111)
Creatinine, Ser: 1.03 mg/dL — ABNORMAL HIGH (ref 0.44–1.00)
GFR calc Af Amer: >60 mL/min (ref >60)
GFR calc non Af Amer: 54 mL/min — ABNORMAL LOW (ref >60)
Glucose, Bld: 115 mg/dL — ABNORMAL HIGH (ref 70–99)
Potassium: 3.5 mmol/L (ref 3.5–5.1)
Sodium: 140 mmol/L (ref 135–145)

## 2019-04-10 LAB — BRAIN NATRIURETIC PEPTIDE: B Natriuretic Peptide: 503 pg/mL — ABNORMAL HIGH (ref 0.0–100.0)

## 2019-04-10 MED ORDER — PREDNISONE 50 MG PO TABS
60.0000 mg | ORAL_TABLET | Freq: Once | ORAL | Status: AC
  Administered 2019-04-10: 60 mg via ORAL
  Filled 2019-04-10: qty 1

## 2019-04-10 MED ORDER — IOHEXOL 350 MG/ML SOLN
100.0000 mL | Freq: Once | INTRAVENOUS | Status: AC | PRN
  Administered 2019-04-10: 100 mL via INTRAVENOUS

## 2019-04-10 MED ORDER — PREDNISONE 10 MG PO TABS: ORAL_TABLET | ORAL | 0 refills | Status: DC

## 2019-04-10 MED ORDER — FLUTICASONE PROPIONATE 50 MCG/ACT NA SUSP: 1.0000 | Freq: Every day | NASAL | 0 refills | Status: AC

## 2019-04-10 NOTE — ED Provider Notes (Signed)
[facility]ENCY [facility]PARTMENT Provider Note   CSN: [id number] Arrival date & time: 04/10/19  1206    History   Chief Complaint Chief Complaint  Patient presents with  . Shortness of Breath    HPI Brianna Manning is a 73 y.o. female.     HPI   She presents for evaluation of "wheezing."  She came by private vehicle.  She has been feeling bad for several days with shortness of breath "all the time."  No change in shortness of breath with exertion, or lying down.  She has been having some itchy eyes and nasal congestion for several days.  She is using an unknown over-the-counter antihistamine.  She uses pro-air inhaler for shortness of breath and "asthma."  She denies fever, vomiting, dizziness, paresthesias or focal weakness.  She is taking her usual medications.  There are no other known modifying factors.  Past Medical History:  Diagnosis Date  . Asthma   . Chronic back pain   . Diabetes (HCC)   . Gout   . History of Holter monitoring 2012   for palpitations and dyspnea; no acute findings  . Hypertension   . Migraine     Patient Active Problem List   Diagnosis Date Noted  . Chronic RUQ pain 02/18/2019  . Constipation 02/18/2019  . GERD (gastroesophageal reflux disease) 02/18/2019  . 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 Pilonidal cyst 02/16/2017  . Vaginal pain 02/16/2017  . Vaginal odor 02/16/2017  . Urinary frequency 02/16/2017  . Postmenopausal 02/16/2017  . History of hysterectomy 02/16/2017    Past Surgical History:  Procedure Laterality Date  . ABDOMINAL HYSTERECTOMY    . BREAST BIOPSY Right    benign  . BREAST SURGERY     biopsy  . COLONOSCOPY  01/2011   Dr. Jena Gaussourk: anal papilla, normal colon. Next TCS 2022.      OB History    Gravida      Para      Term      Preterm      AB      Living  2     SAB      TAB      Ectopic      Multiple      Live Births               Home Medications    Prior to Admission  medications   Medication Sig Start Date End Date Taking? Authorizing Provider  albuterol (PROVENTIL HFA;VENTOLIN HFA) 108 (90 Base) MCG/ACT inhaler Inhale 2 puffs into the lungs every 6 (six) hours as needed for wheezing or shortness of breath.   Yes [provider]  Ascorbic Acid (VITAMIN C) 100 MG tablet Take 100 mg by mouth as needed.    Yes [provider]  aspirin EC 81 MG tablet Take 81 mg by mouth daily as needed for mild pain or moderate pain.    Yes [provider]  atorvastatin (LIPITOR) 20 MG tablet Take 20 mg by mouth every morning.   Yes [provider]  indomethacin (INDOCIN) 25 MG capsule Take 1 capsule (25 mg total) by mouth 3 (three) times daily with meals. May take up to 50mg  three times a day if no improvement with 25mg . 01/27/19  Yes Eber HongMiller, Brian, MD  losartan (COZAAR) 100 MG tablet Take 100 mg by mouth every morning.   Yes [provider]  lubiprostone (AMITIZA) 8 MCG capsule Take 1 capsule (8 mcg  total) by mouth 2 (two) times daily with a meal. 02/18/19  Yes Tiffany Kocher, PA-C  metFORMIN (GLUCOPHAGE) 500 MG tablet Take 500 mg by mouth 2 (two) times daily with a meal.   Yes [provider]  metoprolol tartrate (LOPRESSOR) 50 MG tablet TAKE 1 TABLET BY MOUTH TWICE DAILY. Patient taking differently: Take 50 mg by mouth 2 (two) times daily.  10/26/18  Yes Branch, Dorothe Pea, MD  naphazoline-pheniramine (NAPHCON-A) 0.025-0.3 % ophthalmic solution Place 1 drop into both eyes 4 (four) times daily as needed for eye irritation.   Yes [provider]  omega-3 fish oil (MAXEPA) 1000 MG CAPS capsule Take 1 capsule by mouth 2 (two) times daily.   Yes [provider]  omeprazole (PRILOSEC) 20 MG capsule Take 20 mg by mouth every morning.  08/13/15  Yes [provider]  prednisoLONE acetate (PRED FORTE) 1 % ophthalmic suspension See admin instructions. Instill 4 drops into each ear twice daily as needed for pain    Yes [provider]  Vitamin D, Cholecalciferol, 25 MCG (1000 UT) TABS Take 1 tablet by mouth daily.   Yes [provider]  fluticasone (FLONASE) 50 MCG/ACT nasal spray Place 1 spray into both nostrils daily. 04/10/19   [name], MD  Krill Oil 350 MG CAPS Take 1 capsule by mouth daily.    [provider]  predniSONE (DELTASONE) 10 MG tablet Take q day 6,5,4,3,2,1 04/10/19   [name], MD    Family History Family History  Problem Relation Age of Onset  . Stroke Father   . Heart disease Mother   . Hypertension Mother   . Heart attack Mother   . Seizures Brother   . Hypertension Brother   . Diabetes Sister   . Hypertension Sister   . Heart disease Sister   . Breast cancer Sister   . Diabetes Sister   . Hypertension Sister   . Colon cancer Neg Hx     Social History Social History   Tobacco Use  . Smoking status: Never Smoker  . Smokeless tobacco: Never Used  Substance Use Topics  . Alcohol use: No  . Drug use: No     Allergies   Patient has no known allergies.   Review of Systems Review of Systems  All other systems reviewed and are negative.    Physical Exam Updated Vital Signs BP (!) 160/70   Pulse 70   Temp 98.1 F (36.7 C) (Oral)   Resp (!) 22   Ht  (1.626 m)   Wt 115.2 kg   SpO2 98%   BMI 43.60 kg/m   Physical Exam Vitals signs and nursing note reviewed.  Constitutional:      General: She is not in acute distress.    Appearance: She is well-developed. She is obese. She is not ill-appearing, toxic-appearing or diaphoretic.  HENT:     Head: Normocephalic and atraumatic.     Right Ear: Tympanic membrane, ear canal and external ear normal.     Left Ear: Tympanic membrane, ear canal and external ear normal.     Nose: No congestion or rhinorrhea.     Mouth/Throat:     Mouth: Mucous membranes are moist.     Pharynx: No oropharyngeal exudate.  Eyes:     Conjunctiva/sclera: Conjunctivae normal.     Pupils:  Pupils are equal, round, and reactive to light.  Neck:     Musculoskeletal: Normal range of motion and neck supple.  Trachea: Phonation normal.  Cardiovascular:     Rate and Rhythm: Normal rate and regular rhythm.     Heart sounds: Normal heart sounds.  Pulmonary:     Effort: Pulmonary effort is normal.     Comments: Somewhat decreased air movement bilaterally.  No wheezes rales or rhonchi.  No increased work of breathing. Chest:     Chest wall: No deformity or tenderness.  Abdominal:     Palpations: Abdomen is soft.     Tenderness: There is no abdominal tenderness.  Musculoskeletal: Normal range of motion.     Right lower leg: Edema present.     Left lower leg: Edema present.  Skin:    General: Skin is warm and dry.     Coloration: Skin is not jaundiced or pale.     Findings: No erythema.  Neurological:     Mental Status: She is alert and oriented to person, place, and time.     Cranial Nerves: No cranial nerve deficit.     Sensory: No sensory deficit.     Motor: No abnormal muscle tone.     Coordination: Coordination normal.  Psychiatric:        Behavior: Behavior normal.        Thought Content: Thought content normal.        Judgment: Judgment normal.      ED Treatments / Results  Labs (all labs ordered are listed, but only abnormal results are displayed) Labs Reviewed  BASIC METABOLIC PANEL - Abnormal; Notable for the following components:      Result Value   Glucose, Bld 115 (*)    Creatinine, Ser 1.03 (*)    Calcium 8.6 (*)    GFR calc non Af Amer 54 (*)    All other components within normal limits  BRAIN NATRIURETIC PEPTIDE - Abnormal; Notable for the following components:   B Natriuretic Peptide 503.0 (*)    All other components within normal limits  CBC WITH DIFFERENTIAL/PLATELET - Abnormal; Notable for the following components:   WBC 12.1 (*)    RBC 3.31 (*)    Hemoglobin 10.6 (*)    HCT 32.9 (*)    Neutro Abs 9.1 (*)    Monocytes Absolute 1.2 (*)     All other components within normal limits    EKG EKG Interpretation  Date/Time:  Wednesday April 10 2019 12:23:34 EDT Ventricular Rate:  70 PR Interval:    QRS Duration: 100 QT Interval:  433 QTC Calculation: 468 R Axis:   49 Text Interpretation:  Sinus rhythm Probable anteroseptal infarct, old since last tracing no significant change Confirmed by [name] [phone]) on 04/10/2019 12:31:53 PM   Radiology Ct Angio Chest Pe W/cm &/or Wo Cm  Result Date: 04/10/2019 CLINICAL DATA:  Acute onset of shortness of breath and wheezing that began earlier today. Three-day history of headache, sinus pressure and earache. EXAM: CT ANGIOGRAPHY CHEST WITH CONTRAST TECHNIQUE: Multidetector CT imaging of the chest was performed using the standard protocol during bolus administration of intravenous contrast. Multiplanar CT image reconstructions and MIPs were obtained to evaluate the vascular anatomy. CONTRAST:  OMNIPAQUE IOHEXOL 350 MG/ML IV. COMPARISON:  None. FINDINGS: Cardiovascular: Contrast opacification of pulmonary arteries is good. No filling defects within either main pulmonary artery or their segmental branches in either lung to suggest pulmonary embolism. Heart size upper normal to borderline enlarged. No pericardial effusion. Mild LAD coronary atherosclerosis. Mild atherosclerosis involving the thoracic and proximal abdominal aorta without evidence of aneurysm. 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 Mediastinum/Nodes: Scattered normal sized lymph nodes throughout the mediastinum and in both hila. No pathologically enlarged mediastinal, hilar or axillary lymph nodes. No mediastinal masses. Normal-appearing esophagus. Normal-appearing thyroid gland. Lungs/Pleura: Linear atelectasis and/or scarring in the RIGHT LOWER LOBE and in the lingula. No confluent airspace consolidation. No ground-glass  airspace consolidation. No pulmonary parenchymal nodules or masses. No pleural effusions. Subpleural fat posteriorly bilaterally. Central airways patent with marked central bronchial wall thickening. Upper Abdomen: Unremarkable for the early arterial phase of enhancement. Musculoskeletal: Mild degenerative disc disease and spondylosis throughout the thoracic spine. No acute findings. Review of the MIP images confirms the above findings. IMPRESSION: 1. No evidence of pulmonary embolism. 2. Marked central bronchial wall thickening indicating asthma and/or bronchitis. No acute cardiopulmonary disease otherwise. 3. Mild LAD coronary atherosclerosis. Aortic Atherosclerosis, mild.  (ICD10-I70.0). Electronically Signed   By: Hulan Saas M.D.   On: 04/10/2019 16:30   Dg Chest Port 1 View  Result Date: 04/10/2019 CLINICAL DATA:  Shortness of breath. EXAM: PORTABLE CHEST 1 VIEW COMPARISON:  01/27/2017 FINDINGS: The heart size and mediastinal contours are within normal limits. Both lungs are clear. The visualized skeletal structures are unremarkable. IMPRESSION: No active disease. Electronically Signed   By: Francene Boyers M.D.   On: 04/10/2019 13:25    Procedures Procedures (including critical care time)  Medications Ordered in ED Medications  iohexol (OMNIPAQUE) 350 MG/ML injection 100 mL (100 mLs Intravenous Contrast Given 04/10/19 1613)  predniSONE (DELTASONE) tablet 60 mg (60 mg Oral Given 04/10/19 1821)     Initial Impression / Assessment and Plan / ED Course  I have reviewed the triage vital signs and the nursing notes.  Pertinent labs & imaging results that were available during my care of the patient were reviewed by me and considered in my medical decision making (see chart for details).  Clinical Course as of Apr 10 1025  Wed Apr 10, 2019  1421 Elevated  Brain natriuretic peptide(!) [EW]  1421 Normal except white count high, hemoglobin low  CBC with Differential(!) [EW]  1421 Normal  except glucose high, creatinine high, calcium low, GFR low  Basic metabolic panel(!) [EW]  1421 No CHF, or infiltrate, images reviewed by me  DG Chest Port 1 View [EW]    Clinical Course User Index [EW] [name], MD        Patient Vitals for the past 24 hrs:  BP Temp Temp src Pulse Resp SpO2 Height Weight  04/10/19 1800 (!) 160/70 98.1 F (36.7 C) Oral 70 (!) 22 98 % - -  04/10/19 1500 (!) 158/74 - - 64 20 97 % - -  04/10/19 1400 (!) 157/70 - - 75 20 97 % - -  04/10/19 1330 (!) 169/85 - - 65 (!) 22 96 % - -  04/10/19 1230 (!) 172/75 - - 70 20 97 % - -  04/10/19 1225 - - - - - -  (1.626 m) 115.2 kg  04/10/19 1224 (!) 167/78 98.6 F (37 C) Oral 70 (!) 22 98 % - -    3:20 PM Reevaluation with update and discussion. After initial assessment and treatment, an updated evaluation reveals patient has completed ambulation in the ED, and did not have hypoxia.  However she did become symptomatically short of breath and respiratory rate increased.  No wheezing at this time.  No respiratory distress but she  is tachypneic at 25.  Will order CT angiogram to evaluate for occult PE. [name]   Medical Decision Making: Eval. C/w asthma exacerbation d/t seasonal allergies. Doubt SBI, PNE or metabolic instability.  CRITICAL CARE- No Performed by: [name]  Nursing Notes Reviewed/ Care Coordinated Applicable Imaging Reviewed Interpretation of Laboratory Data incorporated into ED treatment  The patient appears reasonably screened and/or stabilized for discharge and I doubt any other medical condition or other [facility] requiring further screening, evaluation, or treatment in the ED at this time prior to discharge.  Plan: Home Medications- usual; Home Treatments- rest, fluids; return here if the recommended treatment, does not improve the symptoms; Recommended follow up- PCP 1 week and prn   Final Clinical Impressions(s) / ED Diagnoses   Final diagnoses:  Moderate asthma with  exacerbation, unspecified whether persistent  Allergic rhinitis due to other allergic trigger, unspecified seasonality    ED Discharge Orders         Ordered    predniSONE (DELTASONE) 10 MG tablet     04/10/19 1808    fluticasone (FLONASE) 50 MCG/ACT nasal spray  Daily     04/10/19 1808           [name], MD 04/11/19 1027

## 2019-04-10 NOTE — Discharge Instructions (Signed)
We sent prescriptions to your pharmacy, to treat your asthma exacerbation with seasonal allergies.  Make sure that you are getting plenty of rest and eating and drinking regularly.  Use your albuterol inhaler 2 puffs every 3 or 4 hours as needed for cough or trouble breathing.  Follow-up with your primary care doctor for checkup in 1 or 2 weeks, and as needed.

## 2019-04-10 NOTE — ED Triage Notes (Signed)
PT states starting having headache, sinus pressure, ear pain x3 days ago but then developed SOB and wheezing this am with no fever. PT denies any coughing.

## 2019-04-10 NOTE — ED Notes (Signed)
Ambulated Pt. Walked with her cane, stated she felt fine but was winded when she sat down.O2 stayed from 98-100.

## 2019-04-15 ENCOUNTER — Telehealth: Payer: Self-pay

## 2019-04-15 NOTE — Telephone Encounter (Signed)
Called pt to see if she could arrive earlier for EGD w/RMR tomorrow. Pt requested to reschedule procedure d/t she doesn't have transportation to take her tomorrow. Procedure rescheduled to 05/15/19 at 2:15pm. New instructions mailed. Endo scheduler informed.

## 2019-05-07 ENCOUNTER — Telehealth: Payer: Self-pay | Admitting: *Deleted

## 2019-05-07 NOTE — Telephone Encounter (Signed)
Called pt to inform her about COVID 19 screening required before her procedure.  Pt is scheduled for 05/10/2019.  Pt is aware to remain in quarantine until her procedure date once her testing is done.  Pt voiced understanding.

## 2019-05-10 ENCOUNTER — Other Ambulatory Visit (HOSPITAL_COMMUNITY)
Admission: RE | Admit: 2019-05-10 | Discharge: 2019-05-10 | Disposition: A | Discharge status: Home / Self Care | Payer: Medicare Other | Source: Ambulatory Visit | Attending: Internal Medicine | Admitting: Internal Medicine

## 2019-05-10 ENCOUNTER — Other Ambulatory Visit: Payer: Self-pay

## 2019-05-10 DIAGNOSIS — Z1159 Encounter for screening for other viral diseases: Secondary | ICD-10-CM | POA: Insufficient documentation

## 2019-05-11 LAB — NOVEL CORONAVIRUS, NAA (HOSP ORDER, SEND-OUT TO REF LAB; TAT 18-24 HRS): SARS-CoV-2, NAA: NOT DETECTED

## 2019-05-15 ENCOUNTER — Telehealth: Payer: Self-pay

## 2019-05-15 ENCOUNTER — Ambulatory Visit (HOSPITAL_COMMUNITY)
Admission: RE | Admit: 2019-05-15 | Discharge: 2019-05-15 | Disposition: A | Discharge status: Home / Self Care | Payer: Medicare Other | Attending: Internal Medicine | Admitting: Internal Medicine

## 2019-05-15 ENCOUNTER — Encounter (HOSPITAL_COMMUNITY): Payer: Self-pay

## 2019-05-15 ENCOUNTER — Encounter (HOSPITAL_COMMUNITY)
Admission: RE | Disposition: A | Discharge status: Home / Self Care | Payer: Self-pay | Source: Home / Self Care | Attending: Internal Medicine

## 2019-05-15 ENCOUNTER — Other Ambulatory Visit: Payer: Self-pay

## 2019-05-15 DIAGNOSIS — K208 Other esophagitis: Secondary | ICD-10-CM | POA: Diagnosis not present

## 2019-05-15 DIAGNOSIS — E119 Type 2 diabetes mellitus without complications: Secondary | ICD-10-CM | POA: Diagnosis not present

## 2019-05-15 DIAGNOSIS — Z820 Family history of epilepsy and other diseases of the nervous system: Secondary | ICD-10-CM | POA: Diagnosis not present

## 2019-05-15 DIAGNOSIS — Z7984 Long term (current) use of oral hypoglycemic drugs: Secondary | ICD-10-CM | POA: Insufficient documentation

## 2019-05-15 DIAGNOSIS — K5900 Constipation, unspecified: Secondary | ICD-10-CM | POA: Diagnosis not present

## 2019-05-15 DIAGNOSIS — R1011 Right upper quadrant pain: Secondary | ICD-10-CM | POA: Insufficient documentation

## 2019-05-15 DIAGNOSIS — Z833 Family history of diabetes mellitus: Secondary | ICD-10-CM | POA: Insufficient documentation

## 2019-05-15 DIAGNOSIS — K449 Diaphragmatic hernia without obstruction or gangrene: Secondary | ICD-10-CM | POA: Insufficient documentation

## 2019-05-15 DIAGNOSIS — Z7951 Long term (current) use of inhaled steroids: Secondary | ICD-10-CM | POA: Diagnosis not present

## 2019-05-15 DIAGNOSIS — M549 Dorsalgia, unspecified: Secondary | ICD-10-CM | POA: Diagnosis not present

## 2019-05-15 DIAGNOSIS — Z79899 Other long term (current) drug therapy: Secondary | ICD-10-CM | POA: Insufficient documentation

## 2019-05-15 DIAGNOSIS — Z823 Family history of stroke: Secondary | ICD-10-CM | POA: Insufficient documentation

## 2019-05-15 DIAGNOSIS — K210 Gastro-esophageal reflux disease with esophagitis: Secondary | ICD-10-CM | POA: Insufficient documentation

## 2019-05-15 DIAGNOSIS — Z90710 Acquired absence of both cervix and uterus: Secondary | ICD-10-CM | POA: Insufficient documentation

## 2019-05-15 DIAGNOSIS — Z7982 Long term (current) use of aspirin: Secondary | ICD-10-CM | POA: Diagnosis not present

## 2019-05-15 DIAGNOSIS — K228 Other specified diseases of esophagus: Secondary | ICD-10-CM | POA: Diagnosis not present

## 2019-05-15 DIAGNOSIS — G8929 Other chronic pain: Secondary | ICD-10-CM | POA: Insufficient documentation

## 2019-05-15 DIAGNOSIS — M109 Gout, unspecified: Secondary | ICD-10-CM | POA: Diagnosis not present

## 2019-05-15 DIAGNOSIS — Z803 Family history of malignant neoplasm of breast: Secondary | ICD-10-CM | POA: Insufficient documentation

## 2019-05-15 DIAGNOSIS — I10 Essential (primary) hypertension: Secondary | ICD-10-CM | POA: Insufficient documentation

## 2019-05-15 DIAGNOSIS — Z8249 Family history of ischemic heart disease and other diseases of the circulatory system: Secondary | ICD-10-CM | POA: Diagnosis not present

## 2019-05-15 DIAGNOSIS — J45909 Unspecified asthma, uncomplicated: Secondary | ICD-10-CM | POA: Insufficient documentation

## 2019-05-15 DIAGNOSIS — G43909 Migraine, unspecified, not intractable, without status migrainosus: Secondary | ICD-10-CM | POA: Insufficient documentation

## 2019-05-15 HISTORY — PX: ESOPHAGOGASTRODUODENOSCOPY: SHX5428

## 2019-05-15 LAB — GLUCOSE, CAPILLARY: Glucose-Capillary: 86 mg/dL (ref 70–99)

## 2019-05-15 SURGERY — EGD (ESOPHAGOGASTRODUODENOSCOPY)
Anesthesia: Moderate Sedation

## 2019-05-15 MED ORDER — MEPERIDINE HCL 100 MG/ML IJ SOLN
INTRAMUSCULAR | Status: DC | PRN
  Administered 2019-05-15: 25 mg via INTRAVENOUS
  Administered 2019-05-15: 15 mg via INTRAVENOUS

## 2019-05-15 MED ORDER — MEPERIDINE HCL 50 MG/ML IJ SOLN
INTRAMUSCULAR | Status: AC
  Filled 2019-05-15: qty 1

## 2019-05-15 MED ORDER — STERILE WATER FOR IRRIGATION IR SOLN
Status: DC | PRN
  Administered 2019-05-15: 2.5 mL

## 2019-05-15 MED ORDER — MIDAZOLAM HCL 5 MG/5ML IJ SOLN
INTRAMUSCULAR | Status: DC | PRN
  Administered 2019-05-15 (×2): 1 mg via INTRAVENOUS

## 2019-05-15 MED ORDER — LIDOCAINE VISCOUS HCL 2 % MT SOLN
OROMUCOSAL | Status: AC
  Filled 2019-05-15: qty 15

## 2019-05-15 MED ORDER — MIDAZOLAM HCL 5 MG/5ML IJ SOLN
INTRAMUSCULAR | Status: AC
  Filled 2019-05-15: qty 10

## 2019-05-15 MED ORDER — ONDANSETRON HCL 4 MG/2ML IJ SOLN
INTRAMUSCULAR | Status: DC | PRN
  Administered 2019-05-15: 4 mg via INTRAVENOUS

## 2019-05-15 MED ORDER — ONDANSETRON HCL 4 MG/2ML IJ SOLN
INTRAMUSCULAR | Status: AC
  Filled 2019-05-15: qty 2

## 2019-05-15 MED ORDER — LIDOCAINE VISCOUS HCL 2 % MT SOLN
OROMUCOSAL | Status: DC | PRN
  Administered 2019-05-15: 2.5 mL via OROMUCOSAL

## 2019-05-15 NOTE — Discharge Instructions (Signed)
EGD Discharge instructions Please read the instructions outlined below and refer to this sheet in the next few weeks. These discharge instructions provide you with general information on caring for yourself after you leave the hospital. Your doctor may also give you specific instructions. While your treatment has been planned according to the most current medical practices available, unavoidable complications occasionally occur. If you have any problems or questions after discharge, please call your doctor. ACTIVITY  You may resume your regular activity but move at a slower pace for the next 24 hours.   Take frequent rest periods for the next 24 hours.   Walking will help expel (get rid of) the air and reduce the bloated feeling in your abdomen.   No driving for 24 hours (because of the anesthesia (medicine) used during the test).   You may shower.   Do not sign any important legal documents or operate any machinery for 24 hours (because of the anesthesia used during the test).  NUTRITION  Drink plenty of fluids.   You may resume your normal diet.   Begin with a light meal and progress to your normal diet.   Avoid alcoholic beverages for 24 hours or as instructed by your caregiver.  MEDICATIONS  You may resume your normal medications unless your caregiver tells you otherwise.  WHAT YOU CAN EXPECT TODAY  You may experience abdominal discomfort such as a feeling of fullness or gas pains.  FOLLOW-UP  Your doctor will discuss the results of your test with you.  SEEK IMMEDIATE MEDICAL ATTENTION IF ANY OF THE FOLLOWING OCCUR:  Excessive nausea (feeling sick to your stomach) and/or vomiting.   Severe abdominal pain and distention (swelling).   Trouble swallowing.   Temperature over 101 F (37.8 C).   Rectal bleeding or vomiting of blood.   GERD information provided  Stop omeprazole; begin Dexilant 60 mg daily x2 weeks.  Go by my office for free samples.  They are waiting  for you there.  Take this medication and call us in 2 weeks and see if it helps your pain.  If it does not, further evaluation with CAT scan will be needed.  Further recommendations to follow.  At patient request, I called Lauraine Rinne, [phone], and conveyed impression and recommendations.

## 2019-05-15 NOTE — Telephone Encounter (Signed)
Samples of Dexilant 60 mg one tab po daily are ready for pick up in office today per RMR. Pt is aware.

## 2019-05-15 NOTE — Op Note (Signed)
[facility] Patient Name: Brianna Manning Procedure Date: 05/15/2019 1:39 PM MRN: [id number] Date of Birth: 15-Dec-1945 Attending MD: Gennette Pac , MD CSN: [id number] Age: 73 Admit Type: Outpatient Procedure:                Upper GI endoscopy Indications:              Abdominal pain in the right upper quadrant Providers:                Gennette Pac, MD, Dayton Scrape RN,                            RN, Pandora Leiter, Technician Referring MD:             Avon Gully MD, MD Medicines:                Meperidine 40 mg IV, Midazolam 2 mg IV Complications:            No immediate complications. Estimated Blood Loss:     Estimated blood loss: none. Procedure:                Pre-Anesthesia Assessment:                           - ASA Grade Assessment: II - A patient with mild                            systemic disease.                           - Prior to the procedure, a History and Physical                            was performed, and patient medications and                            allergies were reviewed. The patient's tolerance of                            previous anesthesia was also reviewed. The risks                            and benefits of the procedure and the sedation                            options and risks were discussed with the patient.                            All questions were answered, and informed consent                            was obtained. ASA Grade Assessment: II - A patient                            with mild systemic disease. After reviewing the  risks and benefits, the patient was deemed in                            satisfactory condition to undergo the procedure.                           After obtaining informed consent, the endoscope was                            passed under direct vision. Throughout the                            procedure, the patient's blood pressure, pulse, and                      oxygen saturations were monitored continuously. The                            [id number]-[id number] ([id number]) was introduced through the and                            advanced to the second part of duodenum. The upper                            GI endoscopy was accomplished without difficulty.                            The patient tolerated the procedure well. The upper                            GI endoscopy was accomplished without difficulty.                            The patient tolerated the procedure well. Scope In: 1:59:50 PM Scope Out: 2:02:33 PM Total Procedure Duration: 0 hours 2 minutes 43 seconds  Findings:      Non-severe esophagitis was found. Couple 5 mm erosions within battling       the GE junction. No Barrett's epithelium.      A small hiatal hernia was present.      The exam was otherwise without abnormality.      The duodenal bulb and second portion of the duodenum were normal. Impression:               - No specimens collected. Patient's right upper                            quadrant pain may be related to reflux.                           - Non-severe esophagitis. Couple of 5mm erosions at                            the GE junction. No Barrett's epithelium seen.                           -  Small hiatal hernia.                           - The examination was otherwise normal.                           - Normal duodenal bulb.                           - Normal second portion of the duodenum. Moderate Sedation:      Moderate (conscious) sedation was administered by the endoscopy nurse       and supervised by the endoscopist. The following parameters were       monitored: oxygen saturation, heart rate, blood pressure, respiratory       rate, EKG, adequacy of pulmonary ventilation and response to care. Total       physician intraservice time was 11 minutes. Recommendation:           - Patient has a contact number available for                             emergencies. The signs and symptoms of potential                            delayed complications were discussed with the                            patient. Return to normal activities tomorrow.                            Written discharge instructions were provided to the                            patient. .                           - Resume previous diet.                           -Stop omeprazole; 2-week trial of Dexilant                             daily. Patient to go by my office for free samples.                            Have asked for progress report in 2 weeks. If right                            upper quadrant pain not improved in 2 weeks, will                            proceed with a CT scan as the next step in the                            evaluation.                           -  No repeat upper endoscopy. Procedure Code(s):        --- Professional ---                           [id number], Esophagogastroduodenoscopy, flexible,                            transoral; diagnostic, including collection of                            specimen(s) by brushing or washing, when performed                            (separate procedure)                           G0500, Moderate sedation services provided by the                            same physician or other qualified health care                            professional performing a gastrointestinal                            endoscopic service that sedation supports,                            requiring the presence of an independent trained                            observer to assist in the monitoring of the                            patient's level of consciousness and physiological                            status; initial 15 minutes of intra-service time;                            patient age 72 years or older (additional time may                            be reported with [id number], as appropriate) Diagnosis Code(s):        ---  Professional ---                           K20.8, Other esophagitis                           K44.9, Diaphragmatic hernia without obstruction or                            gangrene  R10.11, Right upper quadrant pain CPT copyright 2019 [employer]. All rights reserved. The codes documented in this report are preliminary and upon coder review may  be revised to meet current compliance requirements. Gerrit Friends. Tkeyah Burkman, MD Gennette Pac, MD 05/15/2019 3:04:45 PM This report has been signed electronically. Number of Addenda: 0

## 2019-05-15 NOTE — H&P (Signed)
@LOGO @   Primary Care Physician:  Avon Gully, MD Primary Gastroenterologist:  Dr. Jena Gauss  Pre-Procedure History & Physical: HPI:  Brianna Manning is a 73 y.o. female here for further evaluation of reflux, right upper quadrant abdominal pain. No dysphagia.  Takes omeprazole 20 mg once daily. Amitiza working for constipation nicely.  No side effects apparent.   Past Medical History:  Diagnosis Date  . Asthma   . Chronic back pain   . Diabetes (HCC)   . Gout   . History of Holter monitoring 2012   for palpitations and dyspnea; no acute findings  . Hypertension   . Migraine     Past Surgical History:  Procedure Laterality Date  . ABDOMINAL HYSTERECTOMY    . BREAST BIOPSY Right    benign  . BREAST SURGERY     biopsy  . COLONOSCOPY  01/2011   Dr. Jena Gauss: anal papilla, normal colon. Next TCS 2022.     Prior to Admission medications   Medication Sig Start Date End Date Taking? Authorizing Provider  albuterol (PROVENTIL HFA;VENTOLIN HFA) 108 (90 Base) MCG/ACT inhaler Inhale 2 puffs into the lungs every 6 (six) hours as needed for wheezing or shortness of breath.   Yes [provider]  Ascorbic Acid (VITAMIN C) 100 MG tablet Take 100 mg by mouth as needed.    Yes [provider]  aspirin EC 81 MG tablet Take 81 mg by mouth daily as needed for mild pain or moderate pain.    Yes [provider]  atorvastatin (LIPITOR) 20 MG tablet Take 20 mg by mouth every morning.   Yes [provider]  fluticasone (FLONASE) 50 MCG/ACT nasal spray Place 1 spray into both nostrils daily. 04/10/19  Yes [name], MD  indomethacin (INDOCIN) 25 MG capsule Take 1 capsule (25 mg total) by mouth 3 (three) times daily with meals. May take up to 50mg  three times a day if no improvement with 25mg . 01/27/19  Yes Eber Hong, MD  Boris Lown Oil 350 MG CAPS Take 1 capsule by mouth daily.   Yes [provider]  losartan (COZAAR) 100 MG tablet Take 100 mg by mouth  every morning.   Yes [provider]  lubiprostone (AMITIZA) 8 MCG capsule Take 1 capsule (8 mcg total) by mouth 2 (two) times daily with a meal. 02/18/19  Yes Tiffany Kocher, PA-C  metFORMIN (GLUCOPHAGE) 500 MG tablet Take 500 mg by mouth 2 (two) times daily with a meal.   Yes [provider]  metoprolol tartrate (LOPRESSOR) 50 MG tablet TAKE 1 TABLET BY MOUTH TWICE DAILY. Patient taking differently: Take 50 mg by mouth 2 (two) times daily.  10/26/18  Yes Branch, Dorothe Pea, MD  naphazoline-pheniramine (NAPHCON-A) 0.025-0.3 % ophthalmic solution Place 1 drop into both eyes 4 (four) times daily as needed for eye irritation.   Yes [provider]  omega-3 fish oil (MAXEPA) 1000 MG CAPS capsule Take 1 capsule by mouth 2 (two) times daily.   Yes [provider]  omeprazole (PRILOSEC) 20 MG capsule Take 20 mg by mouth every morning.  08/13/15  Yes [provider]  prednisoLONE acetate (PRED FORTE) 1 % ophthalmic suspension See admin instructions. Instill 4 drops into each ear twice daily as needed for pain   Yes [provider]  predniSONE (DELTASONE) 10 MG tablet Take q day 6,5,4,3,2,1 04/10/19  Yes [name], MD  Vitamin D, Cholecalciferol, 25 MCG (1000 UT) TABS Take 1 tablet by mouth daily.  Yes [provider]    Allergies as of 02/18/2019  . (No Known Allergies)    Family History  Problem Relation Age of Onset  . Stroke Father   . Heart disease Mother   . Hypertension Mother   . Heart attack Mother   . Seizures Brother   . Hypertension Brother   . Diabetes Sister   . Hypertension Sister   . Heart disease Sister   . Breast cancer Sister   . Diabetes Sister   . Hypertension Sister   . Colon cancer Neg Hx     Social History   Socioeconomic History  . Marital status: Divorced    Spouse name: Not on file  . Number of children: Not on file  . Years of education: Not on file  . Highest education level: Not on file   Occupational History  . Not on file  Social Needs  . Financial resource strain: Not on file  . Food insecurity:    Worry: Not on file    Inability: Not on file  . Transportation needs:    Medical: Not on file    Non-medical: Not on file  Tobacco Use  . Smoking status: Never Smoker  . Smokeless tobacco: Never Used  Substance and Sexual Activity  . Alcohol use: No  . Drug use: No  . Sexual activity: Not Currently  Lifestyle  . Physical activity:    Days per week: Not on file    Minutes per session: Not on file  . Stress: Not on file  Relationships  . Social connections:    Talks on phone: Not on file    Gets together: Not on file    Attends religious service: Not on file    Active member of club or organization: Not on file    Attends meetings of clubs or organizations: Not on file    Relationship status: Not on file  . Intimate partner violence:    Fear of current or ex partner: Not on file    Emotionally abused: Not on file    Physically abused: Not on file    Forced sexual activity: Not on file  Other Topics Concern  . Not on file  Social History Narrative  . Not on file    Review of Systems: See HPI, otherwise negative ROS  Physical Exam: BP (!) 174/74   Pulse 62   Temp 98.3 F (36.8 C) (Oral)   SpO2 98%  General:   Alert,  Well-developed, well-nourished, pleasant and cooperative in NAD Neck:  Supple; no masses or thyromegaly. No significant cervical adenopathy. Lungs:  Clear throughout to auscultation.   No wheezes, crackles, or rhonchi. No acute distress. Heart:  Regular rate and rhythm; no murmurs, clicks, rubs,  or gallops. Abdomen: Non-distended, normal bowel sounds.  Soft and nontender without appreciable mass or hepatosplenomegaly.  Pulses:  Normal pulses noted. Extremities:  Without clubbing or edema.  Impression/Plan: 73 year old lady with chronic right upper quadrant abdominal pain.  Patient better with Amitiza.  EGD as the next step in  evaluation today per plan.  Patient denies dysphagia. The risks, benefits, limitations, alternatives and imponderables have been reviewed with the patient. Potential for esophageal dilation, biopsy, etc. have also been reviewed.  Questions have been answered. All parties agreeable.     Notice: This dictation was prepared with Dragon dictation along with smaller phrase technology. Any transcriptional errors that result from this process are unintentional and may not be corrected upon review.

## 2019-05-21 ENCOUNTER — Encounter (HOSPITAL_COMMUNITY): Payer: Self-pay | Admitting: Internal Medicine

## 2019-06-25 DIAGNOSIS — I10 Essential (primary) hypertension: Secondary | ICD-10-CM | POA: Diagnosis not present

## 2019-06-25 DIAGNOSIS — E1165 Type 2 diabetes mellitus with hyperglycemia: Secondary | ICD-10-CM | POA: Diagnosis not present

## 2019-06-25 DIAGNOSIS — N183 Chronic kidney disease, stage 3 (moderate): Secondary | ICD-10-CM | POA: Diagnosis not present

## 2019-08-02 ENCOUNTER — Other Ambulatory Visit (HOSPITAL_COMMUNITY): Payer: Self-pay | Admitting: Internal Medicine

## 2019-08-02 ENCOUNTER — Other Ambulatory Visit: Payer: Self-pay

## 2019-08-02 ENCOUNTER — Ambulatory Visit (HOSPITAL_COMMUNITY)
Admission: RE | Admit: 2019-08-02 | Discharge: 2019-08-02 | Disposition: A | Discharge status: Home / Self Care | Payer: Medicare Other | Source: Ambulatory Visit | Attending: Internal Medicine | Admitting: Internal Medicine

## 2019-08-02 DIAGNOSIS — J45901 Unspecified asthma with (acute) exacerbation: Secondary | ICD-10-CM | POA: Diagnosis not present

## 2019-08-02 DIAGNOSIS — E1165 Type 2 diabetes mellitus with hyperglycemia: Secondary | ICD-10-CM | POA: Diagnosis not present

## 2019-08-02 DIAGNOSIS — M25512 Pain in left shoulder: Secondary | ICD-10-CM | POA: Diagnosis not present

## 2019-08-02 DIAGNOSIS — M19012 Primary osteoarthritis, left shoulder: Secondary | ICD-10-CM | POA: Diagnosis not present

## 2019-08-26 ENCOUNTER — Other Ambulatory Visit (HOSPITAL_COMMUNITY): Payer: Self-pay | Admitting: Internal Medicine

## 2019-08-26 DIAGNOSIS — Z1231 Encounter for screening mammogram for malignant neoplasm of breast: Secondary | ICD-10-CM

## 2019-09-02 DIAGNOSIS — E1165 Type 2 diabetes mellitus with hyperglycemia: Secondary | ICD-10-CM | POA: Diagnosis not present

## 2019-09-02 DIAGNOSIS — N183 Chronic kidney disease, stage 3 (moderate): Secondary | ICD-10-CM | POA: Diagnosis not present

## 2019-09-02 DIAGNOSIS — Z23 Encounter for immunization: Secondary | ICD-10-CM | POA: Diagnosis not present

## 2019-10-01 DIAGNOSIS — I10 Essential (primary) hypertension: Secondary | ICD-10-CM | POA: Diagnosis not present

## 2019-10-01 DIAGNOSIS — E1165 Type 2 diabetes mellitus with hyperglycemia: Secondary | ICD-10-CM | POA: Diagnosis not present

## 2019-10-07 ENCOUNTER — Ambulatory Visit (HOSPITAL_COMMUNITY)
Admission: RE | Admit: 2019-10-07 | Discharge: 2019-10-07 | Disposition: A | Discharge status: Home / Self Care | Payer: Medicare Other | Source: Ambulatory Visit | Attending: Internal Medicine | Admitting: Internal Medicine

## 2019-10-07 ENCOUNTER — Other Ambulatory Visit: Payer: Self-pay

## 2019-10-07 DIAGNOSIS — Z1231 Encounter for screening mammogram for malignant neoplasm of breast: Secondary | ICD-10-CM | POA: Diagnosis not present

## 2019-11-01 DIAGNOSIS — I10 Essential (primary) hypertension: Secondary | ICD-10-CM | POA: Diagnosis not present

## 2019-11-01 DIAGNOSIS — N183 Chronic kidney disease, stage 3 unspecified: Secondary | ICD-10-CM | POA: Diagnosis not present

## 2019-12-01 DIAGNOSIS — I10 Essential (primary) hypertension: Secondary | ICD-10-CM | POA: Diagnosis not present

## 2019-12-01 DIAGNOSIS — N183 Chronic kidney disease, stage 3 unspecified: Secondary | ICD-10-CM | POA: Diagnosis not present

## 2020-01-01 DIAGNOSIS — E1165 Type 2 diabetes mellitus with hyperglycemia: Secondary | ICD-10-CM | POA: Diagnosis not present

## 2020-01-01 DIAGNOSIS — I10 Essential (primary) hypertension: Secondary | ICD-10-CM | POA: Diagnosis not present

## 2020-01-28 ENCOUNTER — Other Ambulatory Visit: Payer: Self-pay | Admitting: Cardiology

## 2020-02-01 DIAGNOSIS — I10 Essential (primary) hypertension: Secondary | ICD-10-CM | POA: Diagnosis not present

## 2020-02-01 DIAGNOSIS — E1165 Type 2 diabetes mellitus with hyperglycemia: Secondary | ICD-10-CM | POA: Diagnosis not present

## 2020-02-04 DIAGNOSIS — H40033 Anatomical narrow angle, bilateral: Secondary | ICD-10-CM | POA: Diagnosis not present

## 2020-02-04 DIAGNOSIS — H532 Diplopia: Secondary | ICD-10-CM | POA: Diagnosis not present

## 2020-02-04 DIAGNOSIS — H2513 Age-related nuclear cataract, bilateral: Secondary | ICD-10-CM | POA: Diagnosis not present

## 2020-02-21 ENCOUNTER — Other Ambulatory Visit: Payer: Self-pay | Admitting: Cardiology

## 2020-02-21 DIAGNOSIS — H2513 Age-related nuclear cataract, bilateral: Secondary | ICD-10-CM | POA: Diagnosis not present

## 2020-02-21 DIAGNOSIS — H40213 Acute angle-closure glaucoma, bilateral: Secondary | ICD-10-CM | POA: Diagnosis not present

## 2020-03-05 ENCOUNTER — Ambulatory Visit: Payer: Medicare Other | Attending: Internal Medicine

## 2020-03-05 DIAGNOSIS — Z23 Encounter for immunization: Secondary | ICD-10-CM

## 2020-03-05 NOTE — Progress Notes (Signed)
   Covid-19 Vaccination Clinic  Name:  Brianna Manning    MRN: [id number] DOB: [birth year]  03/05/2020  Brianna Manning was observed post Covid-19 immunization for 15 minutes without incident. She was provided with Vaccine Information Sheet and instruction to access the V-Safe system.   Brianna Manning was instructed to call 911 with any severe reactions post vaccine: Marland Kitchen Difficulty breathing  . Swelling of face and throat  . A fast heartbeat  . A bad rash all over body  . Dizziness and weakness   Immunizations Administered    Name Date Dose VIS Date Route   Moderna COVID-19 Vaccine 03/05/2020  9:57 AM 0.5 mL 11/12/2019 Intramuscular   Manufacturer: Moderna   Lot: [id number]   NDC: 35825-189-84

## 2020-03-06 DIAGNOSIS — N183 Chronic kidney disease, stage 3 unspecified: Secondary | ICD-10-CM | POA: Diagnosis not present

## 2020-03-06 DIAGNOSIS — Z0001 Encounter for general adult medical examination with abnormal findings: Secondary | ICD-10-CM | POA: Diagnosis not present

## 2020-03-06 DIAGNOSIS — I10 Essential (primary) hypertension: Secondary | ICD-10-CM | POA: Diagnosis not present

## 2020-03-06 DIAGNOSIS — E1165 Type 2 diabetes mellitus with hyperglycemia: Secondary | ICD-10-CM | POA: Diagnosis not present

## 2020-03-06 DIAGNOSIS — Z1389 Encounter for screening for other disorder: Secondary | ICD-10-CM | POA: Diagnosis not present

## 2020-03-06 DIAGNOSIS — N1831 Chronic kidney disease, stage 3a: Secondary | ICD-10-CM | POA: Diagnosis not present

## 2020-04-02 ENCOUNTER — Ambulatory Visit: Payer: Medicare Other | Attending: Internal Medicine

## 2020-04-02 DIAGNOSIS — Z23 Encounter for immunization: Secondary | ICD-10-CM

## 2020-04-02 NOTE — Progress Notes (Signed)
   Covid-19 Vaccination Clinic  Name:  Brianna Manning    MRN: [id number] DOB: February 04, 1946  04/02/2020  Brianna Manning was observed post Covid-19 immunization for 15 minutes without incident. She was provided with Vaccine Information Sheet and instruction to access the V-Safe system.   Brianna Manning was instructed to call 911 with any severe reactions post vaccine: Marland Kitchen Difficulty breathing  . Swelling of face and throat  . A fast heartbeat  . A bad rash all over body  . Dizziness and weakness   Immunizations Administered    Name Date Dose VIS Date Route   Moderna COVID-19 Vaccine 04/02/2020  9:46 AM 0.5 mL 11/2019 Intramuscular   Manufacturer: Moderna   Lot: [id number]   NDC: 80638-685-48

## 2020-04-06 DIAGNOSIS — I10 Essential (primary) hypertension: Secondary | ICD-10-CM | POA: Diagnosis not present

## 2020-04-06 DIAGNOSIS — J4530 Mild persistent asthma, uncomplicated: Secondary | ICD-10-CM | POA: Diagnosis not present

## 2020-05-06 DIAGNOSIS — E1165 Type 2 diabetes mellitus with hyperglycemia: Secondary | ICD-10-CM | POA: Diagnosis not present

## 2020-05-06 DIAGNOSIS — I10 Essential (primary) hypertension: Secondary | ICD-10-CM | POA: Diagnosis not present

## 2020-05-06 DIAGNOSIS — H25813 Combined forms of age-related cataract, bilateral: Secondary | ICD-10-CM | POA: Diagnosis not present

## 2020-06-08 DIAGNOSIS — E1165 Type 2 diabetes mellitus with hyperglycemia: Secondary | ICD-10-CM | POA: Diagnosis not present

## 2020-06-08 DIAGNOSIS — I10 Essential (primary) hypertension: Secondary | ICD-10-CM | POA: Diagnosis not present

## 2020-06-20 IMAGING — US US ABDOMEN COMPLETE
1 series · 14 of 25 positions shown · non-contrast
Comparison: 04/23/2018

CLINICAL DATA: Abdominal pain on and off for 2 years

EXAM:
ABDOMEN ULTRASOUND COMPLETE

[Series 1: us abdomen complete · 14 of 120 slices shown]
[im 1/120]
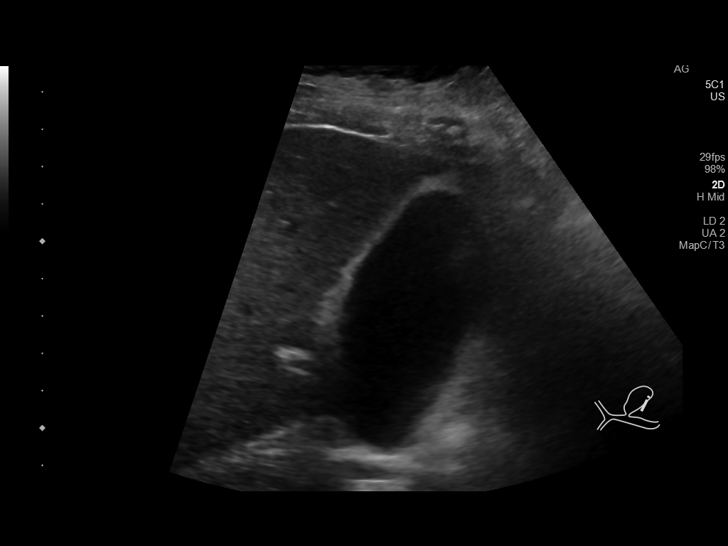
[im 10/120]
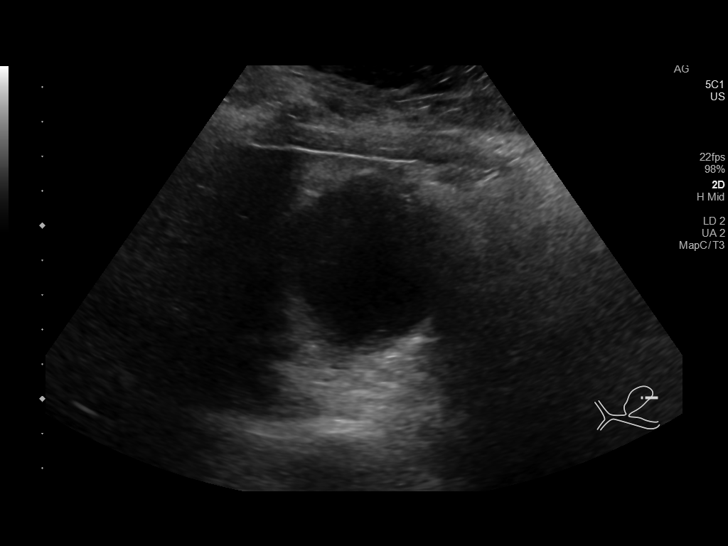
[im 20/120]
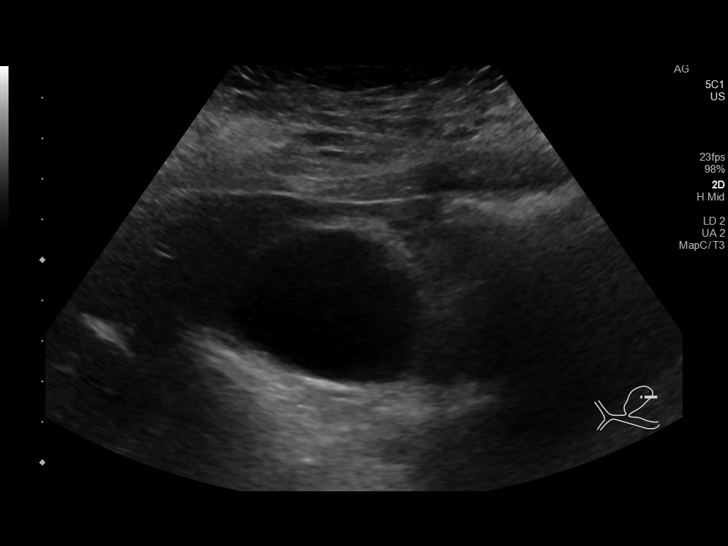
[im 30/120]
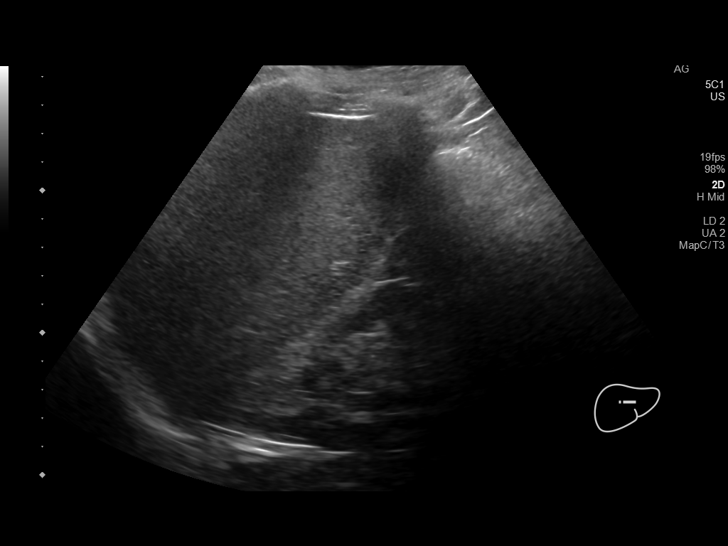
[im 40/120]
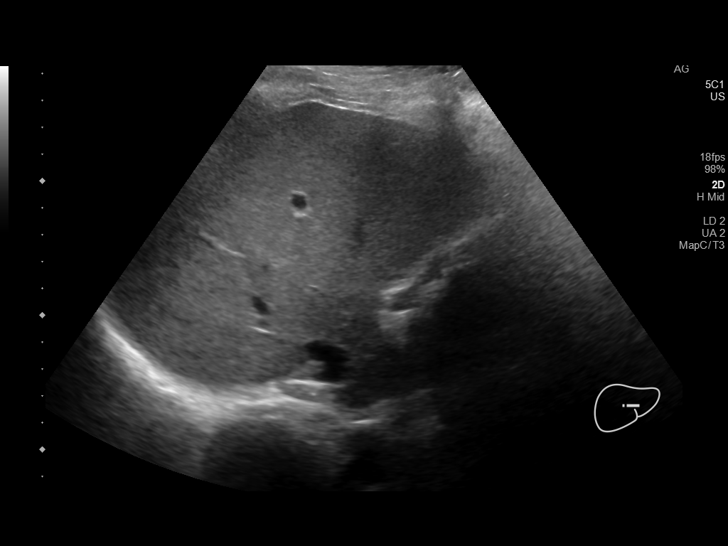
[im 45/120]
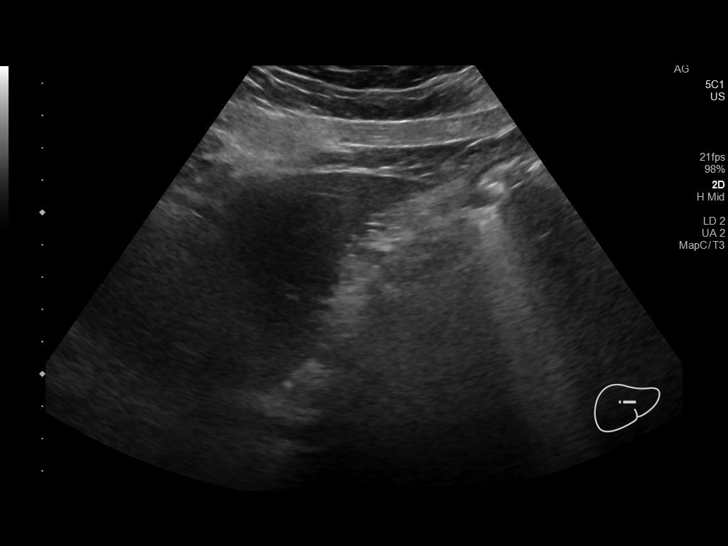
[im 55/120]
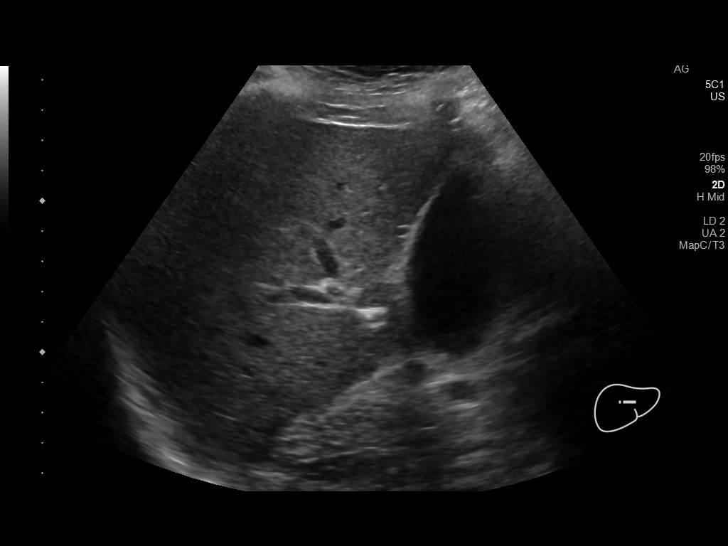
[im 65/120]
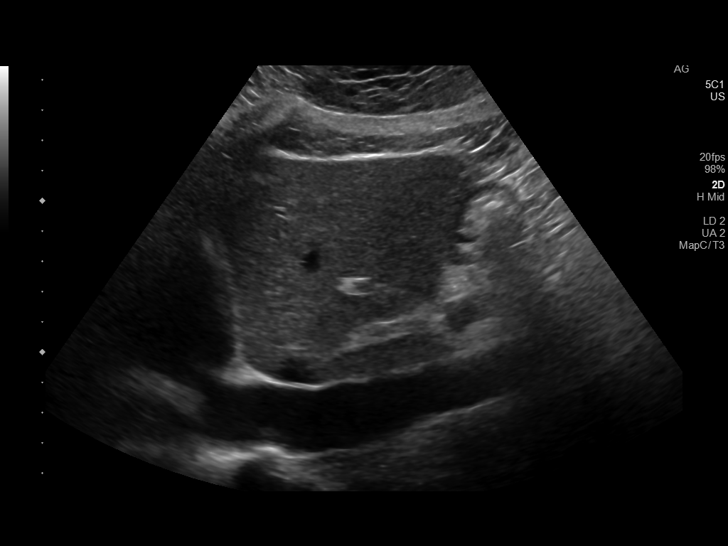
[im 75/120]
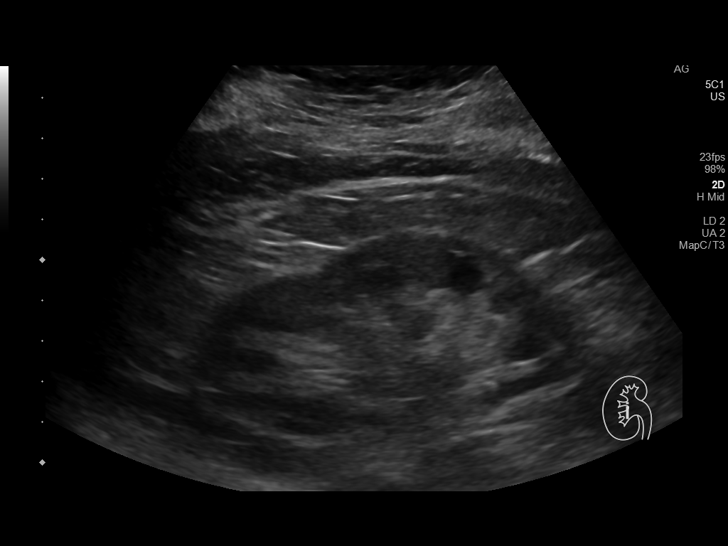
[im 80/120]
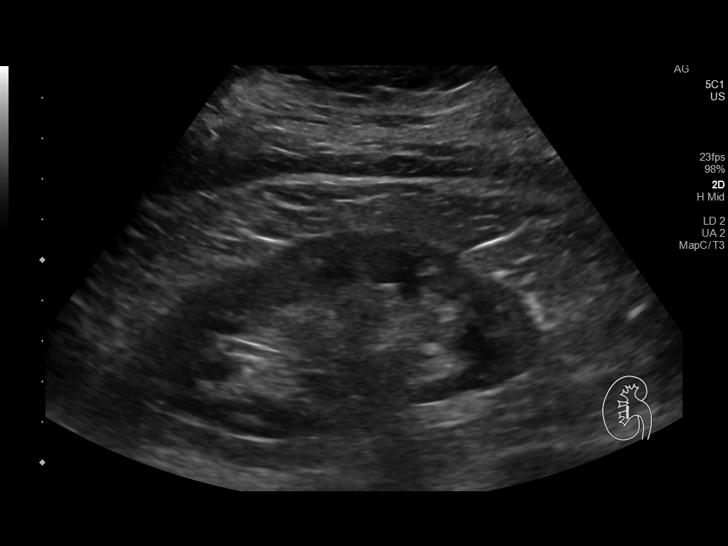
[im 90/120]
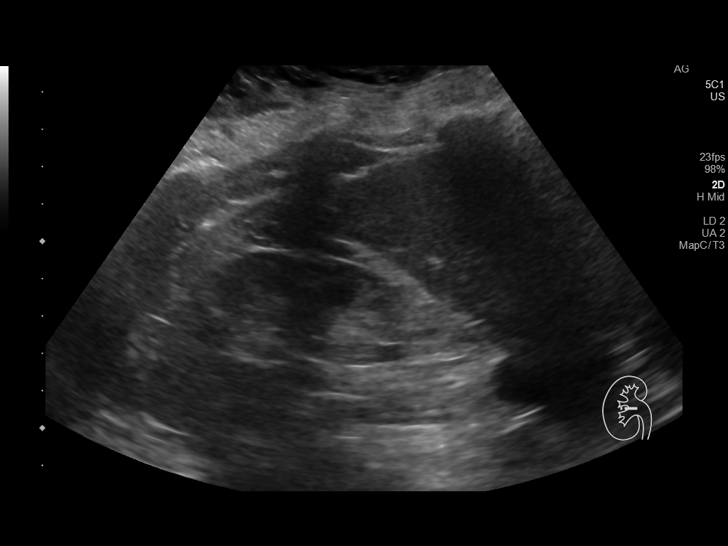
[im 100/120]
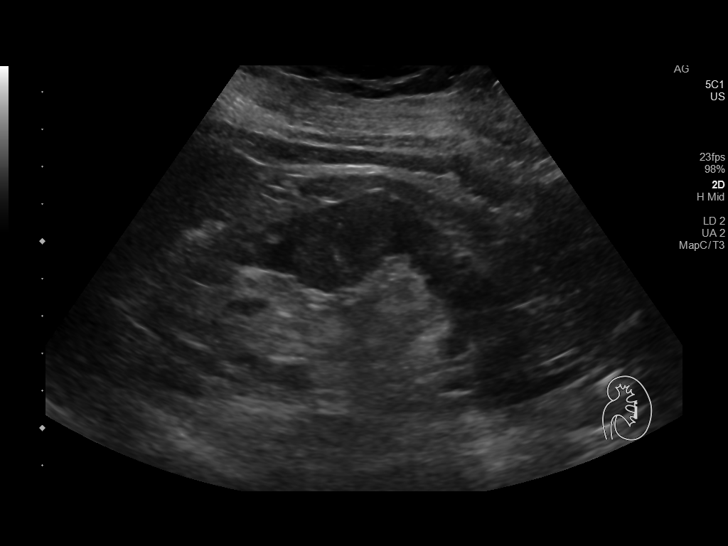
[im 110/120]
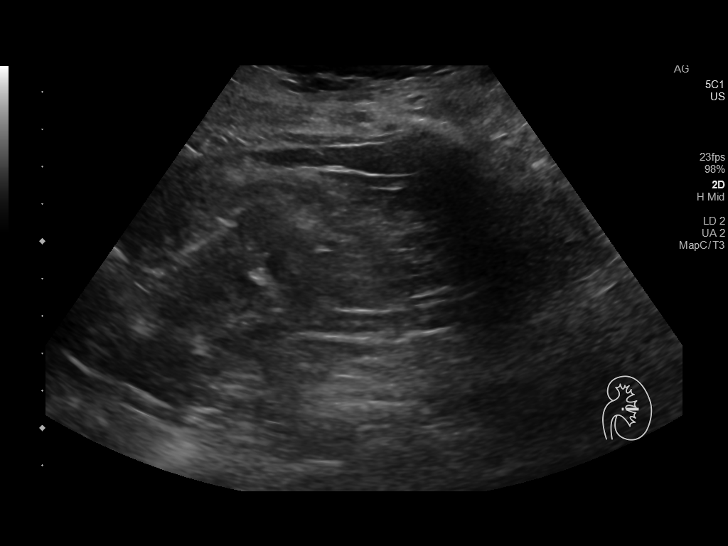
[im 120/120]
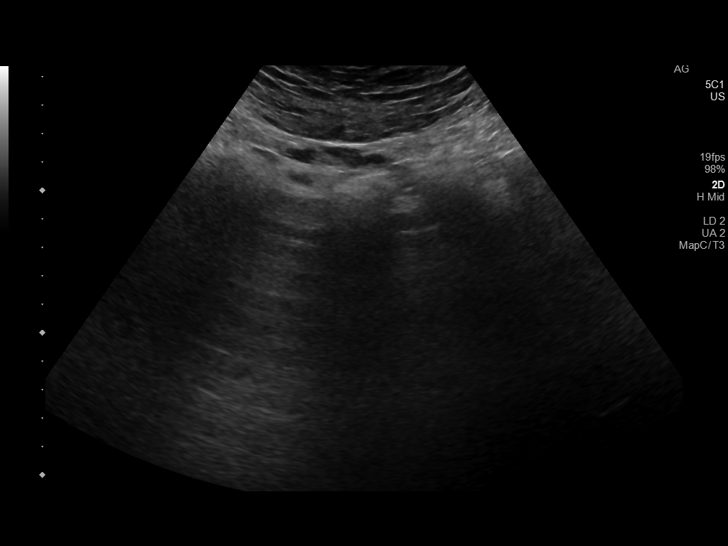

[14 of 25 positions shown; findings below may reference images not displayed]

FINDINGS: Gallbladder: No gallstones or wall thickening visualized. No
sonographic Murphy sign noted by sonographer.

Common bile duct: Diameter: 2 mm

Liver: No focal lesion identified. Within normal limits in
parenchymal echogenicity. Portal vein is patent on color Doppler
imaging with normal direction of blood flow towards the liver.

IVC: No abnormality visualized.

Pancreas: Visualized portion unremarkable.

Spleen: Size and appearance within normal limits.

Right Kidney: Length: 10 cm. Generalized borderline cortical
thinning (12 to 13mm cortical thickness) and increased echogenicity
with prominent corticomedullary differentiation. No mass or
hydronephrosis.

Left Kidney: Length: 10 cm. Generalized borderline cortical thinning
and increased echogenicity with prominent corticomedullary
differentiation. No mass or hydronephrosis.

Abdominal aorta: No aneurysm visualized.
IMPRESSION: Negative.  No explanation for abdominal pain.

## 2020-07-08 DIAGNOSIS — I10 Essential (primary) hypertension: Secondary | ICD-10-CM | POA: Diagnosis not present

## 2020-07-08 DIAGNOSIS — E1165 Type 2 diabetes mellitus with hyperglycemia: Secondary | ICD-10-CM | POA: Diagnosis not present

## 2020-07-23 DIAGNOSIS — M1990 Unspecified osteoarthritis, unspecified site: Secondary | ICD-10-CM | POA: Diagnosis not present

## 2020-07-23 DIAGNOSIS — I10 Essential (primary) hypertension: Secondary | ICD-10-CM | POA: Diagnosis not present

## 2020-07-23 DIAGNOSIS — E1165 Type 2 diabetes mellitus with hyperglycemia: Secondary | ICD-10-CM | POA: Diagnosis not present

## 2020-07-27 ENCOUNTER — Other Ambulatory Visit: Payer: Self-pay | Admitting: Cardiology

## 2020-08-11 DIAGNOSIS — M25579 Pain in unspecified ankle and joints of unspecified foot: Secondary | ICD-10-CM | POA: Diagnosis not present

## 2020-08-11 DIAGNOSIS — E1140 Type 2 diabetes mellitus with diabetic neuropathy, unspecified: Secondary | ICD-10-CM | POA: Diagnosis not present

## 2020-08-11 DIAGNOSIS — M79671 Pain in right foot: Secondary | ICD-10-CM | POA: Diagnosis not present

## 2020-08-11 DIAGNOSIS — M79672 Pain in left foot: Secondary | ICD-10-CM | POA: Diagnosis not present

## 2020-08-21 ENCOUNTER — Other Ambulatory Visit (HOSPITAL_COMMUNITY): Payer: Self-pay | Admitting: Internal Medicine

## 2020-08-21 DIAGNOSIS — Z1231 Encounter for screening mammogram for malignant neoplasm of breast: Secondary | ICD-10-CM

## 2020-08-26 DIAGNOSIS — H25813 Combined forms of age-related cataract, bilateral: Secondary | ICD-10-CM | POA: Diagnosis not present

## 2020-08-31 ENCOUNTER — Other Ambulatory Visit: Payer: Self-pay

## 2020-08-31 ENCOUNTER — Other Ambulatory Visit (HOSPITAL_COMMUNITY): Payer: Self-pay | Admitting: Internal Medicine

## 2020-08-31 ENCOUNTER — Ambulatory Visit (HOSPITAL_COMMUNITY)
Admission: RE | Admit: 2020-08-31 | Discharge: 2020-08-31 | Disposition: A | Discharge status: Home / Self Care | Payer: Medicare Other | Source: Ambulatory Visit | Attending: Internal Medicine | Admitting: Internal Medicine

## 2020-08-31 DIAGNOSIS — M19011 Primary osteoarthritis, right shoulder: Secondary | ICD-10-CM | POA: Diagnosis not present

## 2020-08-31 DIAGNOSIS — I10 Essential (primary) hypertension: Secondary | ICD-10-CM | POA: Diagnosis not present

## 2020-08-31 DIAGNOSIS — M25511 Pain in right shoulder: Secondary | ICD-10-CM | POA: Insufficient documentation

## 2020-08-31 DIAGNOSIS — J4530 Mild persistent asthma, uncomplicated: Secondary | ICD-10-CM | POA: Diagnosis not present

## 2020-08-31 DIAGNOSIS — E1165 Type 2 diabetes mellitus with hyperglycemia: Secondary | ICD-10-CM | POA: Diagnosis not present

## 2020-09-01 DIAGNOSIS — M25579 Pain in unspecified ankle and joints of unspecified foot: Secondary | ICD-10-CM | POA: Diagnosis not present

## 2020-09-01 DIAGNOSIS — M79671 Pain in right foot: Secondary | ICD-10-CM | POA: Diagnosis not present

## 2020-09-01 DIAGNOSIS — E1140 Type 2 diabetes mellitus with diabetic neuropathy, unspecified: Secondary | ICD-10-CM | POA: Diagnosis not present

## 2020-09-01 DIAGNOSIS — M79672 Pain in left foot: Secondary | ICD-10-CM | POA: Diagnosis not present

## 2020-09-10 DIAGNOSIS — E1140 Type 2 diabetes mellitus with diabetic neuropathy, unspecified: Secondary | ICD-10-CM | POA: Diagnosis not present

## 2020-09-10 DIAGNOSIS — M79674 Pain in right toe(s): Secondary | ICD-10-CM | POA: Diagnosis not present

## 2020-09-10 DIAGNOSIS — I739 Peripheral vascular disease, unspecified: Secondary | ICD-10-CM | POA: Diagnosis not present

## 2020-09-10 DIAGNOSIS — M79671 Pain in right foot: Secondary | ICD-10-CM | POA: Diagnosis not present

## 2020-09-10 DIAGNOSIS — M79672 Pain in left foot: Secondary | ICD-10-CM | POA: Diagnosis not present

## 2020-09-30 DIAGNOSIS — E1165 Type 2 diabetes mellitus with hyperglycemia: Secondary | ICD-10-CM | POA: Diagnosis not present

## 2020-09-30 DIAGNOSIS — I10 Essential (primary) hypertension: Secondary | ICD-10-CM | POA: Diagnosis not present

## 2020-10-06 DIAGNOSIS — L110 Acquired keratosis follicularis: Secondary | ICD-10-CM | POA: Diagnosis not present

## 2020-10-06 DIAGNOSIS — Q828 Other specified congenital malformations of skin: Secondary | ICD-10-CM | POA: Diagnosis not present

## 2020-10-06 DIAGNOSIS — M79671 Pain in right foot: Secondary | ICD-10-CM | POA: Diagnosis not present

## 2020-10-07 DIAGNOSIS — Z23 Encounter for immunization: Secondary | ICD-10-CM | POA: Diagnosis not present

## 2020-10-08 ENCOUNTER — Ambulatory Visit (HOSPITAL_COMMUNITY)
Admission: RE | Admit: 2020-10-08 | Discharge: 2020-10-08 | Disposition: A | Discharge status: Home / Self Care | Payer: Medicare Other | Source: Ambulatory Visit | Attending: Internal Medicine | Admitting: Internal Medicine

## 2020-10-08 ENCOUNTER — Other Ambulatory Visit: Payer: Self-pay

## 2020-10-08 DIAGNOSIS — Z1231 Encounter for screening mammogram for malignant neoplasm of breast: Secondary | ICD-10-CM | POA: Insufficient documentation

## 2020-10-22 DIAGNOSIS — H25813 Combined forms of age-related cataract, bilateral: Secondary | ICD-10-CM | POA: Diagnosis not present

## 2020-10-31 DIAGNOSIS — I10 Essential (primary) hypertension: Secondary | ICD-10-CM | POA: Diagnosis not present

## 2020-10-31 DIAGNOSIS — E1165 Type 2 diabetes mellitus with hyperglycemia: Secondary | ICD-10-CM | POA: Diagnosis not present

## 2020-11-30 DIAGNOSIS — I10 Essential (primary) hypertension: Secondary | ICD-10-CM | POA: Diagnosis not present

## 2020-11-30 DIAGNOSIS — E1165 Type 2 diabetes mellitus with hyperglycemia: Secondary | ICD-10-CM | POA: Diagnosis not present

## 2020-12-17 NOTE — H&P (Signed)
Surgical History & Physical  Patient Name: Brianna Manning DOB: 08/10/1946  Surgery: Cataract extraction with intraocular lens implant phacoemulsification; Right Eye  Surgeon: [name] MD Surgery Date:  01/01/2021 Pre-Op Date:  12/17/2020  HPI: A 59 Yr. old female patient is referred by Dr Frederik Schmidt for cataract eval. 1. The patient complains of difficulty when viewing TV, reading closed caption, news scrolls on TV, which began 1 year ago. Both eyes are affected. The episode is gradual. The condition's severity increased since last visit. Symptoms occur when the patient is inside, outside and reading. The complaint is associated with glare. This is negatively affecting her quality of life.  Medical History: Cataracts Diabetes Hypercholesterolemia Lung Problems  Review of Systems Negative Allergic/Immunologic Negative Cardiovascular Negative Constitutional Negative Ear, Nose, Mouth & Throat Negative Endocrine Negative Eyes Negative Gastrointestinal Negative Genitourinary Negative Hemotologic/Lymphatic Negative Integumentary Negative Musculoskeletal Negative Neurological Negative Psychiatry Negative Respiratory  Social   Never smoked   Medication Fish Oil, Flonase, Ventolin, Vitamin C, Vitamin D3, Aspirin, Atorvastatin, Losartan Potassium, Metoprolol, Omeprazole, Ibuprofen, Amitiza, Hydrochlorothiazide, Atorvastatin, Losartan Potassium, Albuterol,   Sx/Procedures Breast Biopsy Neg,   Drug Allergies   NKDA  History & Physical: Heent:  Cataract, Right eye NECK: supple without bruits LUNGS: lungs clear to auscultation CV: regular rate and rhythm Abdomen: soft and non-tender  Impression & Plan: Assessment: 1.  COMBINED FORMS AGE RELATED CATARACT; Both Eyes (H25.813)  Plan: 1.  Cataract accounts for the patient's decreased vision. This visual impairment is not correctable with a tolerable change in glasses or contact lenses. Cataract surgery with an implantation  of a new lens should significantly improve the visual and functional status of the patient. Discussed all risks, benefits, alternatives, and potential complications. Discussed the procedures and recovery. Patient desires to have surgery. A-scan ordered and performed today for intra-ocular lens calculations. The surgery will be performed in order to improve vision for driving, reading, and for eye examinations. Recommend phacoemulsification with intra-ocular lens. Recommend Dextenza for post-operative pain and inflammation. Right Eye worse - first. Dilates well - shugarcaine by protocol.

## 2020-12-29 NOTE — Patient Instructions (Signed)
Your procedure is scheduled on:  01/01/2021               Report to [facility] at  6:30   AM.                Call this number if you have problems the morning of surgery: [phone]   Do not eat or drink :After Midnight.    Take these medicines the morning of surgery with A SIP OF WATER:     Metoprolol and omeprazole    Flonase if needed             No diabetic medication morning of procedure       Do not wear jewelry, make-up or nail polish.  Do not wear lotions, powders, or perfumes. You may wear deodorant.  Do not bring valuables to the hospital.  Contacts, dentures or bridgework may not be worn into surgery.  Patients discharged the day of surgery will not be allowed to drive home.  Name and phone number of your driver.                                                                                                                                       Cataract Surgery  A cataract is a clouding of the lens of the eye. When a lens becomes cloudy, vision is reduced based on the degree and nature of the clouding. Surgery may be needed to improve vision. Surgery removes the cloudy lens and usually replaces it with a substitute lens (intraocular lens, IOL). LET YOUR EYE DOCTOR KNOW ABOUT:  Allergies to food or medicine.   Medicines taken including herbs, eyedrops, over-the-counter medicines, and creams.   Use of steroids (by mouth or creams).   Previous problems with anesthetics or numbing medicine.   History of bleeding problems or blood clots.   Previous surgery.   Other health problems, including diabetes and kidney problems.   Possibility of pregnancy, if this applies.  RISKS AND COMPLICATIONS  Infection.   Inflammation of the eyeball (endophthalmitis) that can spread to both eyes (sympathetic ophthalmia).   Poor wound healing.   If an IOL is inserted, it can later fall out of proper position. This is very uncommon.   Clouding of the part of your eye that holds an IOL  in place. This is called an "after-cataract." These are uncommon, but easily treated.  BEFORE THE PROCEDURE  Do not eat or drink anything except small amounts of water for 8 to 12 before your surgery, or as directed by your caregiver.    Unless you are told otherwise, continue any eyedrops you have been prescribed.   Talk to your primary caregiver about all other medicines that you take (both prescription and non-prescription). In some cases, you may need to stop or change medicines near the time of your surgery. This is most important if you are taking blood-thinning  medicine. Do not stop medicines unless you are told to do so.   Arrange for someone to drive you to and from the procedure.   Do not put contact lenses in either eye on the day of your surgery.  PROCEDURE There is more than one method for safely removing a cataract. Your doctor can explain the differences and help determine which is best for you. Phacoemulsification surgery is the most common form of cataract surgery.  An injection is given behind the eye or eyedrops are given to make this a painless procedure.   A small cut (incision) is made on the edge of the clear, dome-shaped surface that covers the front of the eye (cornea).   A tiny probe is painlessly inserted into the eye. This device gives off ultrasound waves that soften and break up the cloudy center of the lens. This makes it easier for the cloudy lens to be removed by suction.   An IOL may be implanted.   The normal lens of the eye is covered by a clear capsule. Part of that capsule is intentionally left in the eye to support the IOL.   Your surgeon may or may not use stitches to close the incision.  There are other forms of cataract surgery that require a larger incision and stiches to close the eye. This approach is taken in cases where the doctor feels that the cataract cannot be easily removed using phacoemulsification. AFTER THE PROCEDURE  When an IOL is  implanted, it does not need care. It becomes a permanent part of your eye and cannot be seen or felt.   Your doctor will schedule follow-up exams to check on your progress.   Review your other medicines with your doctor to see which can be resumed after surgery.   Use eyedrops or take medicine as prescribed by your doctor.  Document Released: 11/17/2011 Document Reviewed: 11/14/2011 [facility] Patient Information 2012 [place], [place].  .Cataract Surgery Care After Refer to this sheet in the next few weeks. These instructions provide you with information on caring for yourself after your procedure. Your caregiver may also give you more specific instructions. Your treatment has been planned according to current medical practices, but problems sometimes occur. Call your caregiver if you have any problems or questions after your procedure.  HOME CARE INSTRUCTIONS   Avoid strenuous activities as directed by your caregiver.   Ask your caregiver when you can resume driving.   Use eyedrops or other medicines to help healing and control pressure inside your eye as directed by your caregiver.   Only take over-the-counter or prescription medicines for pain, discomfort, or fever as directed by your caregiver.   Do not to touch or rub your eyes.   You may be instructed to use a protective shield during the first few days and nights after surgery. If not, wear sunglasses to protect your eyes. This is to protect the eye from pressure or from being accidentally bumped.   Keep the area around your eye clean and dry. Avoid swimming or allowing water to hit you directly in the face while showering. Keep soap and shampoo out of your eyes.   Do not bend or lift heavy objects. Bending increases pressure in the eye. You can walk, climb stairs, and do light household chores.   Do not put a contact lens into the eye that had surgery until your caregiver says it is okay to do so.   Ask your doctor when you can  return to work. This will depend on the kind of work that you do. If you work in a dusty environment, you may be advised to wear protective eyewear for a period of time.   Ask your caregiver when it will be safe to engage in sexual activity.   Continue with your regular eye exams as directed by your caregiver.  What to expect:  It is normal to feel itching and mild discomfort for a few days after cataract surgery. Some fluid discharge is also common, and your eye may be sensitive to light and touch.   After 1 to 2 days, even moderate discomfort should disappear. In most cases, healing will take about 6 weeks.   If you received an intraocular lens (IOL), you may notice that colors are very bright or have a blue tinge. Also, if you have been in bright sunlight, everything may appear reddish for a few hours. If you see these color tinges, it is because your lens is clear and no longer cloudy. Within a few months after receiving an IOL, these extra colors should go away. When you have healed, you will probably need new glasses.  SEEK MEDICAL CARE IF:   You have increased bruising around your eye.   You have discomfort not helped by medicine.  SEEK IMMEDIATE MEDICAL CARE IF:   You have a  fever.   You have a worsening or sudden vision loss.   You have redness, swelling, or increasing pain in the eye.   You have a thick discharge from the eye that had surgery.  MAKE SURE YOU:  Understand these instructions.   Will watch your condition.   Will get help right away if you are not doing well or get worse.  Document Released: 06/17/2005 Document Revised: 11/17/2011 Document Reviewed: 07/22/2011 [facility] Patient Information 2012 [place], [place].    Monitored Anesthesia Care  Monitored anesthesia care is an anesthesia service for a medical procedure. Anesthesia is the loss of the ability to feel pain. It is produced by medications called anesthetics. It may affect a small area of your body  (local anesthesia), a large area of your body (regional anesthesia), or your entire body (general anesthesia). The need for monitored anesthesia care depends your procedure, your condition, and the potential need for regional or general anesthesia. It is often provided during procedures where:   General anesthesia may be needed if there are complications. This is because you need special care when you are under general anesthesia.    You will be under local or regional anesthesia. This is so that you are able to have higher levels of anesthesia if needed.    You will receive calming medications (sedatives). This is especially the case if sedatives are given to put you in a semi-conscious state of relaxation (deep sedation). This is because the amount of sedative needed to produce this state can be hard to predict. Too much of a sedative can produce general anesthesia. Monitored anesthesia care is performed by one or more caregivers who have special training in all types of anesthesia. You will need to meet with these caregivers before your procedure. During this meeting, they will ask you about your medical history. They will also give you instructions to follow. (For example, you will need to stop eating and drinking before your procedure. You may also need to stop or change medications you are taking.) During your procedure, your caregivers will stay with you. They will:   Watch your condition. This  includes watching you blood pressure, breathing, and level of pain.    Diagnose and treat problems that occur.    Give medications if they are needed. These may include calming medications (sedatives) and anesthetics.    Make sure you are comfortable.   Having monitored anesthesia care does not necessarily mean that you will be under anesthesia. It does mean that your caregivers will be able to manage anesthesia if you need it or if it occurs. It also means that you will be able to have a different type  of anesthesia than you are having if you need it. When your procedure is complete, your caregivers will continue to watch your condition. They will make sure any medications wear off before you are allowed to go home.  Document Released: 08/24/2005 Document Revised: 03/25/2013 Document Reviewed: 01/09/2013 [facility] Patient Information 2014 [place], [place].

## 2020-12-30 ENCOUNTER — Other Ambulatory Visit (HOSPITAL_COMMUNITY): Payer: Medicare Other

## 2020-12-30 ENCOUNTER — Encounter (HOSPITAL_COMMUNITY)
Admission: RE | Admit: 2020-12-30 | Discharge: 2020-12-30 | Disposition: A | Discharge status: Home / Self Care | Payer: Medicare Other | Source: Ambulatory Visit | Attending: Ophthalmology | Admitting: Ophthalmology

## 2020-12-31 DIAGNOSIS — I10 Essential (primary) hypertension: Secondary | ICD-10-CM | POA: Diagnosis not present

## 2020-12-31 DIAGNOSIS — E1165 Type 2 diabetes mellitus with hyperglycemia: Secondary | ICD-10-CM | POA: Diagnosis not present

## 2021-01-06 NOTE — H&P (Addendum)
Surgical History & Physical  Patient Name: Brianna Manning DOB: 01/07/1946  Surgery: Cataract extraction with intraocular lens implant phacoemulsification; Right Eye  Surgeon: [name] MD Surgery Date:  02/05/2021 Pre-Op Date:  01/12/2021  HPI: A 40 Yr. old female patient is referred by Dr Frederik Schmidt for cataract eval. 1. The patient complains of difficulty when viewing TV, reading closed caption, news scrolls on TV, which began 1 year ago. Both eyes are affected. The episode is gradual. The condition's severity increased since last visit. Symptoms occur when the patient is inside, outside and reading. The complaint is associated with glare. This is negatively affecting her quality of life.  Medical History: Glaucoma Cataracts Diabetes Hypercholesterolemia Lung Problems  Review of Systems Negative Allergic/Immunologic Negative Cardiovascular Negative Constitutional Negative Ear, Nose, Mouth & Throat Negative Endocrine Negative Eyes Negative Gastrointestinal Negative Genitourinary Negative Hemotologic/Lymphatic Negative Integumentary Negative Musculoskeletal Negative Neurological Negative Psychiatry Negative Respiratory  Social   Never smoked   Medication Fish Oil, Flonase, Ventolin, Vitamin C, Vitamin D3, Aspirin, Atorvastatin, Losartan Potassium, Metoprolol, Omeprazole, Ibuprofen, Amitiza, Hydrochlorothiazide, Atorvastatin, Losartan Potassium, Albuterol,   Sx/Procedures Breast Biopsy Neg,   Drug Allergies   NKDA  History & Physical: Heent:  Cataract, Right eye NECK: supple without bruits LUNGS: lungs clear to auscultation CV: regular rate and rhythm Abdomen: soft and non-tender  Impression & Plan: Assessment: 1.  COMBINED FORMS AGE RELATED CATARACT; Both Eyes (H25.813)  Plan: 1.  Cataract accounts for the patient's decreased vision. This visual impairment is not correctable with a tolerable change in glasses or contact lenses. Cataract surgery with an  implantation of a new lens should significantly improve the visual and functional status of the patient. Discussed all risks, benefits, alternatives, and potential complications. Discussed the procedures and recovery. Patient desires to have surgery. A-scan ordered and performed today for intra-ocular lens calculations. The surgery will be performed in order to improve vision for driving, reading, and for eye examinations. Recommend phacoemulsification with intra-ocular lens. Recommend Dextenza for post-operative pain and inflammation. Right Eye worse - first. Dilates well - shugarcaine by protocol.

## 2021-01-11 ENCOUNTER — Other Ambulatory Visit (HOSPITAL_COMMUNITY): Payer: Medicare Other

## 2021-01-13 ENCOUNTER — Other Ambulatory Visit (HOSPITAL_COMMUNITY): Admission: RE | Admit: 2021-01-13 | Payer: Medicare Other | Source: Ambulatory Visit

## 2021-01-13 ENCOUNTER — Other Ambulatory Visit (HOSPITAL_COMMUNITY): Payer: Medicare Other

## 2021-01-13 ENCOUNTER — Encounter (HOSPITAL_COMMUNITY): Admission: RE | Admit: 2021-01-13 | Payer: Medicare Other | Source: Ambulatory Visit

## 2021-01-13 ENCOUNTER — Encounter (HOSPITAL_COMMUNITY): Payer: Self-pay

## 2021-01-19 DIAGNOSIS — E1140 Type 2 diabetes mellitus with diabetic neuropathy, unspecified: Secondary | ICD-10-CM | POA: Diagnosis not present

## 2021-01-19 DIAGNOSIS — M79674 Pain in right toe(s): Secondary | ICD-10-CM | POA: Diagnosis not present

## 2021-01-19 DIAGNOSIS — I739 Peripheral vascular disease, unspecified: Secondary | ICD-10-CM | POA: Diagnosis not present

## 2021-01-19 DIAGNOSIS — L110 Acquired keratosis follicularis: Secondary | ICD-10-CM | POA: Diagnosis not present

## 2021-01-19 DIAGNOSIS — M79671 Pain in right foot: Secondary | ICD-10-CM | POA: Diagnosis not present

## 2021-01-19 DIAGNOSIS — M79672 Pain in left foot: Secondary | ICD-10-CM | POA: Diagnosis not present

## 2021-01-19 DIAGNOSIS — M79675 Pain in left toe(s): Secondary | ICD-10-CM | POA: Diagnosis not present

## 2021-01-27 ENCOUNTER — Other Ambulatory Visit: Payer: Self-pay | Admitting: Cardiology

## 2021-01-29 ENCOUNTER — Encounter (HOSPITAL_COMMUNITY)
Admission: RE | Admit: 2021-01-29 | Discharge: 2021-01-29 | Disposition: A | Discharge status: Home / Self Care | Payer: Medicare Other | Source: Ambulatory Visit | Attending: Ophthalmology | Admitting: Ophthalmology

## 2021-01-29 ENCOUNTER — Other Ambulatory Visit: Payer: Self-pay

## 2021-01-31 DIAGNOSIS — E1165 Type 2 diabetes mellitus with hyperglycemia: Secondary | ICD-10-CM | POA: Diagnosis not present

## 2021-01-31 DIAGNOSIS — I10 Essential (primary) hypertension: Secondary | ICD-10-CM | POA: Diagnosis not present

## 2021-02-01 DIAGNOSIS — H25811 Combined forms of age-related cataract, right eye: Secondary | ICD-10-CM | POA: Diagnosis not present

## 2021-02-02 ENCOUNTER — Other Ambulatory Visit (HOSPITAL_COMMUNITY): Payer: Medicare Other

## 2021-02-03 ENCOUNTER — Other Ambulatory Visit (HOSPITAL_COMMUNITY)
Admission: RE | Admit: 2021-02-03 | Discharge: 2021-02-03 | Disposition: A | Discharge status: Home / Self Care | Payer: Medicare Other | Source: Ambulatory Visit | Attending: Ophthalmology | Admitting: Ophthalmology

## 2021-02-03 ENCOUNTER — Other Ambulatory Visit (HOSPITAL_COMMUNITY): Payer: Medicare Other

## 2021-02-03 ENCOUNTER — Other Ambulatory Visit: Payer: Self-pay

## 2021-02-03 DIAGNOSIS — Z01812 Encounter for preprocedural laboratory examination: Secondary | ICD-10-CM | POA: Diagnosis not present

## 2021-02-03 DIAGNOSIS — Z20822 Contact with and (suspected) exposure to covid-19: Secondary | ICD-10-CM | POA: Insufficient documentation

## 2021-02-03 LAB — SARS CORONAVIRUS 2 (TAT 6-24 HRS): SARS Coronavirus 2: NEGATIVE

## 2021-02-05 ENCOUNTER — Ambulatory Visit (HOSPITAL_COMMUNITY): Payer: Medicare Other | Admitting: Anesthesiology

## 2021-02-05 ENCOUNTER — Encounter (HOSPITAL_COMMUNITY)
Admission: RE | Disposition: A | Discharge status: Home / Self Care | Payer: Self-pay | Source: Home / Self Care | Attending: Ophthalmology

## 2021-02-05 ENCOUNTER — Encounter (HOSPITAL_COMMUNITY): Payer: Self-pay | Admitting: Ophthalmology

## 2021-02-05 ENCOUNTER — Ambulatory Visit (HOSPITAL_COMMUNITY)
Admission: RE | Admit: 2021-02-05 | Discharge: 2021-02-05 | Disposition: A | Discharge status: Home / Self Care | Payer: Medicare Other | Attending: Ophthalmology | Admitting: Ophthalmology

## 2021-02-05 DIAGNOSIS — H25811 Combined forms of age-related cataract, right eye: Secondary | ICD-10-CM | POA: Diagnosis not present

## 2021-02-05 DIAGNOSIS — J45909 Unspecified asthma, uncomplicated: Secondary | ICD-10-CM | POA: Diagnosis not present

## 2021-02-05 DIAGNOSIS — E1136 Type 2 diabetes mellitus with diabetic cataract: Secondary | ICD-10-CM | POA: Diagnosis not present

## 2021-02-05 HISTORY — PX: CATARACT EXTRACTION W/PHACO: SHX586

## 2021-02-05 LAB — GLUCOSE, CAPILLARY: Glucose-Capillary: 101 mg/dL — ABNORMAL HIGH (ref 70–99)

## 2021-02-05 SURGERY — PHACOEMULSIFICATION, CATARACT, WITH IOL INSERTION
Anesthesia: Monitor Anesthesia Care | Laterality: Right

## 2021-02-05 MED ORDER — SODIUM HYALURONATE 23 MG/ML IO SOLN
INTRAOCULAR | Status: DC | PRN
  Administered 2021-02-05: 0.6 mL via INTRAOCULAR

## 2021-02-05 MED ORDER — TROPICAMIDE 1 % OP SOLN
1.0000 [drp] | OPHTHALMIC | Status: AC
  Administered 2021-02-05 (×3): 1 [drp] via OPHTHALMIC

## 2021-02-05 MED ORDER — PHENYLEPHRINE HCL 2.5 % OP SOLN
1.0000 [drp] | OPHTHALMIC | Status: AC | PRN
  Administered 2021-02-05 (×3): 1 [drp] via OPHTHALMIC

## 2021-02-05 MED ORDER — POVIDONE-IODINE 5 % OP SOLN
OPHTHALMIC | Status: DC | PRN
  Administered 2021-02-05: 1 via OPHTHALMIC

## 2021-02-05 MED ORDER — STERILE WATER FOR IRRIGATION IR SOLN
Status: DC | PRN
  Administered 2021-02-05: 250 mL

## 2021-02-05 MED ORDER — PROVISC 10 MG/ML IO SOLN
INTRAOCULAR | Status: DC | PRN
  Administered 2021-02-05: 0.85 mL via INTRAOCULAR

## 2021-02-05 MED ORDER — TETRACAINE HCL 0.5 % OP SOLN
1.0000 [drp] | OPHTHALMIC | Status: AC | PRN
  Administered 2021-02-05 (×3): 1 [drp] via OPHTHALMIC

## 2021-02-05 MED ORDER — LIDOCAINE HCL 3.5 % OP GEL
1.0000 "application " | Freq: Once | OPHTHALMIC | Status: AC
  Administered 2021-02-05: 1 via OPHTHALMIC

## 2021-02-05 MED ORDER — LIDOCAINE HCL (PF) 1 % IJ SOLN
INTRAOCULAR | Status: DC | PRN
  Administered 2021-02-05: 1 mL via OPHTHALMIC

## 2021-02-05 MED ORDER — MIDAZOLAM HCL 2 MG/2ML IJ SOLN
INTRAMUSCULAR | Status: AC
  Filled 2021-02-05: qty 2

## 2021-02-05 MED ORDER — NEOMYCIN-POLYMYXIN-DEXAMETH 3.5-10000-0.1 OP SUSP
OPHTHALMIC | Status: DC | PRN
  Administered 2021-02-05: 1 [drp] via OPHTHALMIC

## 2021-02-05 MED ORDER — MIDAZOLAM HCL 5 MG/5ML IJ SOLN
INTRAMUSCULAR | Status: DC | PRN
  Administered 2021-02-05: 2 mg via INTRAVENOUS

## 2021-02-05 MED ORDER — BSS IO SOLN
INTRAOCULAR | Status: DC | PRN
  Administered 2021-02-05: 15 mL via INTRAOCULAR

## 2021-02-05 MED ORDER — EPINEPHRINE PF 1 MG/ML IJ SOLN
INTRAOCULAR | Status: DC | PRN
  Administered 2021-02-05: 500 mL

## 2021-02-05 SURGICAL SUPPLY — 15 items
CLOTH BEACON ORANGE TIMEOUT ST (SAFETY) ×1 IMPLANT
DEVICE MILOOP (MISCELLANEOUS) IMPLANT
EYE SHIELD UNIVERSAL CLEAR (GAUZE/BANDAGES/DRESSINGS) ×1 IMPLANT
GLOVE SURG UNDER POLY LF SZ6.5 (GLOVE) ×1 IMPLANT
GLOVE SURG UNDER POLY LF SZ7 (GLOVE) ×1 IMPLANT
MILOOP DEVICE (MISCELLANEOUS)
NDL HYPO 18GX1.5 BLUNT FILL (NEEDLE) IMPLANT
NEEDLE HYPO 18GX1.5 BLUNT FILL (NEEDLE) ×2 IMPLANT
PAD ARMBOARD 7.5X6 YLW CONV (MISCELLANEOUS) ×1 IMPLANT
RING MALYGIN (MISCELLANEOUS) IMPLANT
RING MALYGIN 7.0 (MISCELLANEOUS) IMPLANT
SYR TB 1ML LL NO SAFETY (SYRINGE) ×1 IMPLANT
Technis 1-piece Intraocular Lens (Intraocular Lens) ×1 IMPLANT
VISCOELASTIC ADDITIONAL (OPHTHALMIC RELATED) IMPLANT
WATER STERILE IRR 250ML POUR (IV SOLUTION) ×1 IMPLANT

## 2021-02-05 NOTE — Discharge Instructions (Addendum)
PATIENT INSTRUCTIONS POST-ANESTHESIA  IMMEDIATELY FOLLOWING SURGERY:  Do not drive or operate machinery for the first twenty four hours after surgery.  Do not make any important decisions for twenty four hours after surgery or while taking narcotic pain medications or sedatives.  If you develop intractable nausea and vomiting or a severe headache please notify your doctor immediately.  FOLLOW-UP:  Please make an appointment with your surgeon as instructed. You do not need to follow up with anesthesia unless specifically instructed to do so.  WOUND CARE INSTRUCTIONS (if applicable):  Keep a dry clean dressing on the anesthesia/puncture wound site if there is drainage.  Once the wound has quit draining you may leave it open to air.  Generally you should leave the bandage intact for twenty four hours unless there is drainage.  If the epidural site drains for more than 36-48 hours please call the anesthesia department.  QUESTIONS?:  Please feel free to call your physician or the hospital operator if you have any questions, and they will be happy to assist you.      Please discharge patient when stable, will follow up today with Dr. Wrzosek at the [facility] office immediately following discharge.  Leave shield in place until visit.  All paperwork with discharge instructions will be given at the office.  [facility] [place] Address:  [address]  [place], [place] [address]  

## 2021-02-05 NOTE — Anesthesia Postprocedure Evaluation (Signed)
Anesthesia Post Note  Patient: ERIENNE SPELMAN  Procedure(s) Performed: CATARACT EXTRACTION PHACO AND INTRAOCULAR LENS PLACEMENT (IOC) (Right )  Patient location during evaluation: Short Stay Anesthesia Type: MAC Level of consciousness: awake and alert and oriented Pain management: pain level controlled Vital Signs Assessment: post-procedure vital signs reviewed and stable Respiratory status: spontaneous breathing Cardiovascular status: blood pressure returned to baseline and stable Postop Assessment: no apparent nausea or vomiting Anesthetic complications: no   No complications documented.   Last Vitals:  Vitals:   02/05/21 0955 02/05/21 1105  BP: 133/64 126/75  Pulse: 62 61  Resp: 18   Temp: 36.6 C 36.6 C  SpO2: 100% 100%    Last Pain:  Vitals:   02/05/21 1105  TempSrc: Oral  PainSc: 0-No pain                 Claire Bridge

## 2021-02-05 NOTE — Transfer of Care (Signed)
Immediate Anesthesia Transfer of Care Note  Patient: Brianna Manning  Procedure(s) Performed: CATARACT EXTRACTION PHACO AND INTRAOCULAR LENS PLACEMENT (IOC) (Right )  Patient Location: Short Stay  Anesthesia Type:MAC  Level of Consciousness: awake  Airway & Oxygen Therapy: Patient Spontanous Breathing  Post-op Assessment: Report given to RN  Post vital signs: Reviewed  Last Vitals:  Vitals Value Taken Time  BP 126/75 02/05/21 1105  Temp 36.6 C 02/05/21 1105  Pulse 61 02/05/21 1105  Resp    SpO2 100 % 02/05/21 1105    Last Pain:  Vitals:   02/05/21 1105  TempSrc: Oral  PainSc: 0-No pain      Patients Stated Pain Goal: 8 (16/94/50 3888)  Complications: No complications documented.

## 2021-02-05 NOTE — Anesthesia Preprocedure Evaluation (Signed)
Anesthesia Evaluation  Patient identified by MRN, date of birth, ID band Patient awake    Reviewed: Allergy & Precautions, H&P , NPO status , Patient's Chart, lab work & pertinent test results, reviewed documented beta blocker date and time   Airway Mallampati: II  TM Distance: >3 FB Neck ROM: full    Dental no notable dental hx.    Pulmonary asthma ,    Pulmonary exam normal breath sounds clear to auscultation       Cardiovascular Exercise Tolerance: Good hypertension, negative cardio ROS   Rhythm:regular Rate:Normal     Neuro/Psych  Headaches, negative psych ROS   GI/Hepatic Neg liver ROS, GERD  Medicated,  Endo/Other  negative endocrine ROSdiabetes  Renal/GU negative Renal ROS  negative genitourinary   Musculoskeletal   Abdominal   Peds  Hematology negative hematology ROS (+)   Anesthesia Other Findings   Reproductive/Obstetrics negative OB ROS                             Anesthesia Physical Anesthesia Plan  ASA: II  Anesthesia Plan: MAC   Post-op Pain Management:    Induction:   PONV Risk Score and Plan:   Airway Management Planned:   Additional Equipment:   Intra-op Plan:   Post-operative Plan:   Informed Consent: I have reviewed the patients History and Physical, chart, labs and discussed the procedure including the risks, benefits and alternatives for the proposed anesthesia with the patient or authorized representative who has indicated his/her understanding and acceptance.     Dental Advisory Given  Plan Discussed with: CRNA  Anesthesia Plan Comments:         Anesthesia Quick Evaluation

## 2021-02-05 NOTE — Op Note (Signed)
Date of procedure: 02/05/21  Pre-operative diagnosis:  Visually significant combined form age-related cataract, Right Eye (H25.811)  Post-operative diagnosis:  Visually significant combined form age-related cataract, Right Eye (H25.811)  Procedure: Removal of cataract via phacoemulsification and insertion of intra-ocular lens Wynetta Emery and [employer] [id number]  +22.0D into the capsular bag of the Right Eye  Attending surgeon: Gerda Diss. Bahar Shelden, MD, MA  Anesthesia: MAC, Topical Akten  Complications: None  Estimated Blood Loss: <7m (minimal)  Specimens: None  Implants: As above  Indications:  Visually significant age-related cataract, Right Eye  Procedure:  The patient was seen and identified in the pre-operative area. The operative eye was identified and dilated.  The operative eye was marked.  Topical anesthesia was administered to the operative eye.     The patient was then to the operative suite and placed in the supine position.  A timeout was performed confirming the patient, procedure to be performed, and all other relevant information.   The patient's face was prepped and draped in the usual fashion for intra-ocular surgery.  A lid speculum was placed into the operative eye and the surgical microscope moved into place and focused.  A superotemporal paracentesis was created using a 20 gauge paracentesis blade.  Shugarcaine was injected into the anterior chamber.  Viscoelastic was injected into the anterior chamber.  A temporal clear-corneal main wound incision was created using a 2.418mmicrokeratome.  A continuous curvilinear capsulorrhexis was initiated using an irrigating cystitome and completed using capsulorrhexis forceps.  Hydrodissection and hydrodeliniation were performed.  Viscoelastic was injected into the anterior chamber.  A phacoemulsification handpiece and a chopper as a second instrument were used to remove the nucleus and epinucleus. The irrigation/aspiration handpiece was  used to remove any remaining cortical material.   The capsular bag was reinflated with viscoelastic, checked, and found to be intact.  The intraocular lens was inserted into the capsular bag.  The irrigation/aspiration handpiece was used to remove any remaining viscoelastic.  The clear corneal wound and paracentesis wounds were then hydrated and checked with Weck-Cels to be watertight.  The lid-speculum was removed.  The drape was removed.  The patient's face was cleaned with a wet and dry 4x4.   Maxitrol was instilled in the eye. A clear shield was taped over the eye. The patient was taken to the post-operative care unit in good condition, having tolerated the procedure well.  Post-Op Instructions: [facility] and will receive all other orders and instructions.

## 2021-02-05 NOTE — Interval H&P Note (Signed)
History and Physical Interval Note:  02/05/2021 10:37 AM  Brianna Manning  has presented today for surgery, with the diagnosis of Nuclear sclerotic cataract - Right eye.  The various methods of treatment have been discussed with the patient and family. After consideration of risks, benefits and other options for treatment, the patient has consented to  Procedure(s) with comments: CATARACT EXTRACTION PHACO AND INTRAOCULAR LENS PLACEMENT (IOC) (Right) - right, pt knows to arrive at 8:30 as a surgical intervention.  The patient's history has been reviewed, patient examined, no change in status, stable for surgery.  I have reviewed the patient's chart and labs.  Questions were answered to the patient's satisfaction.     [name]

## 2021-02-08 ENCOUNTER — Encounter (HOSPITAL_COMMUNITY): Payer: Self-pay | Admitting: Ophthalmology

## 2021-02-25 NOTE — H&P (Signed)
Surgical History & Physical  Patient Name: Brianna Manning DOB: 1946-09-07  Surgery: Cataract extraction with intraocular lens implant phacoemulsification; Left Eye  Surgeon: [name] MD Surgery Date:  03/08/2021 Pre-Op Date:  02/11/2021  HPI: A 88 Yr. old female patient PO OD/Pre op OS The patient is returning after cataract surgery. The right eye is affected. Status post cataract surgery, which began 1 week ago: Since the last visit, the affected area is doing well. The patient's vision is improved and stable. Patient is following medication instructions. Moxi TID, Pred TID OD. The patient experiences no increase in flashes, floater, shadow, curtain or veil. The patient complains of difficulty when viewing TV, reading closed caption, news scrolls on TV, which began 1 year ago. The left eye is affected. The episode is gradual. The condition's severity increased since last visit. Symptoms occur when the patient is inside, outside and reading. The complaint is associated with glare. This is negatively affecting her quality of life. HPI was performed by [name] .  Medical History: Glaucoma Cataracts Diabetes Hypercholesterolemia Lung Problems  Review of Systems Negative Allergic/Immunologic Negative Cardiovascular Negative Constitutional Negative Ear, Nose, Mouth & Throat Negative Endocrine Negative Eyes Negative Gastrointestinal Negative Genitourinary Negative Hemotologic/Lymphatic Negative Integumentary Negative Musculoskeletal Negative Neurological Negative Psychiatry Negative Respiratory  Social   Never smoked   Medication Ilevro, Moxifloxacin, Prednisolone acetate 1%,  Fish Oil, Flonase, Ventolin, Vitamin C, Vitamin D3, Aspirin, Atorvastatin, Losartan Potassium, Metoprolol, Omeprazole, Ibuprofen, Amitiza, Hydrochlorothiazide, Atorvastatin, Losartan Potassium, Albuterol,   Sx/Procedures Phaco c IOL OD,  Breast Biopsy Neg,   Drug Allergies   NKDA  History  & Physical: Heent:  Cataract, Left eye NECK: supple without bruits LUNGS: lungs clear to auscultation CV: regular rate and rhythm Abdomen: soft and non-tender  Impression & Plan: Assessment: 1.  CATARACT EXTRACTION STATUS; Right Eye (Z98.41) 2.  INTRAOCULAR LENS IOL (Z96.1) 3.  COMBINED FORMS AGE RELATED CATARACT; Left Eye (H25.812) 4.  BLEPHARITIS; Right Upper Lid, Right Lower Lid, Left Upper Lid, Left Lower Lid (H01.001, H01.002,H01.004,H01.005)  Plan: 1.  1 week after cataract surgery. Doing well with improved vision and normal eye pressure. Call with any problems or concerns. Stop Vigamox. Continue Ilevro 1 drop 1x/day for 3 more weeks. Continue Pred Acetate 1 drop 2x/day for 3 more weeks.  2.  Doing well since surgery Continue Post-op medications  3.  Dilates well - shugarcaine by protocol. Cataract accounts for the patient's decreased vision. This visual impairment is not correctable with a tolerable change in glasses or contact lenses. Cataract surgery with an implantation of a new lens should significantly improve the visual and functional status of the patient. Discussed all risks, benefits, alternatives, and potential complications. Discussed the procedures and recovery. Patient desires to have surgery. A-scan ordered and performed today for intra-ocular lens calculations. The surgery will be performed in order to improve vision for driving, reading, and for eye examinations. Recommend phacoemulsification with intra-ocular lens. Recommend Dextenza for post-operative pain and inflammation. Left Eye. Surgery required to correct imbalance of vision.  4.  Regular lid cleaning.

## 2021-02-26 ENCOUNTER — Other Ambulatory Visit: Payer: Self-pay | Admitting: Cardiology

## 2021-03-01 ENCOUNTER — Encounter (HOSPITAL_COMMUNITY)
Admission: RE | Admit: 2021-03-01 | Discharge: 2021-03-01 | Disposition: A | Discharge status: Home / Self Care | Payer: Medicare Other | Source: Ambulatory Visit | Attending: Ophthalmology | Admitting: Ophthalmology

## 2021-03-01 ENCOUNTER — Other Ambulatory Visit: Payer: Self-pay

## 2021-03-01 DIAGNOSIS — H25812 Combined forms of age-related cataract, left eye: Secondary | ICD-10-CM | POA: Diagnosis not present

## 2021-03-05 ENCOUNTER — Other Ambulatory Visit (HOSPITAL_COMMUNITY)
Admission: RE | Admit: 2021-03-05 | Discharge: 2021-03-05 | Disposition: A | Discharge status: Home / Self Care | Payer: Medicare Other | Source: Ambulatory Visit | Attending: Ophthalmology | Admitting: Ophthalmology

## 2021-03-05 ENCOUNTER — Other Ambulatory Visit: Payer: Self-pay

## 2021-03-05 DIAGNOSIS — Z01812 Encounter for preprocedural laboratory examination: Secondary | ICD-10-CM | POA: Diagnosis not present

## 2021-03-05 DIAGNOSIS — Z20822 Contact with and (suspected) exposure to covid-19: Secondary | ICD-10-CM | POA: Diagnosis not present

## 2021-03-05 LAB — SARS CORONAVIRUS 2 (TAT 6-24 HRS): SARS Coronavirus 2: NEGATIVE

## 2021-03-08 ENCOUNTER — Ambulatory Visit (HOSPITAL_COMMUNITY): Payer: Medicare Other | Admitting: Anesthesiology

## 2021-03-08 ENCOUNTER — Encounter (HOSPITAL_COMMUNITY)
Admission: RE | Disposition: A | Discharge status: Home / Self Care | Payer: Self-pay | Source: Ambulatory Visit | Attending: Ophthalmology

## 2021-03-08 ENCOUNTER — Encounter (HOSPITAL_COMMUNITY): Payer: Self-pay | Admitting: Ophthalmology

## 2021-03-08 ENCOUNTER — Other Ambulatory Visit: Payer: Self-pay

## 2021-03-08 ENCOUNTER — Ambulatory Visit (HOSPITAL_COMMUNITY)
Admission: RE | Admit: 2021-03-08 | Discharge: 2021-03-08 | Disposition: A | Discharge status: Home / Self Care | Payer: Medicare Other | Source: Ambulatory Visit | Attending: Ophthalmology | Admitting: Ophthalmology

## 2021-03-08 DIAGNOSIS — E7800 Pure hypercholesterolemia, unspecified: Secondary | ICD-10-CM | POA: Insufficient documentation

## 2021-03-08 DIAGNOSIS — H0100A Unspecified blepharitis right eye, upper and lower eyelids: Secondary | ICD-10-CM | POA: Insufficient documentation

## 2021-03-08 DIAGNOSIS — K219 Gastro-esophageal reflux disease without esophagitis: Secondary | ICD-10-CM | POA: Diagnosis not present

## 2021-03-08 DIAGNOSIS — H0100B Unspecified blepharitis left eye, upper and lower eyelids: Secondary | ICD-10-CM | POA: Diagnosis not present

## 2021-03-08 DIAGNOSIS — E1136 Type 2 diabetes mellitus with diabetic cataract: Secondary | ICD-10-CM | POA: Diagnosis not present

## 2021-03-08 DIAGNOSIS — H25812 Combined forms of age-related cataract, left eye: Secondary | ICD-10-CM | POA: Diagnosis not present

## 2021-03-08 DIAGNOSIS — H409 Unspecified glaucoma: Secondary | ICD-10-CM | POA: Diagnosis not present

## 2021-03-08 DIAGNOSIS — Z9841 Cataract extraction status, right eye: Secondary | ICD-10-CM | POA: Insufficient documentation

## 2021-03-08 HISTORY — PX: CATARACT EXTRACTION W/PHACO: SHX586

## 2021-03-08 LAB — GLUCOSE, CAPILLARY: Glucose-Capillary: 102 mg/dL — ABNORMAL HIGH (ref 70–99)

## 2021-03-08 SURGERY — PHACOEMULSIFICATION, CATARACT, WITH IOL INSERTION
Anesthesia: Monitor Anesthesia Care | Site: Eye | Laterality: Left

## 2021-03-08 MED ORDER — EPINEPHRINE PF 1 MG/ML IJ SOLN
INTRAOCULAR | Status: DC | PRN
  Administered 2021-03-08: 500 mL

## 2021-03-08 MED ORDER — MIDAZOLAM HCL 5 MG/5ML IJ SOLN
INTRAMUSCULAR | Status: DC | PRN
  Administered 2021-03-08: 2 mg via INTRAVENOUS

## 2021-03-08 MED ORDER — MIDAZOLAM HCL 2 MG/2ML IJ SOLN
INTRAMUSCULAR | Status: AC
  Filled 2021-03-08: qty 2

## 2021-03-08 MED ORDER — TROPICAMIDE 1 % OP SOLN
1.0000 [drp] | OPHTHALMIC | Status: AC
  Administered 2021-03-08 (×3): 1 [drp] via OPHTHALMIC

## 2021-03-08 MED ORDER — NEOMYCIN-POLYMYXIN-DEXAMETH 3.5-10000-0.1 OP SUSP
OPHTHALMIC | Status: DC | PRN
  Administered 2021-03-08: 1 [drp] via OPHTHALMIC

## 2021-03-08 MED ORDER — LIDOCAINE HCL (PF) 1 % IJ SOLN
INTRAOCULAR | Status: DC | PRN
  Administered 2021-03-08: 1 mL via OPHTHALMIC

## 2021-03-08 MED ORDER — IPRATROPIUM-ALBUTEROL 0.5-2.5 (3) MG/3ML IN SOLN
RESPIRATORY_TRACT | Status: AC
  Filled 2021-03-08: qty 3

## 2021-03-08 MED ORDER — POVIDONE-IODINE 5 % OP SOLN
OPHTHALMIC | Status: DC | PRN
  Administered 2021-03-08: 1 via OPHTHALMIC

## 2021-03-08 MED ORDER — PHENYLEPHRINE HCL 2.5 % OP SOLN
1.0000 [drp] | OPHTHALMIC | Status: AC | PRN
  Administered 2021-03-08 (×3): 1 [drp] via OPHTHALMIC

## 2021-03-08 MED ORDER — EPINEPHRINE PF 1 MG/ML IJ SOLN
INTRAMUSCULAR | Status: AC
  Filled 2021-03-08: qty 2

## 2021-03-08 MED ORDER — IPRATROPIUM-ALBUTEROL 0.5-2.5 (3) MG/3ML IN SOLN
3.0000 mL | RESPIRATORY_TRACT | Status: DC
  Administered 2021-03-08: 3 mL via RESPIRATORY_TRACT

## 2021-03-08 MED ORDER — STERILE WATER FOR IRRIGATION IR SOLN
Status: DC | PRN
  Administered 2021-03-08: 250 mL

## 2021-03-08 MED ORDER — PROVISC 10 MG/ML IO SOLN
INTRAOCULAR | Status: DC | PRN
  Administered 2021-03-08: 0.85 mL via INTRAOCULAR

## 2021-03-08 MED ORDER — TETRACAINE HCL 0.5 % OP SOLN
1.0000 [drp] | OPHTHALMIC | Status: AC | PRN
  Administered 2021-03-08 (×3): 1 [drp] via OPHTHALMIC

## 2021-03-08 MED ORDER — LIDOCAINE HCL 3.5 % OP GEL
1.0000 "application " | Freq: Once | OPHTHALMIC | Status: AC
  Administered 2021-03-08: 1 via OPHTHALMIC

## 2021-03-08 MED ORDER — TROPICAMIDE 1 % OP SOLN
3.0000 [drp] | OPHTHALMIC | Status: AC
  Administered 2021-03-08 (×2): 3 [drp] via OPHTHALMIC

## 2021-03-08 MED ORDER — CYCLOPENTOLATE-PHENYLEPHRINE 0.2-1 % OP SOLN
1.0000 [drp] | OPHTHALMIC | Status: DC | PRN
  Filled 2021-03-08: qty 2

## 2021-03-08 MED ORDER — SODIUM CHLORIDE 0.9% FLUSH
INTRAVENOUS | Status: DC | PRN
  Administered 2021-03-08: 8 mL via INTRAVENOUS

## 2021-03-08 MED ORDER — SODIUM HYALURONATE 23 MG/ML IO SOLN
INTRAOCULAR | Status: DC | PRN
  Administered 2021-03-08: 0.6 mL via INTRAOCULAR

## 2021-03-08 MED ORDER — BSS IO SOLN
INTRAOCULAR | Status: DC | PRN
  Administered 2021-03-08: 15 mL

## 2021-03-08 SURGICAL SUPPLY — 12 items
CLOTH BEACON ORANGE TIMEOUT ST (SAFETY) ×2 IMPLANT
EYE SHIELD UNIVERSAL CLEAR (GAUZE/BANDAGES/DRESSINGS) ×2 IMPLANT
GLOVE SURG UNDER POLY LF SZ6.5 (GLOVE) ×2 IMPLANT
GLOVE SURG UNDER POLY LF SZ7 (GLOVE) ×2 IMPLANT
NEEDLE HYPO 18GX1.5 BLUNT FILL (NEEDLE) ×2 IMPLANT
PAD ARMBOARD 7.5X6 YLW CONV (MISCELLANEOUS) ×2 IMPLANT
SYR TB 1ML LL NO SAFETY (SYRINGE) ×2 IMPLANT
TAPE SURG TRANSPORE 1 IN (GAUZE/BANDAGES/DRESSINGS) ×1 IMPLANT
TAPE SURGICAL TRANSPORE 1 IN (GAUZE/BANDAGES/DRESSINGS) ×1
TECNIS 1 PIECE INTRAOCULAR LENS (Intraocular Lens) ×2 IMPLANT
VISCOELASTIC ADDITIONAL (OPHTHALMIC RELATED) IMPLANT
WATER STERILE IRR 250ML POUR (IV SOLUTION) ×2 IMPLANT

## 2021-03-08 NOTE — Interval H&P Note (Signed)
History and Physical Interval Note:  03/08/2021 11:12 AM  Brianna Manning  has presented today for surgery, with the diagnosis of Nuclear sclerotic cataract - Left eye.  The various methods of treatment have been discussed with the patient and family. After consideration of risks, benefits and other options for treatment, the patient has consented to  Procedure(s) with comments: CATARACT EXTRACTION PHACO AND INTRAOCULAR LENS PLACEMENT (IOC) (Left) - left as a surgical intervention.  The patient's history has been reviewed, patient examined, no change in status, stable for surgery.  I have reviewed the patient's chart and labs.  Questions were answered to the patient's satisfaction.     [name]

## 2021-03-08 NOTE — Transfer of Care (Signed)
Immediate Anesthesia Transfer of Care Note  Patient: Brianna Manning  Procedure(s) Performed: CATARACT EXTRACTION PHACO AND INTRAOCULAR LENS PLACEMENT LEFT EYE (Left Eye)  Patient Location: Short Stay  Anesthesia Type:MAC  Level of Consciousness: awake  Airway & Oxygen Therapy: Patient Spontanous Breathing  Post-op Assessment: Report given to RN  Post vital signs: Reviewed and stable  Last Vitals:  Vitals Value Taken Time  BP 142/60 03/08/21 1141  Temp 36.6 C 03/08/21 1141  Pulse 56 03/08/21 1141  Resp 16 03/08/21 1141  SpO2 98 % 03/08/21 1141    Last Pain:  Vitals:   03/08/21 1141  TempSrc: Oral  PainSc: 0-No pain      Patients Stated Pain Goal: 5 (38/88/28 0034)  Complications: No complications documented.

## 2021-03-08 NOTE — Anesthesia Postprocedure Evaluation (Signed)
Anesthesia Post Note  Patient: Brianna Manning  Procedure(s) Performed: CATARACT EXTRACTION PHACO AND INTRAOCULAR LENS PLACEMENT LEFT EYE (Left Eye)  Patient location during evaluation: Short Stay Anesthesia Type: MAC Level of consciousness: awake and alert Pain management: pain level controlled Vital Signs Assessment: post-procedure vital signs reviewed and stable Respiratory status: spontaneous breathing Cardiovascular status: blood pressure returned to baseline Postop Assessment: no apparent nausea or vomiting Anesthetic complications: no   No complications documented.   Last Vitals:  Vitals:   03/08/21 1115 03/08/21 1141  BP: (!) 147/63 (!) 142/60  Pulse:  (!) 56  Resp:  16  Temp:  36.6 C  SpO2:  98%    Last Pain:  Vitals:   03/08/21 1141  TempSrc: Oral  PainSc: 0-No pain                 Dyamond Tolosa

## 2021-03-08 NOTE — Op Note (Signed)
Date of procedure: 03/08/21  Pre-operative diagnosis: Visually significant age-related combined cataract, Left Eye (H25.812)  Post-operative diagnosis: Visually significant age-related combined cataract, Left Eye (H25.812)  Procedure: Removal of cataract via phacoemulsification and insertion of intra-ocular lens Johnson [employer] Finger  +22.0D into the capsular bag of the Left Eye  Attending surgeon: Gerda Diss. Ciclaly Mulcahey, MD, MA  Anesthesia: MAC, Topical Akten  Complications: None  Estimated Blood Loss: <70m (minimal)  Specimens: None  Implants: As above  Indications:  Visually significant age-related cataract, Left Eye  Procedure:  The patient was seen and identified in the pre-operative area. The operative eye was identified and dilated.  The operative eye was marked.  Topical anesthesia was administered to the operative eye.     The patient was then to the operative suite and placed in the supine position.  A timeout was performed confirming the patient, procedure to be performed, and all other relevant information.   The patient's face was prepped and draped in the usual fashion for intra-ocular surgery.  A lid speculum was placed into the operative eye and the surgical microscope moved into place and focused.  An inferotemporal paracentesis was created using a 20 gauge paracentesis blade.  Shugarcaine was injected into the anterior chamber.  Viscoelastic was injected into the anterior chamber.  A temporal clear-corneal main wound incision was created using a 2.496mmicrokeratome.  A continuous curvilinear capsulorrhexis was initiated using an irrigating cystitome and completed using capsulorrhexis forceps.  Hydrodissection and hydrodeliniation were performed.  Viscoelastic was injected into the anterior chamber.  A phacoemulsification handpiece and a chopper as a second instrument were used to remove the nucleus and epinucleus. The irrigation/aspiration handpiece was used to remove  any remaining cortical material.   The capsular bag was reinflated with viscoelastic, checked, and found to be intact.  The intraocular lens was inserted into the capsular bag.  The irrigation/aspiration handpiece was used to remove any remaining viscoelastic.  The clear corneal wound and paracentesis wounds were then hydrated and checked with Weck-Cels to be watertight.  The lid-speculum was removed.  The drape was removed.  The patient's face was cleaned with a wet and dry 4x4.   Maxitrol was instilled in the eye. A clear shield was taped over the eye. The patient was taken to the post-operative care unit in good condition, having tolerated the procedure well.  Post-Op Instructions: [facility] and will receive all other orders and instructions.

## 2021-03-08 NOTE — Discharge Instructions (Addendum)
Please discharge patient when stable, will follow up today with Dr. Wrzosek at the [facility] office immediately following discharge.  Leave shield in place until visit.  All paperwork with discharge instructions will be given at the office. ° ° [facility] Address: ° °[address]  °[place], [place] [address] ° ° °Monitored Anesthesia Care, Care After °This sheet gives you information about how to care for yourself after your procedure. Your health care provider may also give you more specific instructions. If you have problems or questions, contact your health care provider. °What can I expect after the procedure? °After the procedure, it is common to have: °· Tiredness. °· Forgetfulness about what happened after the procedure. °· Impaired judgment for important decisions. °· Nausea or vomiting. °· Some difficulty with balance. °Follow these instructions at home: °For the time period you were told by your health care provider: °· Rest as needed. °· Do not participate in activities where you could fall or become injured. °· Do not drive or use machinery. °· Do not drink alcohol. °· Do not take sleeping pills or medicines that cause drowsiness. °· Do not make important decisions or sign legal documents. °· Do not take care of children on your own.  °  °  °Eating and drinking °· Follow the diet that is recommended by your health care provider. °· Drink enough fluid to keep your urine pale yellow. °· If you vomit: °? Drink water, juice, or soup when you can drink without vomiting. °? Make sure you have little or no nausea before eating solid foods. °General instructions °· Have a responsible adult stay with you for the time you are told. It is important to have someone help care for you until you are awake and alert. °· Take over-the-counter and prescription medicines only as told by your health care provider. °· If you have sleep apnea, surgery and certain medicines can increase  your risk for breathing problems. Follow instructions from your health care provider about wearing your sleep device: °? Anytime you are sleeping, including during daytime naps. °? While taking prescription pain medicines, sleeping medicines, or medicines that make you drowsy. °· Avoid smoking. °· Keep all follow-up visits as told by your health care provider. This is important. °Contact a health care provider if: °· You keep feeling nauseous or you keep vomiting. °· You feel light-headed. °· You are still sleepy or having trouble with balance after 24 hours. °· You develop a rash. °· You have a fever. °· You have redness or swelling around the IV site. °Get help right away if: °· You have trouble breathing. °· You have new-onset confusion at home. °Summary °· For several hours after your procedure, you may feel tired. You may also be forgetful and have poor judgment. °· Have a responsible adult stay with you for the time you are told. It is important to have someone help care for you until you are awake and alert. °· Rest as told. Do not drive or operate machinery. Do not drink alcohol or take sleeping pills. °· Get help right away if you have trouble breathing, or if you suddenly become confused. °This information is not intended to replace advice given to you by your health care provider. Make sure you discuss any questions you have with your health care provider. °Document Revised: 08/13/2020 Document Reviewed: 10/31/2019 °Elsevier Patient Education © 2021 [employer] Inc[employer] ° ° ° ° ° ° ° ° ° ° ° ° ° ° ° °  Monitored Anesthesia Care, Care After °This sheet gives you information about how to care for yourself after your procedure. Your health care provider may also give you more specific instructions. If you have problems or questions, contact your health care provider. °What can I expect after the procedure? °After the procedure, it is common to have: °· Tiredness. °· Forgetfulness about what happened after the  procedure. °· Impaired judgment for important decisions. °· Nausea or vomiting. °· Some difficulty with balance. °Follow these instructions at home: °For the time period you were told by your health care provider: °· Rest as needed. °· Do not participate in activities where you could fall or become injured. °· Do not drive or use machinery. °· Do not drink alcohol. °· Do not take sleeping pills or medicines that cause drowsiness. °· Do not make important decisions or sign legal documents. °· Do not take care of children on your own.  °  °  °Eating and drinking °· Follow the diet that is recommended by your health care provider. °· Drink enough fluid to keep your urine pale yellow. °· If you vomit: °? Drink water, juice, or soup when you can drink without vomiting. °? Make sure you have little or no nausea before eating solid foods. °General instructions °· Have a responsible adult stay with you for the time you are told. It is important to have someone help care for you until you are awake and alert. °· Take over-the-counter and prescription medicines only as told by your health care provider. °· If you have sleep apnea, surgery and certain medicines can increase your risk for breathing problems. Follow instructions from your health care provider about wearing your sleep device: °? Anytime you are sleeping, including during daytime naps. °? While taking prescription pain medicines, sleeping medicines, or medicines that make you drowsy. °· Avoid smoking. °· Keep all follow-up visits as told by your health care provider. This is important. °Contact a health care provider if: °· You keep feeling nauseous or you keep vomiting. °· You feel light-headed. °· You are still sleepy or having trouble with balance after 24 hours. °· You develop a rash. °· You have a fever. °· You have redness or swelling around the IV site. °Get help right away if: °· You have trouble breathing. °· You have new-onset confusion at  home. °Summary °· For several hours after your procedure, you may feel tired. You may also be forgetful and have poor judgment. °· Have a responsible adult stay with you for the time you are told. It is important to have someone help care for you until you are awake and alert. °· Rest as told. Do not drive or operate machinery. Do not drink alcohol or take sleeping pills. °· Get help right away if you have trouble breathing, or if you suddenly become confused. °This information is not intended to replace advice given to you by your health care provider. Make sure you discuss any questions you have with your health care provider. °Document Revised: 08/13/2020 Document Reviewed: 10/31/2019 °Elsevier Patient Education © 2021 Elsevier Inc. ° °

## 2021-03-08 NOTE — Anesthesia Preprocedure Evaluation (Signed)
Anesthesia Evaluation  Patient identified by MRN, date of birth, ID band Patient awake    Reviewed: Allergy & Precautions, NPO status , Patient's Chart, lab work & pertinent test results, reviewed documented beta blocker date and time   History of Anesthesia Complications Negative for: history of anesthetic complications  Airway Mallampati: II  TM Distance: >3 FB Neck ROM: Full    Dental  (+) Dental Advisory Given, Missing   Pulmonary shortness of breath and with exertion, asthma , neg COPD,  COPD inhaler,     (-) decreased breath sounds+ wheezing      Cardiovascular Exercise Tolerance: Good hypertension, Pt. on home beta blockers and Pt. on medications Normal cardiovascular exam Rhythm:Regular Rate:Normal     Neuro/Psych  Headaches, negative psych ROS   GI/Hepatic Neg liver ROS, GERD  Medicated,  Endo/Other  diabetes, Well Controlled, Type 2, Oral Hypoglycemic AgentsMorbid obesity  Renal/GU negative Renal ROS     Musculoskeletal  (+) Arthritis  (chronic back pain),   Abdominal   Peds  Hematology   Anesthesia Other Findings   Reproductive/Obstetrics                           Anesthesia Physical Anesthesia Plan  ASA: II  Anesthesia Plan: MAC   Post-op Pain Management:    Induction:   PONV Risk Score and Plan:   Airway Management Planned: Nasal Cannula and Natural Airway  Additional Equipment:   Intra-op Plan:   Post-operative Plan:   Informed Consent: I have reviewed the patients History and Physical, chart, labs and discussed the procedure including the risks, benefits and alternatives for the proposed anesthesia with the patient or authorized representative who has indicated his/her understanding and acceptance.     Dental advisory given  Plan Discussed with: CRNA and Surgeon  Anesthesia Plan Comments: (Mild wheezing, will give duoneb treatment before the procedure)        Anesthesia Quick Evaluation

## 2021-03-09 ENCOUNTER — Encounter (HOSPITAL_COMMUNITY): Payer: Self-pay | Admitting: Ophthalmology

## 2021-03-10 ENCOUNTER — Ambulatory Visit (HOSPITAL_COMMUNITY)
Admission: RE | Admit: 2021-03-10 | Discharge: 2021-03-10 | Disposition: A | Discharge status: Home / Self Care | Payer: Medicare Other | Source: Ambulatory Visit | Attending: Gerontology | Admitting: Gerontology

## 2021-03-10 ENCOUNTER — Other Ambulatory Visit: Payer: Self-pay

## 2021-03-10 ENCOUNTER — Other Ambulatory Visit (HOSPITAL_COMMUNITY): Payer: Self-pay | Admitting: Gerontology

## 2021-03-10 ENCOUNTER — Other Ambulatory Visit (HOSPITAL_COMMUNITY)
Admission: RE | Admit: 2021-03-10 | Discharge: 2021-03-10 | Disposition: A | Discharge status: Home / Self Care | Payer: Medicare Other | Source: Ambulatory Visit | Attending: Internal Medicine | Admitting: Internal Medicine

## 2021-03-10 DIAGNOSIS — R0602 Shortness of breath: Secondary | ICD-10-CM | POA: Diagnosis not present

## 2021-03-10 DIAGNOSIS — E1165 Type 2 diabetes mellitus with hyperglycemia: Secondary | ICD-10-CM | POA: Insufficient documentation

## 2021-03-10 DIAGNOSIS — Z0001 Encounter for general adult medical examination with abnormal findings: Secondary | ICD-10-CM | POA: Insufficient documentation

## 2021-03-10 DIAGNOSIS — N1831 Chronic kidney disease, stage 3a: Secondary | ICD-10-CM | POA: Diagnosis not present

## 2021-03-10 DIAGNOSIS — I10 Essential (primary) hypertension: Secondary | ICD-10-CM | POA: Insufficient documentation

## 2021-03-10 DIAGNOSIS — R059 Cough, unspecified: Secondary | ICD-10-CM

## 2021-03-10 DIAGNOSIS — Z1389 Encounter for screening for other disorder: Secondary | ICD-10-CM | POA: Diagnosis not present

## 2021-03-10 LAB — CBC WITH DIFFERENTIAL/PLATELET
Abs Immature Granulocytes: 0.03 10*3/uL (ref 0.00–0.07)
Basophils Absolute: 0.1 10*3/uL (ref 0.0–0.1)
Basophils Relative: 1 %
Eosinophils Absolute: 0.3 10*3/uL (ref 0.0–0.5)
Eosinophils Relative: 3 %
HCT: 38.2 % (ref 36.0–46.0)
Hemoglobin: 12.2 g/dL (ref 12.0–15.0)
Immature Granulocytes: 0 %
Lymphocytes Relative: 26 %
Lymphs Abs: 2.4 10*3/uL (ref 0.7–4.0)
MCH: 32.1 pg (ref 26.0–34.0)
MCHC: 31.9 g/dL (ref 30.0–36.0)
MCV: 100.5 fL — ABNORMAL HIGH (ref 80.0–100.0)
Monocytes Absolute: 0.7 10*3/uL (ref 0.1–1.0)
Monocytes Relative: 8 %
Neutro Abs: 5.6 10*3/uL (ref 1.7–7.7)
Neutrophils Relative %: 62 %
Platelets: 204 10*3/uL (ref 150–400)
RBC: 3.8 MIL/uL — ABNORMAL LOW (ref 3.87–5.11)
RDW: 13.3 % (ref 11.5–15.5)
WBC: 9.1 10*3/uL (ref 4.0–10.5)
nRBC: 0 % (ref 0.0–0.2)

## 2021-03-10 LAB — BASIC METABOLIC PANEL
Anion gap: 10 (ref 5–15)
BUN: 12 mg/dL (ref 8–23)
CO2: 24 mmol/L (ref 22–32)
Calcium: 9.3 mg/dL (ref 8.9–10.3)
Chloride: 103 mmol/L (ref 98–111)
Creatinine, Ser: 1.13 mg/dL — ABNORMAL HIGH (ref 0.44–1.00)
GFR, Estimated: 51 mL/min — ABNORMAL LOW (ref >60)
Glucose, Bld: 110 mg/dL — ABNORMAL HIGH (ref 70–99)
Potassium: 4.1 mmol/L (ref 3.5–5.1)
Sodium: 137 mmol/L (ref 135–145)

## 2021-03-10 LAB — HEPATIC FUNCTION PANEL
ALT: 16 U/L (ref 0–44)
AST: 20 U/L (ref 15–41)
Albumin: 4.2 g/dL (ref 3.5–5.0)
Alkaline Phosphatase: 77 U/L (ref 38–126)
Bilirubin, Direct: 0.1 mg/dL (ref 0.0–0.2)
Indirect Bilirubin: 0.5 mg/dL (ref 0.3–0.9)
Total Bilirubin: 0.6 mg/dL (ref 0.3–1.2)
Total Protein: 7.2 g/dL (ref 6.5–8.1)

## 2021-03-10 LAB — HEMOGLOBIN A1C
Hgb A1c MFr Bld: 6.5 % — ABNORMAL HIGH (ref 4.8–5.6)
Mean Plasma Glucose: 139.85 mg/dL

## 2021-03-10 LAB — LIPID PANEL
Cholesterol: 126 mg/dL (ref 0–200)
HDL: 49 mg/dL (ref >40)
LDL Cholesterol: 59 mg/dL (ref 0–99)
Total CHOL/HDL Ratio: 2.6 RATIO
Triglycerides: 90 mg/dL (ref <150)
VLDL: 18 mg/dL (ref 0–40)

## 2021-03-11 LAB — MICROALBUMIN / CREATININE URINE RATIO
Creatinine, Urine: 84.3 mg/dL
Microalb Creat Ratio: 4 mg/g creat (ref 0–29)
Microalb, Ur: 3.2 ug/mL — ABNORMAL HIGH

## 2021-03-26 DIAGNOSIS — J4530 Mild persistent asthma, uncomplicated: Secondary | ICD-10-CM | POA: Diagnosis not present

## 2021-04-10 DIAGNOSIS — I10 Essential (primary) hypertension: Secondary | ICD-10-CM | POA: Diagnosis not present

## 2021-04-10 DIAGNOSIS — E1165 Type 2 diabetes mellitus with hyperglycemia: Secondary | ICD-10-CM | POA: Diagnosis not present

## 2021-04-16 DIAGNOSIS — E119 Type 2 diabetes mellitus without complications: Secondary | ICD-10-CM | POA: Diagnosis not present

## 2021-04-25 DIAGNOSIS — J4530 Mild persistent asthma, uncomplicated: Secondary | ICD-10-CM | POA: Diagnosis not present

## 2021-04-27 DIAGNOSIS — M79674 Pain in right toe(s): Secondary | ICD-10-CM | POA: Diagnosis not present

## 2021-04-27 DIAGNOSIS — M79675 Pain in left toe(s): Secondary | ICD-10-CM | POA: Diagnosis not present

## 2021-04-27 DIAGNOSIS — E1140 Type 2 diabetes mellitus with diabetic neuropathy, unspecified: Secondary | ICD-10-CM | POA: Diagnosis not present

## 2021-04-27 DIAGNOSIS — I739 Peripheral vascular disease, unspecified: Secondary | ICD-10-CM | POA: Diagnosis not present

## 2021-04-27 DIAGNOSIS — L110 Acquired keratosis follicularis: Secondary | ICD-10-CM | POA: Diagnosis not present

## 2021-04-27 DIAGNOSIS — M79671 Pain in right foot: Secondary | ICD-10-CM | POA: Diagnosis not present

## 2021-04-27 DIAGNOSIS — M79672 Pain in left foot: Secondary | ICD-10-CM | POA: Diagnosis not present

## 2021-05-10 DIAGNOSIS — E1165 Type 2 diabetes mellitus with hyperglycemia: Secondary | ICD-10-CM | POA: Diagnosis not present

## 2021-05-10 DIAGNOSIS — I10 Essential (primary) hypertension: Secondary | ICD-10-CM | POA: Diagnosis not present

## 2021-05-26 DIAGNOSIS — J4530 Mild persistent asthma, uncomplicated: Secondary | ICD-10-CM | POA: Diagnosis not present

## 2021-06-10 DIAGNOSIS — I10 Essential (primary) hypertension: Secondary | ICD-10-CM | POA: Diagnosis not present

## 2021-06-10 DIAGNOSIS — E1165 Type 2 diabetes mellitus with hyperglycemia: Secondary | ICD-10-CM | POA: Diagnosis not present

## 2021-06-25 DIAGNOSIS — J4530 Mild persistent asthma, uncomplicated: Secondary | ICD-10-CM | POA: Diagnosis not present

## 2021-07-10 DIAGNOSIS — N183 Chronic kidney disease, stage 3 unspecified: Secondary | ICD-10-CM | POA: Diagnosis not present

## 2021-07-10 DIAGNOSIS — E1165 Type 2 diabetes mellitus with hyperglycemia: Secondary | ICD-10-CM | POA: Diagnosis not present

## 2021-07-12 DIAGNOSIS — H903 Sensorineural hearing loss, bilateral: Secondary | ICD-10-CM | POA: Diagnosis not present

## 2021-07-12 DIAGNOSIS — H608X3 Other otitis externa, bilateral: Secondary | ICD-10-CM | POA: Diagnosis not present

## 2021-07-20 DIAGNOSIS — E1140 Type 2 diabetes mellitus with diabetic neuropathy, unspecified: Secondary | ICD-10-CM | POA: Diagnosis not present

## 2021-07-20 DIAGNOSIS — I739 Peripheral vascular disease, unspecified: Secondary | ICD-10-CM | POA: Diagnosis not present

## 2021-07-20 DIAGNOSIS — M79674 Pain in right toe(s): Secondary | ICD-10-CM | POA: Diagnosis not present

## 2021-07-20 DIAGNOSIS — M79675 Pain in left toe(s): Secondary | ICD-10-CM | POA: Diagnosis not present

## 2021-07-20 DIAGNOSIS — M79671 Pain in right foot: Secondary | ICD-10-CM | POA: Diagnosis not present

## 2021-07-20 DIAGNOSIS — M79672 Pain in left foot: Secondary | ICD-10-CM | POA: Diagnosis not present

## 2021-07-20 DIAGNOSIS — L110 Acquired keratosis follicularis: Secondary | ICD-10-CM | POA: Diagnosis not present

## 2021-07-26 DIAGNOSIS — J4530 Mild persistent asthma, uncomplicated: Secondary | ICD-10-CM | POA: Diagnosis not present

## 2021-08-09 ENCOUNTER — Other Ambulatory Visit (HOSPITAL_COMMUNITY): Payer: Self-pay | Admitting: Internal Medicine

## 2021-08-09 DIAGNOSIS — Z1231 Encounter for screening mammogram for malignant neoplasm of breast: Secondary | ICD-10-CM

## 2021-08-10 DIAGNOSIS — I10 Essential (primary) hypertension: Secondary | ICD-10-CM | POA: Diagnosis not present

## 2021-08-10 DIAGNOSIS — E1165 Type 2 diabetes mellitus with hyperglycemia: Secondary | ICD-10-CM | POA: Diagnosis not present

## 2021-08-12 DIAGNOSIS — I10 Essential (primary) hypertension: Secondary | ICD-10-CM | POA: Diagnosis not present

## 2021-08-12 DIAGNOSIS — N1832 Chronic kidney disease, stage 3b: Secondary | ICD-10-CM | POA: Diagnosis not present

## 2021-08-12 DIAGNOSIS — E1165 Type 2 diabetes mellitus with hyperglycemia: Secondary | ICD-10-CM | POA: Diagnosis not present

## 2021-08-12 DIAGNOSIS — J4550 Severe persistent asthma, uncomplicated: Secondary | ICD-10-CM | POA: Diagnosis not present

## 2021-08-12 DIAGNOSIS — N390 Urinary tract infection, site not specified: Secondary | ICD-10-CM | POA: Diagnosis not present

## 2021-08-26 DIAGNOSIS — J4530 Mild persistent asthma, uncomplicated: Secondary | ICD-10-CM | POA: Diagnosis not present

## 2021-09-13 DIAGNOSIS — E1165 Type 2 diabetes mellitus with hyperglycemia: Secondary | ICD-10-CM | POA: Diagnosis not present

## 2021-09-13 DIAGNOSIS — H9203 Otalgia, bilateral: Secondary | ICD-10-CM | POA: Diagnosis not present

## 2021-09-13 DIAGNOSIS — Z23 Encounter for immunization: Secondary | ICD-10-CM | POA: Diagnosis not present

## 2021-09-13 DIAGNOSIS — I10 Essential (primary) hypertension: Secondary | ICD-10-CM | POA: Diagnosis not present

## 2021-09-13 DIAGNOSIS — J4550 Severe persistent asthma, uncomplicated: Secondary | ICD-10-CM | POA: Diagnosis not present

## 2021-09-13 DIAGNOSIS — H6693 Otitis media, unspecified, bilateral: Secondary | ICD-10-CM | POA: Diagnosis not present

## 2021-09-25 DIAGNOSIS — J4530 Mild persistent asthma, uncomplicated: Secondary | ICD-10-CM | POA: Diagnosis not present

## 2021-09-30 ENCOUNTER — Encounter: Payer: Self-pay | Admitting: *Deleted

## 2021-10-11 ENCOUNTER — Other Ambulatory Visit: Payer: Self-pay

## 2021-10-11 ENCOUNTER — Ambulatory Visit (HOSPITAL_COMMUNITY)
Admission: RE | Admit: 2021-10-11 | Discharge: 2021-10-11 | Disposition: A | Discharge status: Home / Self Care | Payer: Medicare Other | Source: Ambulatory Visit | Attending: Internal Medicine | Admitting: Internal Medicine

## 2021-10-11 DIAGNOSIS — Z1231 Encounter for screening mammogram for malignant neoplasm of breast: Secondary | ICD-10-CM | POA: Insufficient documentation

## 2021-10-14 DIAGNOSIS — E1165 Type 2 diabetes mellitus with hyperglycemia: Secondary | ICD-10-CM | POA: Diagnosis not present

## 2021-10-14 DIAGNOSIS — I10 Essential (primary) hypertension: Secondary | ICD-10-CM | POA: Diagnosis not present

## 2021-10-26 DIAGNOSIS — J4530 Mild persistent asthma, uncomplicated: Secondary | ICD-10-CM | POA: Diagnosis not present

## 2021-11-13 DIAGNOSIS — E1165 Type 2 diabetes mellitus with hyperglycemia: Secondary | ICD-10-CM | POA: Diagnosis not present

## 2021-11-13 DIAGNOSIS — I10 Essential (primary) hypertension: Secondary | ICD-10-CM | POA: Diagnosis not present

## 2021-11-25 DIAGNOSIS — J4530 Mild persistent asthma, uncomplicated: Secondary | ICD-10-CM | POA: Diagnosis not present

## 2021-12-14 DIAGNOSIS — E1165 Type 2 diabetes mellitus with hyperglycemia: Secondary | ICD-10-CM | POA: Diagnosis not present

## 2021-12-14 DIAGNOSIS — I10 Essential (primary) hypertension: Secondary | ICD-10-CM | POA: Diagnosis not present

## 2021-12-26 DIAGNOSIS — J4530 Mild persistent asthma, uncomplicated: Secondary | ICD-10-CM | POA: Diagnosis not present

## 2021-12-28 DIAGNOSIS — E1140 Type 2 diabetes mellitus with diabetic neuropathy, unspecified: Secondary | ICD-10-CM | POA: Diagnosis not present

## 2021-12-28 DIAGNOSIS — M79674 Pain in right toe(s): Secondary | ICD-10-CM | POA: Diagnosis not present

## 2021-12-28 DIAGNOSIS — I739 Peripheral vascular disease, unspecified: Secondary | ICD-10-CM | POA: Diagnosis not present

## 2021-12-28 DIAGNOSIS — M79671 Pain in right foot: Secondary | ICD-10-CM | POA: Diagnosis not present

## 2021-12-28 DIAGNOSIS — M79672 Pain in left foot: Secondary | ICD-10-CM | POA: Diagnosis not present

## 2021-12-28 DIAGNOSIS — M79675 Pain in left toe(s): Secondary | ICD-10-CM | POA: Diagnosis not present

## 2021-12-28 DIAGNOSIS — L110 Acquired keratosis follicularis: Secondary | ICD-10-CM | POA: Diagnosis not present

## 2022-01-26 DIAGNOSIS — J4530 Mild persistent asthma, uncomplicated: Secondary | ICD-10-CM | POA: Diagnosis not present

## 2022-01-31 ENCOUNTER — Other Ambulatory Visit (HOSPITAL_COMMUNITY): Payer: Self-pay | Admitting: Gerontology

## 2022-01-31 ENCOUNTER — Ambulatory Visit (HOSPITAL_COMMUNITY)
Admission: RE | Admit: 2022-01-31 | Discharge: 2022-01-31 | Disposition: A | Discharge status: Home / Self Care | Payer: Medicare Other | Source: Ambulatory Visit | Attending: Gerontology | Admitting: Gerontology

## 2022-01-31 ENCOUNTER — Other Ambulatory Visit (HOSPITAL_COMMUNITY): Payer: Self-pay | Admitting: Psychiatry

## 2022-01-31 ENCOUNTER — Other Ambulatory Visit (HOSPITAL_COMMUNITY)
Admission: RE | Admit: 2022-01-31 | Discharge: 2022-01-31 | Disposition: A | Discharge status: Home / Self Care | Payer: Medicare Other | Source: Ambulatory Visit | Attending: Internal Medicine | Admitting: Internal Medicine

## 2022-01-31 ENCOUNTER — Other Ambulatory Visit: Payer: Self-pay

## 2022-01-31 DIAGNOSIS — M545 Low back pain, unspecified: Secondary | ICD-10-CM | POA: Diagnosis not present

## 2022-01-31 DIAGNOSIS — N1832 Chronic kidney disease, stage 3b: Secondary | ICD-10-CM | POA: Diagnosis not present

## 2022-01-31 DIAGNOSIS — Z0001 Encounter for general adult medical examination with abnormal findings: Secondary | ICD-10-CM | POA: Insufficient documentation

## 2022-01-31 DIAGNOSIS — I10 Essential (primary) hypertension: Secondary | ICD-10-CM | POA: Insufficient documentation

## 2022-01-31 DIAGNOSIS — E1165 Type 2 diabetes mellitus with hyperglycemia: Secondary | ICD-10-CM | POA: Insufficient documentation

## 2022-01-31 DIAGNOSIS — Z1389 Encounter for screening for other disorder: Secondary | ICD-10-CM | POA: Diagnosis not present

## 2022-01-31 DIAGNOSIS — M47816 Spondylosis without myelopathy or radiculopathy, lumbar region: Secondary | ICD-10-CM | POA: Diagnosis not present

## 2022-01-31 LAB — LIPID PANEL
Cholesterol: 137 mg/dL (ref 0–200)
HDL: 44 mg/dL (ref >40)
LDL Cholesterol: 70 mg/dL (ref 0–99)
Total CHOL/HDL Ratio: 3.1 RATIO
Triglycerides: 117 mg/dL (ref <150)
VLDL: 23 mg/dL (ref 0–40)

## 2022-01-31 LAB — CBC WITH DIFFERENTIAL/PLATELET
Abs Immature Granulocytes: 0.02 10*3/uL (ref 0.00–0.07)
Basophils Absolute: 0.1 10*3/uL (ref 0.0–0.1)
Basophils Relative: 1 %
Eosinophils Absolute: 0.2 10*3/uL (ref 0.0–0.5)
Eosinophils Relative: 3 %
HCT: 36.4 % (ref 36.0–46.0)
Hemoglobin: 12.1 g/dL (ref 12.0–15.0)
Immature Granulocytes: 0 %
Lymphocytes Relative: 33 %
Lymphs Abs: 2.7 10*3/uL (ref 0.7–4.0)
MCH: 33.2 pg (ref 26.0–34.0)
MCHC: 33.2 g/dL (ref 30.0–36.0)
MCV: 99.7 fL (ref 80.0–100.0)
Monocytes Absolute: 0.6 10*3/uL (ref 0.1–1.0)
Monocytes Relative: 7 %
Neutro Abs: 4.5 10*3/uL (ref 1.7–7.7)
Neutrophils Relative %: 56 %
Platelets: 225 10*3/uL (ref 150–400)
RBC: 3.65 MIL/uL — ABNORMAL LOW (ref 3.87–5.11)
RDW: 13 % (ref 11.5–15.5)
WBC: 8.1 10*3/uL (ref 4.0–10.5)
nRBC: 0 % (ref 0.0–0.2)

## 2022-01-31 LAB — BASIC METABOLIC PANEL
Anion gap: 10 (ref 5–15)
BUN: 21 mg/dL (ref 8–23)
CO2: 27 mmol/L (ref 22–32)
Calcium: 9.4 mg/dL (ref 8.9–10.3)
Chloride: 104 mmol/L (ref 98–111)
Creatinine, Ser: 1.23 mg/dL — ABNORMAL HIGH (ref 0.44–1.00)
GFR, Estimated: 46 mL/min — ABNORMAL LOW (ref >60)
Glucose, Bld: 106 mg/dL — ABNORMAL HIGH (ref 70–99)
Potassium: 3.7 mmol/L (ref 3.5–5.1)
Sodium: 141 mmol/L (ref 135–145)

## 2022-01-31 LAB — HEPATIC FUNCTION PANEL
ALT: 13 U/L (ref 0–44)
AST: 18 U/L (ref 15–41)
Albumin: 4 g/dL (ref 3.5–5.0)
Alkaline Phosphatase: 63 U/L (ref 38–126)
Bilirubin, Direct: 0.1 mg/dL (ref 0.0–0.2)
Indirect Bilirubin: 0.4 mg/dL (ref 0.3–0.9)
Total Bilirubin: 0.5 mg/dL (ref 0.3–1.2)
Total Protein: 6.8 g/dL (ref 6.5–8.1)

## 2022-02-01 LAB — MICROALBUMIN / CREATININE URINE RATIO
Creatinine, Urine: 108.7 mg/dL
Microalb Creat Ratio: 8 mg/g creat (ref 0–29)
Microalb, Ur: 8.2 ug/mL — ABNORMAL HIGH

## 2022-02-01 LAB — HEMOGLOBIN A1C
Hgb A1c MFr Bld: 6 % — ABNORMAL HIGH (ref 4.8–5.6)
Mean Plasma Glucose: 126 mg/dL

## 2022-02-28 DIAGNOSIS — E1165 Type 2 diabetes mellitus with hyperglycemia: Secondary | ICD-10-CM | POA: Diagnosis not present

## 2022-02-28 DIAGNOSIS — I10 Essential (primary) hypertension: Secondary | ICD-10-CM | POA: Diagnosis not present

## 2022-03-16 ENCOUNTER — Ambulatory Visit (INDEPENDENT_AMBULATORY_CARE_PROVIDER_SITE_OTHER): Payer: Medicare Other | Admitting: Orthopedic Surgery

## 2022-03-16 ENCOUNTER — Encounter: Payer: Self-pay | Admitting: Orthopedic Surgery

## 2022-03-16 VITALS — BP 137/69 | HR 63 | Ht 64.0 in | Wt 242.0 lb

## 2022-03-16 DIAGNOSIS — M545 Low back pain, unspecified: Secondary | ICD-10-CM | POA: Diagnosis not present

## 2022-03-16 DIAGNOSIS — G8929 Other chronic pain: Secondary | ICD-10-CM

## 2022-03-16 NOTE — Progress Notes (Signed)
New Patient Visit ? ?Assessment: ?Brianna Manning is a 76 y.o. female with the following: ?1. Chronic bilateral low back pain, unspecified whether sciatica present ? ? ?Plan: ?Brianna Manning has chronic low back pain.  Occasionally, she will have numbness and tingling into the lateral aspect of bilateral legs.  No prior injury.  She does have a history of back pain, which responded well to therapy and injections in the past.  She states she cannot stand for longer than 5 minutes.  Based on all this, I recommended physical therapy.  Continue with her current medications.  Heat and ice as needed.  If she continues to have issues in 6 to 8 weeks, we can consider obtaining an MRI with further consideration for an injection or evaluation by a back specialist. ? ?The patient meets the AMA guidelines for Morbid obesity with BMI > 40.  The patient has been counseled on weight loss.  ? ? ?Follow-up: ?No follow-ups on file. ? ?Subjective: ? ?Chief Complaint  ?Patient presents with  ? Back Pain  ?  Lower back pain that radiates down the legs-had epidural shots in the past got relief but pain has come back  ? ? ?History of Present Illness: ?Brianna Manning is a 76 y.o. female who has been referred by Avon Gully, MD for evaluation of low back pain.  She states she has had back pain for several years.  More than 10 years ago, she did some therapy and had injections, which helped with her pain.  Over the past year, she notes progressively worsening lower back pain.  She has difficulty standing or walking for more than 4-5 minutes.  After a few minutes, she starts to experience some numbness and tingling in the lateral aspect of both legs.  She is taking Tylenol, with limited improvement in her symptoms.  She cannot take ibuprofen.  She has tried heat and ice.  Once again, limited improvement in her symptoms. ? ? ?Review of Systems: ?No fevers or chills ?+ numbness and tingling ?No chest pain ?No shortness of breath ?No  bowel or bladder dysfunction ?No GI distress ?No headaches ? ? ?Medical History: ? ?Past Medical History:  ?Diagnosis Date  ? Asthma   ? Chronic back pain   ? Diabetes (HCC)   ? Gout   ? History of Holter monitoring 2012  ? for palpitations and dyspnea; no acute findings  ? Hypertension   ? Migraine   ? ? ?Past Surgical History:  ?Procedure Laterality Date  ? ABDOMINAL HYSTERECTOMY    ? BREAST BIOPSY Right   ? benign  ? BREAST SURGERY    ? biopsy  ? CATARACT EXTRACTION W/PHACO Right 02/05/2021  ? Procedure: CATARACT EXTRACTION PHACO AND INTRAOCULAR LENS PLACEMENT (IOC);  Surgeon: [name], MD;  Location: [facility];  Service: Ophthalmology;  Laterality: Right;  CDE 5.55  ? CATARACT EXTRACTION W/PHACO Left 03/08/2021  ? Procedure: CATARACT EXTRACTION PHACO AND INTRAOCULAR LENS PLACEMENT LEFT EYE;  Surgeon: [name], MD;  Location: [facility];  Service: Ophthalmology;  Laterality: Left;  left ?CDE:13.59  ? COLONOSCOPY  01/2011  ? Dr. Jena Gauss: anal papilla, normal colon. Next TCS 2022.   ? ESOPHAGOGASTRODUODENOSCOPY N/A 05/15/2019  ? Procedure: ESOPHAGOGASTRODUODENOSCOPY (EGD);  Surgeon: Corbin Ade, MD;  Location: [facility];  Service: Endoscopy;  Laterality: N/A;  11:15am  ? ? ?Family History  ?Problem Relation Age of Onset  ? Stroke Father   ? Heart disease Mother   ? Hypertension  Mother   ? Heart attack Mother   ? Seizures Brother   ? Hypertension Brother   ? Diabetes Sister   ? Hypertension Sister   ? Heart disease Sister   ? Breast cancer Sister   ? Diabetes Sister   ? Hypertension Sister   ? Colon cancer Neg Hx   ? ?Social History  ? ?Tobacco Use  ? Smoking status: Never  ? Smokeless tobacco: Never  ?Vaping Use  ? Vaping Use: Never used  ?Substance Use Topics  ? Alcohol use: No  ? Drug use: No  ? ? ?No Known Allergies ? ?Current Meds  ?Medication Sig  ? acetaminophen (TYLENOL) 650 MG CR tablet Take 1,300 mg by mouth every 8 (eight) hours as needed for pain.  ? albuterol (PROVENTIL HFA;VENTOLIN HFA) 108 (90  Base) MCG/ACT inhaler Inhale 2 puffs into the lungs every 6 (six) hours as needed for wheezing or shortness of breath.  ? Ascorbic Acid (VITAMIN C) 100 MG tablet Take 100 mg by mouth daily.  ? aspirin EC 81 MG tablet Take 81 mg by mouth daily as needed for mild pain or moderate pain.   ? atorvastatin (LIPITOR) 20 MG tablet Take 20 mg by mouth every morning.  ? fluticasone (FLONASE) 50 MCG/ACT nasal spray Place 1 spray into both nostrils daily.  ? gabapentin (NEURONTIN) 100 MG capsule Take 100 mg by mouth at bedtime.  ? hydrochlorothiazide (MICROZIDE) 12.5 MG capsule Take 12.5 mg by mouth daily.  ? losartan (COZAAR) 100 MG tablet Take 100 mg by mouth every morning.  ? lubiprostone (AMITIZA) 8 MCG capsule Take 1 capsule (8 mcg total) by mouth 2 (two) times daily with a meal.  ? metFORMIN (GLUCOPHAGE) 500 MG tablet Take 500 mg by mouth 2 (two) times daily with a meal.  ? metoprolol tartrate (LOPRESSOR) 50 MG tablet TAKE 1 TABLET BY MOUTH TWICE DAILY. (Patient taking differently: Take 50 mg by mouth 2 (two) times daily.)  ? Omega-3 Fatty Acids (FISH OIL) 1000 MG CAPS Take 1,000 mg by mouth daily.  ? omeprazole (PRILOSEC) 20 MG capsule Take 20 mg by mouth every morning.   ? Vitamin D, Cholecalciferol, 25 MCG (1000 UT) TABS Take 1,000 Units by mouth daily.  ? vitamin E 180 MG (400 UNITS) capsule Take 400 Units by mouth daily.  ? ? ?Objective: ?BP 137/69   Pulse 63   Ht 5\' 4"  (1.626 m)   Wt 242 lb (109.8 kg)   BMI 41.54 kg/m?  ? ?Physical Exam: ? ?General: Alert and oriented., No acute distress., and Obese female. ?Gait: Ambulates with the assistance of a cane. ? ?Tenderness to palpation along the lower back.  Negative straight leg raise bilaterally.  Bilateral lower extremity strength is 5/5.  Bilateral patellar tendon reflexes are 1+.  Sensation is intact throughout all dermatomal distributions. ? ?IMAGING: ?I personally reviewed images previously obtained in clinic ? ?Lumbar spine x-ray was previously obtained.   Evidence of arthritis, including anterior osteophytes.  Disc heights are maintained.  No evidence of anterolisthesis. ? ? ?New Medications:  ?No orders of the defined types were placed in this encounter. ? ? ? ? ? , MD ? ?03/16/2022 ?8:50 AM ? ? ?

## 2022-03-22 DIAGNOSIS — M79674 Pain in right toe(s): Secondary | ICD-10-CM | POA: Diagnosis not present

## 2022-03-22 DIAGNOSIS — M79675 Pain in left toe(s): Secondary | ICD-10-CM | POA: Diagnosis not present

## 2022-03-22 DIAGNOSIS — E1140 Type 2 diabetes mellitus with diabetic neuropathy, unspecified: Secondary | ICD-10-CM | POA: Diagnosis not present

## 2022-03-22 DIAGNOSIS — M79671 Pain in right foot: Secondary | ICD-10-CM | POA: Diagnosis not present

## 2022-03-22 DIAGNOSIS — L110 Acquired keratosis follicularis: Secondary | ICD-10-CM | POA: Diagnosis not present

## 2022-03-22 DIAGNOSIS — M79672 Pain in left foot: Secondary | ICD-10-CM | POA: Diagnosis not present

## 2022-03-22 DIAGNOSIS — I739 Peripheral vascular disease, unspecified: Secondary | ICD-10-CM | POA: Diagnosis not present

## 2022-03-30 ENCOUNTER — Ambulatory Visit: Payer: Medicare Other | Admitting: Orthopedic Surgery

## 2022-03-31 DIAGNOSIS — E1165 Type 2 diabetes mellitus with hyperglycemia: Secondary | ICD-10-CM | POA: Diagnosis not present

## 2022-03-31 DIAGNOSIS — I10 Essential (primary) hypertension: Secondary | ICD-10-CM | POA: Diagnosis not present

## 2022-04-18 ENCOUNTER — Encounter (HOSPITAL_COMMUNITY): Payer: Self-pay | Admitting: Physical Therapy

## 2022-04-18 ENCOUNTER — Ambulatory Visit (HOSPITAL_COMMUNITY): Payer: Medicare Other | Attending: Orthopedic Surgery | Admitting: Physical Therapy

## 2022-04-18 DIAGNOSIS — R2689 Other abnormalities of gait and mobility: Secondary | ICD-10-CM

## 2022-04-18 DIAGNOSIS — M545 Low back pain, unspecified: Secondary | ICD-10-CM | POA: Diagnosis not present

## 2022-04-18 DIAGNOSIS — M6281 Muscle weakness (generalized): Secondary | ICD-10-CM | POA: Diagnosis not present

## 2022-04-18 DIAGNOSIS — G8929 Other chronic pain: Secondary | ICD-10-CM | POA: Diagnosis not present

## 2022-04-18 DIAGNOSIS — M5459 Other low back pain: Secondary | ICD-10-CM | POA: Diagnosis not present

## 2022-04-18 DIAGNOSIS — R29898 Other symptoms and signs involving the musculoskeletal system: Secondary | ICD-10-CM

## 2022-04-18 NOTE — Therapy (Signed)
?OUTPATIENT PHYSICAL THERAPY THORACOLUMBAR EVALUATION ? ? ?Patient Name: Brianna Manning ?MRN: [id number] ?DOB:05/14/1946, 76 y.o., female ?Today's Date: 04/18/2022 ? ? PT End of Session - 04/18/22 0829   ? ? Visit Number 1   ? Number of Visits 12   ? Date for PT Re-Evaluation 05/30/22   ? Authorization Type [employer] (no visit limit, no auth)   ? Progress Note Due on Visit 10   ? PT Start Time 0830   ? PT Stop Time 0908   ? PT Time Calculation (min) 38 min   ? Activity Tolerance Patient tolerated treatment well   ? [facility] tasks assessed/performed   ? ?  ?  ? ?  ? ? ?Past Medical History:  ?Diagnosis Date  ? Asthma   ? Chronic back pain   ? Diabetes (HCC)   ? Gout   ? History of Holter monitoring 2012  ? for palpitations and dyspnea; no acute findings  ? Hypertension   ? Migraine   ? ?Past Surgical History:  ?Procedure Laterality Date  ? ABDOMINAL HYSTERECTOMY    ? BREAST BIOPSY Right   ? benign  ? BREAST SURGERY    ? biopsy  ? CATARACT EXTRACTION W/PHACO Right 02/05/2021  ? Procedure: CATARACT EXTRACTION PHACO AND INTRAOCULAR LENS PLACEMENT (IOC);  Surgeon: Fabio PierceWrzosek, James, MD;  Location: [facility];  Service: Ophthalmology;  Laterality: Right;  CDE 5.55  ? CATARACT EXTRACTION W/PHACO Left 03/08/2021  ? Procedure: CATARACT EXTRACTION PHACO AND INTRAOCULAR LENS PLACEMENT LEFT EYE;  Surgeon: Fabio PierceWrzosek, James, MD;  Location: [facility];  Service: Ophthalmology;  Laterality: Left;  left ?CDE:13.59  ? COLONOSCOPY  01/2011  ? Dr. Jena Gaussourk: anal papilla, normal colon. Next TCS 2022.   ? ESOPHAGOGASTRODUODENOSCOPY N/A 05/15/2019  ? Procedure: ESOPHAGOGASTRODUODENOSCOPY (EGD);  Surgeon: Corbin Adeourk, Robert M, MD;  Location: [facility];  Service: Endoscopy;  Laterality: N/A;  11:15am  ? ?Patient Active Problem List  ? Diagnosis Date Noted  ? Chronic RUQ pain 02/18/2019  ? Constipation 02/18/2019  ? GERD (gastroesophageal reflux disease) 02/18/2019  ? Mixed incontinence urge and stress 02/16/2017  ? Pilonidal cyst  02/16/2017  ? Vaginal pain 02/16/2017  ? Vaginal odor 02/16/2017  ? Urinary frequency 02/16/2017  ? Postmenopausal 02/16/2017  ? History of hysterectomy 02/16/2017  ? ? ?PCP: Fuller Songesfaye Desmissie Fanta MD ? ?REFERRING PROVIDER: Oliver Barreairns, Mark A, MD  ? ?REFERRING DIAG: M54.50,G89.29 (ICD-10-CM) - Chronic bilateral low back pain, unspecified whether sciatica present   ? ?THERAPY DIAG:  ?Other low back pain ? ?Muscle weakness (generalized) ? ?Other abnormalities of gait and mobility ? ?Other symptoms and signs involving the musculoskeletal system ? ?ONSET DATE: 2002 ? ?SUBJECTIVE:                                                                                                                                                                                          ? ?  SUBJECTIVE STATEMENT: ?Patient states her back and legs and knees bother her. She went to store yesterday and that increased her pain. She had trouble sleeping last night because it was hurting her. Symptoms increase with walking standing. Symptoms don't decrease. She has symptoms down to feet. Can stand/walk about 5-10 minutes at a time.  ?PERTINENT HISTORY:  ?DM ? ?PAIN:  ?Are you having pain? Yes: NPRS scale: 7-8/10 ?Pain location: back and legs ?Pain description: achy ?Aggravating factors: standing/walking ?Relieving factors: none ? ? ?PRECAUTIONS: None ? ?WEIGHT BEARING RESTRICTIONS No ? ?FALLS:  ?Has patient fallen in last 6 months? No ? ?LIVING ENVIRONMENT: ?Lives with: lives alone ?Lives in: House/apartment ?Stairs: Yes: External: 3 steps; on right going up, on left going up, and can reach both ?Has following equipment at home: Single point cane, Walker - 4 wheeled, and shower chair ? ?OCCUPATION: Retired ? ?PLOF: Independent ? ?PATIENT GOALS get her back together so she can stand up and walk ? ? ?OBJECTIVE:  ? ?DIAGNOSTIC FINDINGS:  ?2/20 XR FINDINGS: ?No malalignment. No fractures. Multilevel degenerative disc disease, ?moderate. Facet degenerative  changes lumbar spine, particularly the ?lower lumbar spine. No other abnormalities. ? IMPRESSION: ?Degenerative changes as above. ? ?PATIENT SURVEYS:  ?FOTO 34% function ? ?SCREENING FOR RED FLAGS: ?Bowel or bladder incontinence: No ?Spinal tumors: No ?Cauda equina syndrome: No ?Compression fracture: No ?Abdominal aneurysm: No ? ?COGNITION: ? Overall cognitive status: Within functional limits for tasks assessed   ?  ?SENSATION: ?Light touch: increased S1 on R ? ? ?POSTURE:  ?Slouched in seated ? ?PALPATION: ?TTP lower lumbar paraspinals  ? ?LUMBAR ROM: pain with all AROM ? ?Active  A/PROM  ?04/18/2022  ?Flexion 25% limited  ?Extension 75% limited  ?Right lateral flexion 50% limited  ?Left lateral flexion 50% limited  ?Right rotation 50% limited  ?Left rotation 50% limited  ? (Blank rows = not tested) ? ?LE ROM: ? ?Active  Right ?04/18/2022 Left ?04/18/2022  ?Hip flexion    ?Hip extension    ?Hip abduction    ?Hip adduction    ?Hip internal rotation    ?Hip external rotation    ?Knee flexion    ?Knee extension    ?Ankle dorsiflexion    ?Ankle plantarflexion    ?Ankle inversion    ?Ankle eversion    ? (Blank rows = not tested) ? ?LE MMT: ? ?MMT Right ?04/18/2022 Left ?04/18/2022  ?Hip flexion 3+/5 3+/5  ?Hip extension    ?Hip abduction    ?Hip adduction    ?Hip internal rotation    ?Hip external rotation    ?Knee flexion 4-/5 4+/5  ?Knee extension 4/5 4+/5  ?Ankle dorsiflexion 4-/5 4+/5  ?Ankle plantarflexion    ?Ankle inversion    ?Ankle eversion    ? (Blank rows = not tested) ? ? ?FUNCTIONAL TESTS:  ?5 times sit to stand: 28.05 seconds with bilateral UE support on thighs, increasing fatigue, decreased eccentric control ?Transfers: labored with bilateral UE support ? ?GAIT: ?Distance walked: 100 feet ?Assistive device utilized: Single point cane ?Level of assistance: Modified independence ?Comments: knee valgus, wide BOS, poor foot clearance, trunk flexed ? ? ? ?TODAY'S TREATMENT  ?04/18/22 ?Seated alternating march 10 x 5  second holds bilateral ?LAQ 10 x 5 second holds bilateral  ? ? ?PATIENT EDUCATION:  ?Education details: Patient educated on exam findings, POC, scope of PT, HEP.  ?Person educated: Patient ?Education method: Explanation, Demonstration, and Handouts ?Education comprehension: verbalized understanding, returned demonstration, verbal cues required, and tactile  cues required ? ? ? ?HOME EXERCISE PROGRAM: ?5/8/23Access Code: [email or web address] ?- Seated March  - 3 x daily - 7 x weekly - 10 reps - 5 second hold ?- Seated Long Arc Quad  - 3 x daily - 7 x weekly - 10 reps - 5 second hold ? ?ASSESSMENT: ? ?CLINICAL IMPRESSION: ?Patient a 76 y.o. y.o. female who was seen today for physical therapy evaluation and treatment for low back pain, leg pain, and weakness. Patient will continue to benefit from physical therapy in order to improve function and reduce impairment. ? ? ?OBJECTIVE IMPAIRMENTS Abnormal gait, decreased activity tolerance, decreased mobility, difficulty walking, decreased ROM, decreased strength, increased muscle spasms, improper body mechanics, postural dysfunction, obesity, and pain.  ? ?ACTIVITY LIMITATIONS cleaning, community activity, meal prep, laundry, yard work, and shopping.  ? ?PERSONAL FACTORS Fitness, Time since onset of injury/illness/exacerbation, and 3+ comorbidities: chronic back pain, increased BMI, DM, CHF, HTN  are also affecting patient's functional outcome.  ? ? ?REHAB POTENTIAL: Good ? ?CLINICAL DECISION MAKING: Stable/uncomplicated ? ?EVALUATION COMPLEXITY: Low ? ? ?GOALS: ?Goals reviewed with patient? No ? ?SHORT TERM GOALS: Target date: 05/09/2022 ? ?Patient will be independent with HEP in order to improve functional outcomes. ?Baseline:  ?Goal status: INITIAL ? ?2.  Patient will report at least 25% improvement in symptoms for improved quality of life. ?Baseline:  ?Goal status: INITIAL ? ?3.  Patient will be able to transfer to standing without UE support to demonstrate improved LE  strength. ?Baseline: bilateral UE support ?Goal status: INITIAL ? ? ?LONG TERM GOALS: Target date: 05/30/2022 ? ?Patient will report at least 75% improvement in symptoms for improved quality of life. ?Baseline:  ?Goal status: INI

## 2022-04-20 ENCOUNTER — Encounter (HOSPITAL_COMMUNITY): Payer: Self-pay

## 2022-04-20 ENCOUNTER — Ambulatory Visit (HOSPITAL_COMMUNITY): Payer: Medicare Other

## 2022-04-20 DIAGNOSIS — R29898 Other symptoms and signs involving the musculoskeletal system: Secondary | ICD-10-CM

## 2022-04-20 DIAGNOSIS — M5459 Other low back pain: Secondary | ICD-10-CM

## 2022-04-20 DIAGNOSIS — M545 Low back pain, unspecified: Secondary | ICD-10-CM | POA: Diagnosis not present

## 2022-04-20 DIAGNOSIS — M6281 Muscle weakness (generalized): Secondary | ICD-10-CM

## 2022-04-20 DIAGNOSIS — R2689 Other abnormalities of gait and mobility: Secondary | ICD-10-CM | POA: Diagnosis not present

## 2022-04-20 DIAGNOSIS — G8929 Other chronic pain: Secondary | ICD-10-CM | POA: Diagnosis not present

## 2022-04-20 NOTE — Therapy (Signed)
?OUTPATIENT PHYSICAL THERAPY TREATMENT NOTE ? ? ?Patient Name: Brianna Manning ?MRN: [id number] ?DOB:20-Oct-1946, 76 y.o., female ?Today's Date: 04/20/2022 ? ?PCP: Fuller Song MD ?REFERRING PROVIDER: Oliver Barre, MD  ? ?END OF SESSION:  ? PT End of Session - 04/20/22 1033   ? ? Visit Number 2   ? Number of Visits 12   ? Date for PT Re-Evaluation 05/30/22   ? Authorization Type [employer] Medicare (no visit limit, no auth)   ? Progress Note Due on Visit 10   ? PT Start Time 1004   ? PT Stop Time 1042   ? PT Time Calculation (min) 38 min   ? Activity Tolerance Patient tolerated treatment well   ? Behavior During Therapy [facility] for tasks assessed/performed   ? ?  ?  ? ?  ? ? ?Past Medical History:  ?Diagnosis Date  ? Asthma   ? Chronic back pain   ? Diabetes (HCC)   ? Gout   ? History of Holter monitoring 2012  ? for palpitations and dyspnea; no acute findings  ? Hypertension   ? Migraine   ? ?Past Surgical History:  ?Procedure Laterality Date  ? ABDOMINAL HYSTERECTOMY    ? BREAST BIOPSY Right   ? benign  ? BREAST SURGERY    ? biopsy  ? CATARACT EXTRACTION W/PHACO Right 02/05/2021  ? Procedure: CATARACT EXTRACTION PHACO AND INTRAOCULAR LENS PLACEMENT (IOC);  Surgeon: [name], MD;  Location: [facility];  Service: Ophthalmology;  Laterality: Right;  CDE 5.55  ? CATARACT EXTRACTION W/PHACO Left 03/08/2021  ? Procedure: CATARACT EXTRACTION PHACO AND INTRAOCULAR LENS PLACEMENT LEFT EYE;  Surgeon: [name], MD;  Location: [facility];  Service: Ophthalmology;  Laterality: Left;  left ?CDE:13.59  ? COLONOSCOPY  01/2011  ? Dr. Jena Gauss: anal papilla, normal colon. Next TCS 2022.   ? ESOPHAGOGASTRODUODENOSCOPY N/A 05/15/2019  ? Procedure: ESOPHAGOGASTRODUODENOSCOPY (EGD);  Surgeon: Corbin Ade, MD;  Location: [facility];  Service: Endoscopy;  Laterality: N/A;  11:15am  ? ?Patient Active Problem List  ? Diagnosis Date Noted  ? Chronic RUQ pain 02/18/2019  ? Constipation 02/18/2019  ? GERD (gastroesophageal reflux  disease) 02/18/2019  ? Mixed incontinence urge and stress 02/16/2017  ? Pilonidal cyst 02/16/2017  ? Vaginal pain 02/16/2017  ? Vaginal odor 02/16/2017  ? Urinary frequency 02/16/2017  ? Postmenopausal 02/16/2017  ? History of hysterectomy 02/16/2017  ? ? ?REFERRING DIAG: M54.50,G89.29 (ICD-10-CM) - Chronic bilateral low back pain, unspecified whether sciatica present  ? ?THERAPY DIAG:  ?Other low back pain ?  ?Muscle weakness (generalized) ?  ?Other abnormalities of gait and mobility ?  ?Other symptoms and signs involving the musculoskeletal system ? ?PERTINENT HISTORY: DM ? ?PRECAUTIONS: None ? ?SUBJECTIVE: Pt stated she has difficulty sleeping due to pain.  Reports she has constant pain in back and bil knees. ? ?PAIN:  ?Are you having pain? Yes: NPRS scale: 5/10 ?Pain location: LBP and Bil knees ?Pain description: Tender ?Aggravating factors: moving, can't stand or sit for long periods of time. ?Relieving factors: sleep ? ? ?OBJECTIVE: (objective measures completed at initial evaluation unless otherwise dated) ? ? ? ?OBJECTIVE:  ?  ?DIAGNOSTIC FINDINGS:  ?2/20 XR FINDINGS: ?No malalignment. No fractures. Multilevel degenerative disc disease, ?moderate. Facet degenerative changes lumbar spine, particularly the ?lower lumbar spine. No other abnormalities. ? IMPRESSION: ?Degenerative changes as above. ?  ?PATIENT SURVEYS:  ?FOTO 34% function ?  ?SCREENING FOR RED FLAGS: ?Bowel or bladder incontinence: No ?Spinal tumors: No ?  Cauda equina syndrome: No ?Compression fracture: No ?Abdominal aneurysm: No ?  ?COGNITION: ?          Overall cognitive status: Within functional limits for tasks assessed               ?           ?SENSATION: ?Light touch: increased S1 on R ?  ?  ?POSTURE:  ?Slouched in seated ?  ?PALPATION: ?TTP lower lumbar paraspinals  ?  ?LUMBAR ROM: pain with all AROM ?  ?Active  A/PROM  ?04/18/2022  ?Flexion 25% limited  ?Extension 75% limited  ?Right lateral flexion 50% limited  ?Left lateral flexion 50%  limited  ?Right rotation 50% limited  ?Left rotation 50% limited  ? (Blank rows = not tested) ?  ?LE ROM: ?  ?Active  Right ?04/18/2022 Left ?04/18/2022  ?Hip flexion      ?Hip extension      ?Hip abduction      ?Hip adduction      ?Hip internal rotation      ?Hip external rotation      ?Knee flexion      ?Knee extension      ?Ankle dorsiflexion      ?Ankle plantarflexion      ?Ankle inversion      ?Ankle eversion      ? (Blank rows = not tested) ?  ?LE MMT: ?  ?MMT Right ?04/18/2022 Left ?04/18/2022  ?Hip flexion 3+/5 3+/5  ?Hip extension      ?Hip abduction      ?Hip adduction      ?Hip internal rotation      ?Hip external rotation      ?Knee flexion 4-/5 4+/5  ?Knee extension 4/5 4+/5  ?Ankle dorsiflexion 4-/5 4+/5  ?Ankle plantarflexion      ?Ankle inversion      ?Ankle eversion      ? (Blank rows = not tested) ?  ?  ?FUNCTIONAL TESTS:  ?5 times sit to stand: 28.05 seconds with bilateral UE support on thighs, increasing fatigue, decreased eccentric control ?Transfers: labored with bilateral UE support ?  ?GAIT: ?Distance walked: 100 feet ?Assistive device utilized: Single point cane ?Level of assistance: Modified independence ?Comments: knee valgus, wide BOS, poor foot clearance, trunk flexed ?  ?  ?  ?TODAY'S TREATMENT  ?04/20/22 ?Log rolling ?Seated: alternating march 10 x 5 second holds bilateral ?LAQ 10 x 5 second holds bilateral  ?   5STS ?Supine: ? Bridge ? Marching with abdominal sets ? LTR 5x 10" ?2MWT 196 ft ?04/18/22 ?Seated alternating march 10 x 5 second holds bilateral ?LAQ 10 x 5 second holds bilateral  ?  ?  ?PATIENT EDUCATION:  ?Education details: Patient educated on exam findings, POC, scope of PT, HEP.  ?Person educated: Patient ?Education method: Explanation, Demonstration, and Handouts ?Education comprehension: verbalized understanding, returned demonstration, verbal cues required, and tactile cues required ?  ?  ?  ?HOME EXERCISE PROGRAM: ?5/8/23Access Code: [email or web address] ?- Seated March  - 3 x daily - 7 x  weekly - 10 reps - 5 second hold ?- Seated Long Arc Quad  - 3 x daily - 7 x weekly - 10 reps - 5 second hold ?  ? 5/10: bridge and supine bent knee raise with ab set ?ASSESSMENT: ?  ?CLINICAL IMPRESSION: ?Reviewed goals, educated importance of HEP compliance for maximal benefits, pt stated she has began without question.  Pt educated on log rolling mechanics to reduce strain  on lumbar musculature.  Session focus with core and proximal strengthening.  Pt presents with significant abdominal weakness, required cueing to improve breathing paired with abdominal sets.  Added proximal strengthening exercises cueing for stability with supine exercises, add to HEP with printout given. ?  ?OBJECTIVE IMPAIRMENTS Abnormal gait, decreased activity tolerance, decreased mobility, difficulty walking, decreased ROM, decreased strength, increased muscle spasms, improper body mechanics, postural dysfunction, obesity, and pain.  ?  ?ACTIVITY LIMITATIONS cleaning, community activity, meal prep, laundry, yard work, and shopping.  ?  ?PERSONAL FACTORS Fitness, Time since onset of injury/illness/exacerbation, and 3+ comorbidities: chronic back pain, increased BMI, DM, CHF, HTN  are also affecting patient's functional outcome.  ?  ?  ?REHAB POTENTIAL: Good ?  ?CLINICAL DECISION MAKING: Stable/uncomplicated ?  ?EVALUATION COMPLEXITY: Low ?  ?  ?GOALS: ?Goals reviewed with patient? No ?  ?SHORT TERM GOALS: Target date: 05/09/2022 ?  ?Patient will be independent with HEP in order to improve functional outcomes. ?Baseline:  ?Goal status: Ongoing ?  ?2.  Patient will report at least 25% improvement in symptoms for improved quality of life. ?Baseline:  ?Goal status: Ongoing ?  ?3.  Patient will be able to transfer to standing without UE support to demonstrate improved LE strength. ?Baseline: bilateral UE support ?Goal status: Ongoing ?  ?  ?LONG TERM GOALS: Target date: 05/30/2022 ?  ?Patient will report at least 75% improvement in symptoms for  improved quality of life. ?Baseline:  ?Goal status: Ongoing ?  ?2.  Patient will improve FOTO score by at least 15 points in order to indicate improved tolerance to activity. ?Baseline: 34% function ?Goal status:

## 2022-04-21 DIAGNOSIS — E119 Type 2 diabetes mellitus without complications: Secondary | ICD-10-CM | POA: Diagnosis not present

## 2022-04-25 ENCOUNTER — Ambulatory Visit (HOSPITAL_COMMUNITY): Payer: Medicare Other | Admitting: Physical Therapy

## 2022-04-25 DIAGNOSIS — R2689 Other abnormalities of gait and mobility: Secondary | ICD-10-CM

## 2022-04-25 DIAGNOSIS — R29898 Other symptoms and signs involving the musculoskeletal system: Secondary | ICD-10-CM | POA: Diagnosis not present

## 2022-04-25 DIAGNOSIS — M6281 Muscle weakness (generalized): Secondary | ICD-10-CM | POA: Diagnosis not present

## 2022-04-25 DIAGNOSIS — M5459 Other low back pain: Secondary | ICD-10-CM | POA: Diagnosis not present

## 2022-04-25 DIAGNOSIS — G8929 Other chronic pain: Secondary | ICD-10-CM | POA: Diagnosis not present

## 2022-04-25 DIAGNOSIS — M545 Low back pain, unspecified: Secondary | ICD-10-CM | POA: Diagnosis not present

## 2022-04-25 NOTE — Therapy (Addendum)
?OUTPATIENT PHYSICAL THERAPY TREATMENT NOTE ? ? ?Patient Name: Brianna Manning ?MRN: [id number] ?DOB:12/23/1945, 76 y.o., female ?Today's Date: 04/25/2022 ? ?PCP: Fuller Songesfaye Desmissie Fanta MD ?REFERRING PROVIDER: Oliver Barreairns, Mark A, MD  ? ?END OF SESSION:  ? PT End of Session - 04/25/22 1009   ? ? Visit Number 3   ? Number of Visits 12   ? Date for PT Re-Evaluation 05/30/22   ? Authorization Type [employer] Medicare (no visit limit, no auth)   ? Progress Note Due on Visit 10   ? PT Start Time 1009   ? PT Stop Time 1050   ? PT Time Calculation (min) 41 min   ? Activity Tolerance Patient tolerated treatment well   ? [facility] for tasks assessed/performed   ? ?  ?  ? ?  ? ? ?Past Medical History:  ?Diagnosis Date  ? Asthma   ? Chronic back pain   ? Diabetes (HCC)   ? Gout   ? History of Holter monitoring 2012  ? for palpitations and dyspnea; no acute findings  ? Hypertension   ? Migraine   ? ?Past Surgical History:  ?Procedure Laterality Date  ? ABDOMINAL HYSTERECTOMY    ? BREAST BIOPSY Right   ? benign  ? BREAST SURGERY    ? biopsy  ? CATARACT EXTRACTION W/PHACO Right 02/05/2021  ? Procedure: CATARACT EXTRACTION PHACO AND INTRAOCULAR LENS PLACEMENT (IOC);  Surgeon: Fabio PierceWrzosek, James, MD;  Location: [facility];  Service: Ophthalmology;  Laterality: Right;  CDE 5.55  ? CATARACT EXTRACTION W/PHACO Left 03/08/2021  ? Procedure: CATARACT EXTRACTION PHACO AND INTRAOCULAR LENS PLACEMENT LEFT EYE;  Surgeon: Fabio PierceWrzosek, James, MD;  Location: [facility];  Service: Ophthalmology;  Laterality: Left;  left ?CDE:13.59  ? COLONOSCOPY  01/2011  ? Dr. Jena Gaussourk: anal papilla, normal colon. Next TCS 2022.   ? ESOPHAGOGASTRODUODENOSCOPY N/A 05/15/2019  ? Procedure: ESOPHAGOGASTRODUODENOSCOPY (EGD);  Surgeon: Corbin Adeourk, Robert M, MD;  Location: [facility];  Service: Endoscopy;  Laterality: N/A;  11:15am  ? ?Patient Active Problem List  ? Diagnosis Date Noted  ? Chronic RUQ pain 02/18/2019  ? Constipation 02/18/2019  ? GERD (gastroesophageal reflux  disease) 02/18/2019  ? Mixed incontinence urge and stress 02/16/2017  ? Pilonidal cyst 02/16/2017  ? Vaginal pain 02/16/2017  ? Vaginal odor 02/16/2017  ? Urinary frequency 02/16/2017  ? Postmenopausal 02/16/2017  ? History of hysterectomy 02/16/2017  ? ? ?REFERRING DIAG: M54.50,G89.29 (ICD-10-CM) - Chronic bilateral low back pain, unspecified whether sciatica present  ? ?THERAPY DIAG:  ?Other low back pain ?  ?Muscle weakness (generalized) ?  ?Other abnormalities of gait and mobility ?  ?Other symptoms and signs involving the musculoskeletal system ? ?PERTINENT HISTORY: DM ? ?PRECAUTIONS: None ? ?SUBJECTIVE: Pt states that she is doing her exercises at least 2x a day  ?PAIN:  ?Are you having pain? Yes: NPRS scale: 5/10 ?Pain location: LBP and Bil knees ?Pain description: Tender ?Aggravating factors: moving, can't stand or sit for long periods of time. ?Relieving factors: sleep ? ? ?OBJECTIVE: (objective measures completed at initial evaluation unless otherwise dated) ? ? ? ?OBJECTIVE:  ?  ?DIAGNOSTIC FINDINGS:  ?2/20 XR FINDINGS: ?No malalignment. No fractures. Multilevel degenerative disc disease, ?moderate. Facet degenerative changes lumbar spine, particularly the ?lower lumbar spine. No other abnormalities. ? IMPRESSION: ?Degenerative changes as above. ?  ?PATIENT SURVEYS:  ?FOTO 34% function ? PALPATION: ?TTP lower lumbar paraspinals  ?  ?LUMBAR ROM: pain with all AROM ?  ?Active  A/PROM  ?  04/18/2022  ?Flexion 25% limited  ?Extension 75% limited  ?Right lateral flexion 50% limited  ?Left lateral flexion 50% limited  ?Right rotation 50% limited  ?Left rotation 50% limited  ? (Blank rows = not tested) ?  ?  ?LE MMT: ?  ?MMT Right ?04/18/2022 Left ?04/18/2022  ?Hip flexion 3+/5 3+/5  ?Hip extension      ?Hip abduction      ?Hip adduction      ?Hip internal rotation      ?Hip external rotation      ?Knee flexion 4-/5 4+/5  ?Knee extension 4/5 4+/5  ?Ankle dorsiflexion 4-/5 4+/5  ?Ankle plantarflexion      ?Ankle  inversion      ?Ankle eversion      ? (Blank rows = not tested) ?  ?  ?FUNCTIONAL TESTS:  ?5 times sit to stand: 28.05 seconds with bilateral UE support on thighs, increasing fatigue, decreased eccentric control ?Transfers: labored with bilateral UE support ?  ?GAIT: ?Distance walked: 100 feet ?Assistive device utilized: Single point cane ?Level of assistance: Modified independence ?Comments: knee valgus, wide BOS, poor foot clearance, trunk flexed ?  ?  ?  ?TODAY'S TREATMENT  ? ?                         04/25/22 ?                          Standing :  attempted wall arch pt unable to lift heels off floor ?                                            Standing heel raises at counter x 10 ?                                            Hip excursion x 3 ?                         Sitting :       Good sitting posture x 10 ?                                            Scapular retraction x 10 ?                                            Abdominal set x 10  ?                                            Sit to stand x 5 ?    Supine:                         ?          Bridge x 10 ?  Marching with abdominal sets ?          LTR 5x 10" ?Side lying:   hip abduction x 10 B ?04/20/22 ?Log rolling ?Seated: alternating march 10 x 5 second holds bilateral ?LAQ 10 x 5 second holds bilateral  ?   5STS ?Supine: ? Bridge ? Marching with abdominal sets ? LTR 5x 10" ? 196 ft ?04/18/22 ?Seated alternating march 10 x 5 second holds bilateral ?LAQ 10 x 5 second holds bilateral  ?  ?  ?PATIENT EDUCATION:  ?Education details: Patient educated on exam findings, POC, scope of PT, HEP.  ?Person educated: Patient ?Education method: Explanation, Demonstration, and Handouts ?Education comprehension: verbalized understanding, returned demonstration, verbal cues required, and tactile cues required ?  ?  ?  ?HOME EXERCISE PROGRAM: ?             5/15         Sitting :       Good sitting posture x 10 ?                                            Scapular  retraction x 10 ?                                            Abdominal set x 10  ?5/8/23Access Code: [email or web address] ?- Seated March  - 3 x daily - 7 x weekly - 10 reps - 5 second hold ?- Seated Long Arc Quad  - 3 x daily - 7 x weekly - 10 reps - 5 second hold ?  ? 5/10: bridge and supine bent knee raise with ab set ?ASSESSMENT: ?  ?CLINICAL IMPRESSION: ?Reviewed goals, educated importance of HEP compliance for maximal benefits, pt stated she has began without question.  Pt educated on log rolling mechanics to reduce strain on lumbar musculature.  Session focus with core and proximal strengthening.  Pt presents with significant abdominal weakness, required cueing to improve breathing paired with abdominal sets.  Added proximal strengthening exercises cueing for stability with supine exercises, add to HEP with printout given. ?  ?OBJECTIVE IMPAIRMENTS Abnormal gait, decreased activity tolerance, decreased mobility, difficulty walking, decreased ROM, decreased strength, increased muscle spasms, improper body mechanics, postural dysfunction, obesity, and pain.  ?  ?ACTIVITY LIMITATIONS cleaning, community activity, meal prep, laundry, yard work, and shopping.  ?  ?PERSONAL FACTORS Fitness, Time since onset of injury/illness/exacerbation, and 3+ comorbidities: chronic back pain, increased BMI, DM, CHF, HTN  are also affecting patient's functional outcome.  ?  ?  ?REHAB POTENTIAL: Good ?  ?CLINICAL DECISION MAKING: Stable/uncomplicated ?  ?EVALUATION COMPLEXITY: Low ?  ?  ?GOALS: ?Goals reviewed with patient? No ?  ?SHORT TERM GOALS: Target date: 05/09/2022 ?  ?Patient will be independent with HEP in order to improve functional outcomes. ?Baseline:  ?Goal status: Ongoing ?  ?2.  Patient will report at least 25% improvement in symptoms for improved quality of life. ?Baseline:  ?Goal status: Ongoing ?  ?3.  Patient will be able to transfer to standing without UE support to demonstrate improved LE strength. ?Baseline: bilateral UE  support ?Goal status: Ongoing ?  ?  ?LONG TERM GOALS: Target date: 05/30/2022 ?  ?Patient will report at least 75% improvement in symptoms for  improved quality of life. ?Baseline:  ?Goal status: Ongoing ?  ?2.  Pati

## 2022-04-28 ENCOUNTER — Ambulatory Visit (HOSPITAL_COMMUNITY): Payer: Medicare Other | Admitting: Physical Therapy

## 2022-04-28 ENCOUNTER — Encounter (HOSPITAL_COMMUNITY): Payer: Self-pay | Admitting: Physical Therapy

## 2022-04-28 DIAGNOSIS — M5459 Other low back pain: Secondary | ICD-10-CM | POA: Diagnosis not present

## 2022-04-28 DIAGNOSIS — R2689 Other abnormalities of gait and mobility: Secondary | ICD-10-CM | POA: Diagnosis not present

## 2022-04-28 DIAGNOSIS — M6281 Muscle weakness (generalized): Secondary | ICD-10-CM

## 2022-04-28 DIAGNOSIS — R29898 Other symptoms and signs involving the musculoskeletal system: Secondary | ICD-10-CM | POA: Diagnosis not present

## 2022-04-28 DIAGNOSIS — G8929 Other chronic pain: Secondary | ICD-10-CM | POA: Diagnosis not present

## 2022-04-28 DIAGNOSIS — M545 Low back pain, unspecified: Secondary | ICD-10-CM | POA: Diagnosis not present

## 2022-04-28 NOTE — Therapy (Signed)
OUTPATIENT PHYSICAL THERAPY TREATMENT NOTE   Patient Name: Brianna Manning MRN: [id number] DOB:04/01/1946, 76 y.o., female Today's Date: 04/28/2022  PCP: Fuller Songesfaye Desmissie Fanta MD REFERRING PROVIDER: Oliver Barreairns, Mark A, MD   END OF SESSION:   PT End of Session - 04/28/22 1339     Visit Number 4    Number of Visits 12    Date for PT Re-Evaluation 05/30/22    Authorization Type [employer] Medicare (no visit limit, no auth)    Progress Note Due on Visit 10    PT Start Time 1345    PT Stop Time 1425    PT Time Calculation (min) 40 min    Activity Tolerance Patient tolerated treatment well    Behavior During Therapy WFL for tasks assessed/performed             Past Medical History:  Diagnosis Date   Asthma    Chronic back pain    Diabetes (HCC)    Gout    History of Holter monitoring 2012   for palpitations and dyspnea; no acute findings   Hypertension    Migraine    Past Surgical History:  Procedure Laterality Date   ABDOMINAL HYSTERECTOMY     BREAST BIOPSY Right    benign   BREAST SURGERY     biopsy   CATARACT EXTRACTION W/PHACO Right 02/05/2021   Procedure: CATARACT EXTRACTION PHACO AND INTRAOCULAR LENS PLACEMENT (IOC);  Surgeon: Fabio PierceWrzosek, James, MD;  Location: [facility];  Service: Ophthalmology;  Laterality: Right;  CDE 5.55   CATARACT EXTRACTION W/PHACO Left 03/08/2021   Procedure: CATARACT EXTRACTION PHACO AND INTRAOCULAR LENS PLACEMENT LEFT EYE;  Surgeon: Fabio PierceWrzosek, James, MD;  Location: [facility];  Service: Ophthalmology;  Laterality: Left;  left CDE:13.59   COLONOSCOPY  01/2011   Dr. Jena Gaussourk: anal papilla, normal colon. Next TCS 2022.    ESOPHAGOGASTRODUODENOSCOPY N/A 05/15/2019   Procedure: ESOPHAGOGASTRODUODENOSCOPY (EGD);  Surgeon: Corbin Adeourk, Robert M, MD;  Location: [facility];  Service: Endoscopy;  Laterality: N/A;  11:15am   Patient Active Problem List   Diagnosis Date Noted   Chronic RUQ pain 02/18/2019   Constipation 02/18/2019   GERD (gastroesophageal reflux  disease) 02/18/2019   Mixed incontinence urge and stress 02/16/2017   Pilonidal cyst 02/16/2017   Vaginal pain 02/16/2017   Vaginal odor 02/16/2017   Urinary frequency 02/16/2017   Postmenopausal 02/16/2017   History of hysterectomy 02/16/2017    REFERRING DIAG: M54.50,G89.29 (ICD-10-CM) - Chronic bilateral low back pain, unspecified whether sciatica present   THERAPY DIAG:  Other low back pain   Muscle weakness (generalized)   Other abnormalities of gait and mobility   Other symptoms and signs involving the musculoskeletal system  PERTINENT HISTORY: DM  PRECAUTIONS: None  SUBJECTIVE: Back is doing alright. HEP is going well and helping a bit.  PAIN:  Are you having pain? Yes: NPRS scale: 3/10 Pain location: LBP and Bil knees Pain description: Tender Aggravating factors: moving, can't stand or sit for long periods of time. Relieving factors: sleep   OBJECTIVE: (objective measures completed at initial evaluation unless otherwise dated)    OBJECTIVE:    DIAGNOSTIC FINDINGS:  2/20 XR FINDINGS: No malalignment. No fractures. Multilevel degenerative disc disease, moderate. Facet degenerative changes lumbar spine, particularly the lower lumbar spine. No other abnormalities.  IMPRESSION: Degenerative changes as above.   PATIENT SURVEYS:  FOTO 34% function  PALPATION: TTP lower lumbar paraspinals    LUMBAR ROM: pain with all AROM   Active  A/PROM  04/18/2022  Flexion 25% limited  Extension 75% limited  Right lateral flexion 50% limited  Left lateral flexion 50% limited  Right rotation 50% limited  Left rotation 50% limited   (Blank rows = not tested)     LE MMT:   MMT Right 04/18/2022 Left 04/18/2022  Hip flexion 3+/5 3+/5  Hip extension      Hip abduction      Hip adduction      Hip internal rotation      Hip external rotation      Knee flexion 4-/5 4+/5  Knee extension 4/5 4+/5  Ankle dorsiflexion 4-/5 4+/5  Ankle plantarflexion      Ankle  inversion      Ankle eversion       (Blank rows = not tested)     FUNCTIONAL TESTS:  5 times sit to stand: 28.05 seconds with bilateral UE support on thighs, increasing fatigue, decreased eccentric control Transfers: labored with bilateral UE support   GAIT: Distance walked: 100 feet Assistive device utilized: Single point cane Level of assistance: Modified independence Comments: knee valgus, wide BOS, poor foot clearance, trunk flexed       TODAY'S TREATMENT  04/28/22 Marching with ab sets 2 x 10 bilateral  DKTC with heels on green ball 2x 10 5 second holds LTR 10 x 5 second holds Sidelying hip abduction x 10 bilateral  Standing hip abduction 2x 10 bilateral  HR 2 x 10  Trunk flexion stretch - 3 way 5 x 5 second holds each direction 2 sets                            04/25/22                           Standing :  attempted wall arch pt unable to lift heels off floor                                             Standing heel raises at counter x 10                                             Hip excursion x 3                          Sitting :       Good sitting posture x 10                                             Scapular retraction x 10                                             Abdominal set x 10  Sit to stand x 5     Supine:                                   Bridge x 10           Marching with abdominal sets           LTR 5x 10" Side lying:   hip abduction x 10 B 04/20/22 Log rolling Seated: alternating march 10 x 5 second holds bilateral LAQ 10 x 5 second holds bilateral     5STS Supine:  Bridge  Marching with abdominal sets  LTR 5x 10" 196 ft 04/18/22 Seated alternating march 10 x 5 second holds bilateral LAQ 10 x 5 second holds bilateral      PATIENT EDUCATION:  Education details: Patient educated on exam findings, POC, scope of PT, HEP.  Person educated: Patient Education method: Explanation,  Demonstration, and Handouts Education comprehension: verbalized understanding, returned demonstration, verbal cues required, and tactile cues required       HOME EXERCISE PROGRAM:               5/8/23Access Code: [email or web address] - Seated March  - 3 x daily - 7 x weekly - 10 reps - 5 second hold - Seated Long Arc Quad  - 3 x daily - 7 x weekly - 10 reps - 5 second hold 5/18 - Standing Hip Abduction with Counter Support  - 1 x daily - 7 x weekly - 2 sets - 10 reps    5/10: bridge and supine bent knee raise with ab set 5/15         Sitting :       Good sitting posture x 10                                             Scapular retraction x 10                                             Abdominal set x 10  ASSESSMENT:   CLINICAL IMPRESSION: Continued with core and LE strengthening which are tolerated well with intermittent c/o back symptoms. Patient with quick fatigue with standing exercise but is able to complete with rest breaks. She requires intermittent cueing for hold times and mechanics with good carry over. Patient will continue to benefit from physical therapy in order to improve function and reduce impairment.    OBJECTIVE IMPAIRMENTS Abnormal gait, decreased activity tolerance, decreased mobility, difficulty walking, decreased ROM, decreased strength, increased muscle spasms, improper body mechanics, postural dysfunction, obesity, and pain.    ACTIVITY LIMITATIONS cleaning, community activity, meal prep, laundry, yard work, and shopping.    PERSONAL FACTORS Fitness, Time since onset of injury/illness/exacerbation, and 3+ comorbidities: chronic back pain, increased BMI, DM, CHF, HTN  are also affecting patient's functional outcome.      REHAB POTENTIAL: Good   CLINICAL DECISION MAKING: Stable/uncomplicated   EVALUATION COMPLEXITY: Low     GOALS: Goals reviewed with patient? No   SHORT TERM GOALS: Target date: 05/09/2022   Patient will be independent with HEP in order to improve  functional outcomes. Baseline:  Goal status: Ongoing  2.  Patient will report at least 25% improvement in symptoms for improved quality of life. Baseline:  Goal status: Ongoing   3.  Patient will be able to transfer to standing without UE support to demonstrate improved LE strength. Baseline: bilateral UE support Goal status: Ongoing     LONG TERM GOALS: Target date: 05/30/2022   Patient will report at least 75% improvement in symptoms for improved quality of life. Baseline:  Goal status: Ongoing   2.  Patient will improve FOTO score by at least 15 points in order to indicate improved tolerance to activity. Baseline: 34% function Goal status: Ongoing   3.  Patient will demonstrate at least 25% improvement in lumbar ROM in all restricted planes for improved ability to move trunk while completing chores. Baseline: see above Goal status: Ongoing   4.  Patient will be able to ambulate at least 250 feet in in order to demonstrate improved tolerance to activity. Baseline:  Goal status: Ongoing   5.  Patient will be able to complete 5x STS in under 11.4 seconds in order to reduce the risk of falls. Baseline: 28.05 seconds Goal status: Ongoing     PLAN: PT FREQUENCY: 2x/week   PT DURATION: 6 weeks   PLANNED INTERVENTIONS: Therapeutic exercises, Therapeutic activity, Neuromuscular re-education, Balance training, Gait training, Patient/Family education, Joint manipulation, Joint mobilization, Stair training, Orthotic/Fit training, DME instructions, Aquatic Therapy, Dry Needling, Electrical stimulation, Spinal manipulation, Spinal mobilization, Cryotherapy, Moist heat, Compression bandaging, scar mobilization, Splintting, Taping, Traction, Ultrasound, Ionotophoresis 4mg /ml Dexamethasone, and Manual therapy     PLAN FOR NEXT SESSION: functional strength, lumbar mobility, core and hip strength  1:39 PM, 04/28/22 04/30/22 PT, DPT Physical Therapist at [facility]

## 2022-04-30 DIAGNOSIS — E1165 Type 2 diabetes mellitus with hyperglycemia: Secondary | ICD-10-CM | POA: Diagnosis not present

## 2022-04-30 DIAGNOSIS — I10 Essential (primary) hypertension: Secondary | ICD-10-CM | POA: Diagnosis not present

## 2022-05-02 ENCOUNTER — Encounter (HOSPITAL_COMMUNITY): Payer: Self-pay

## 2022-05-02 ENCOUNTER — Ambulatory Visit (HOSPITAL_COMMUNITY): Payer: Medicare Other

## 2022-05-02 DIAGNOSIS — M6281 Muscle weakness (generalized): Secondary | ICD-10-CM | POA: Diagnosis not present

## 2022-05-02 DIAGNOSIS — R2689 Other abnormalities of gait and mobility: Secondary | ICD-10-CM

## 2022-05-02 DIAGNOSIS — R29898 Other symptoms and signs involving the musculoskeletal system: Secondary | ICD-10-CM

## 2022-05-02 DIAGNOSIS — M545 Low back pain, unspecified: Secondary | ICD-10-CM | POA: Diagnosis not present

## 2022-05-02 DIAGNOSIS — G8929 Other chronic pain: Secondary | ICD-10-CM | POA: Diagnosis not present

## 2022-05-02 DIAGNOSIS — M5459 Other low back pain: Secondary | ICD-10-CM | POA: Diagnosis not present

## 2022-05-02 NOTE — Therapy (Signed)
OUTPATIENT PHYSICAL THERAPY TREATMENT NOTE   Patient Name: Brianna Manning MRN: [id number] DOB:Mar 23, 1946, 76 y.o., female Today's Date: 05/02/2022  PCP: Fuller Song MD REFERRING PROVIDER: Oliver Barre, MD   END OF SESSION:   PT End of Session - 05/02/22 0944     Visit Number 5    Number of Visits 12    Date for PT Re-Evaluation 05/30/22    Authorization Type [employer] Medicare (no visit limit, no auth)    Progress Note Due on Visit 10    PT Start Time 0945    PT Stop Time 1025    PT Time Calculation (min) 40 min    Activity Tolerance Patient tolerated treatment well    Behavior During Therapy WFL for tasks assessed/performed             Past Medical History:  Diagnosis Date   Asthma    Chronic back pain    Diabetes (HCC)    Gout    History of Holter monitoring 2012   for palpitations and dyspnea; no acute findings   Hypertension    Migraine    Past Surgical History:  Procedure Laterality Date   ABDOMINAL HYSTERECTOMY     BREAST BIOPSY Right    benign   BREAST SURGERY     biopsy   CATARACT EXTRACTION W/PHACO Right 02/05/2021   Procedure: CATARACT EXTRACTION PHACO AND INTRAOCULAR LENS PLACEMENT (IOC);  Surgeon: [name], MD;  Location: [facility];  Service: Ophthalmology;  Laterality: Right;  CDE 5.55   CATARACT EXTRACTION W/PHACO Left 03/08/2021   Procedure: CATARACT EXTRACTION PHACO AND INTRAOCULAR LENS PLACEMENT LEFT EYE;  Surgeon: [name], MD;  Location: [facility];  Service: Ophthalmology;  Laterality: Left;  left CDE:13.59   COLONOSCOPY  01/2011   Dr. Jena Gauss: anal papilla, normal colon. Next TCS 2022.    ESOPHAGOGASTRODUODENOSCOPY N/A 05/15/2019   Procedure: ESOPHAGOGASTRODUODENOSCOPY (EGD);  Surgeon: Corbin Ade, MD;  Location: [facility];  Service: Endoscopy;  Laterality: N/A;  11:15am   Patient Active Problem List   Diagnosis Date Noted   Chronic RUQ pain 02/18/2019   Constipation 02/18/2019   GERD (gastroesophageal reflux  disease) 02/18/2019   Mixed incontinence urge and stress 02/16/2017   Pilonidal cyst 02/16/2017   Vaginal pain 02/16/2017   Vaginal odor 02/16/2017   Urinary frequency 02/16/2017   Postmenopausal 02/16/2017   History of hysterectomy 02/16/2017    REFERRING DIAG: M54.50,G89.29 (ICD-10-CM) - Chronic bilateral low back pain, unspecified whether sciatica present   THERAPY DIAG:  Other low back pain   Muscle weakness (generalized)   Other abnormalities of gait and mobility   Other symptoms and signs involving the musculoskeletal system  PERTINENT HISTORY: DM  PRECAUTIONS: None  SUBJECTIVE: Back is doing a little bit better.  Patient feels like she can move back a little bit more. Experiencing pain this morning. Patient was preparing fish and was up and down frequently and then had resultant 10/10 back pain.  PAIN:  Are you having pain? Yes: NPRS scale: 5/10 Pain location: LBP and Bil knees Pain description: Tender Aggravating factors: moving, can't stand or sit for long periods of time. Relieving factors: sleep   OBJECTIVE: (objective measures completed at initial evaluation unless otherwise dated)    OBJECTIVE:    DIAGNOSTIC FINDINGS:  2/20 XR FINDINGS: No malalignment. No fractures. Multilevel degenerative disc disease, moderate. Facet degenerative changes lumbar spine, particularly the lower lumbar spine. No other abnormalities.  IMPRESSION: Degenerative changes as above.  PATIENT SURVEYS:  FOTO 34% function  PALPATION: TTP lower lumbar paraspinals    LUMBAR ROM: pain with all AROM   Active  A/PROM  04/18/2022  Flexion 25% limited  Extension 75% limited  Right lateral flexion 50% limited  Left lateral flexion 50% limited  Right rotation 50% limited  Left rotation 50% limited   (Blank rows = not tested)     LE MMT:   MMT Right 04/18/2022 Left 04/18/2022  Hip flexion 3+/5 3+/5  Hip extension      Hip abduction      Hip adduction      Hip internal  rotation      Hip external rotation      Knee flexion 4-/5 4+/5  Knee extension 4/5 4+/5  Ankle dorsiflexion 4-/5 4+/5  Ankle plantarflexion      Ankle inversion      Ankle eversion       (Blank rows = not tested)     FUNCTIONAL TESTS:  5 times sit to stand: 28.05 seconds with bilateral UE support on thighs, increasing fatigue, decreased eccentric control Transfers: labored with bilateral UE support   GAIT: Distance walked: 100 feet Assistive device utilized: Single point cane Level of assistance: Modified independence Comments: knee valgus, wide BOS, poor foot clearance, trunk flexed       TODAY'S TREATMENT   05/02/22  LTR 10 x 5 second holds  DKTC with heels on red ball 2x 10 5 second holds  Contralateral knee to hand touches in supine with uE with red tband pull down x20  Seated trunk flexion to extensions  Paloff press 1 plate W11 b/l   Slowed standing marches in ll bars with cues for core engagement as well as weight shift  Seated side bends with 4# dumbells x20  Seated bicep curl into shoulder press with 4# dumbbell x10 each   Red theraband fly x10  Red theraband shoulder extensions x10  Red theraband mini trunk rotations x10 b/l   Modified cat/camel with UES on table          04/28/22 Marching with ab sets 2 x 10 bilateral  DKTC with heels on green ball 2x 10 5 second holds LTR 10 x 5 second holds Sidelying hip abduction x 10 bilateral  Standing hip abduction 2x 10 bilateral  HR 2 x 10  Trunk flexion stretch - 3 way 5 x 5 second holds each direction 2 sets                            04/25/22                           Standing :  attempted wall arch pt unable to lift heels off floor                                             Standing heel raises at counter x 10                                             Hip excursion x 3  Sitting :       Good sitting posture x 10                                             Scapular retraction x 10                                              Abdominal set x 10                                              Sit to stand x 5     Supine:                                   Bridge x 10           Marching with abdominal sets           LTR 5x 10" Side lying:   hip abduction x 10 B 04/20/22 Log rolling Seated: alternating march 10 x 5 second holds bilateral LAQ 10 x 5 second holds bilateral     5STS Supine:  Bridge  Marching with abdominal sets  LTR 5x 10" 196 ft 04/18/22 Seated alternating march 10 x 5 second holds bilateral LAQ 10 x 5 second holds bilateral      PATIENT EDUCATION:  Education details: Patient educated on exam findings, POC, scope of PT, HEP.  Person educated: Patient Education method: Explanation, Demonstration, and Handouts Education comprehension: verbalized understanding, returned demonstration, verbal cues required, and tactile cues required       HOME EXERCISE PROGRAM:               5/8/23Access Code: [email or web address] - Seated March  - 3 x daily - 7 x weekly - 10 reps - 5 second hold - Seated Long Arc Quad  - 3 x daily - 7 x weekly - 10 reps - 5 second hold 5/18 - Standing Hip Abduction with Counter Support  - 1 x daily - 7 x weekly - 2 sets - 10 reps    5/10: bridge and supine bent knee raise with ab set 5/15         Sitting :       Good sitting posture x 10                                             Scapular retraction x 10                                             Abdominal set x 10  ASSESSMENT:   CLINICAL IMPRESSION: Continued with core and LE strengthening which are tolerated well with intermittent c/o back symptoms and need for seated rests. Pt with increased lateral trunk excursions left and right with each step when walking. Pt able to  stabilize core more during walking but with reported increases in pain. Patient with difficulty creating motion in spine and pelvis with cat/camels. Patient will continue to benefit from glute med strengthening and core  strength and stabilization.    OBJECTIVE IMPAIRMENTS Abnormal gait, decreased activity tolerance, decreased mobility, difficulty walking, decreased ROM, decreased strength, increased muscle spasms, improper body mechanics, postural dysfunction, obesity, and pain.    ACTIVITY LIMITATIONS cleaning, community activity, meal prep, laundry, yard work, and shopping.    PERSONAL FACTORS Fitness, Time since onset of injury/illness/exacerbation, and 3+ comorbidities: chronic back pain, increased BMI, DM, CHF, HTN  are also affecting patient's functional outcome.      REHAB POTENTIAL: Good   CLINICAL DECISION MAKING: Stable/uncomplicated   EVALUATION COMPLEXITY: Low     GOALS: Goals reviewed with patient? No   SHORT TERM GOALS: Target date: 05/09/2022   Patient will be independent with HEP in order to improve functional outcomes. Baseline:  Goal status: Ongoing   2.  Patient will report at least 25% improvement in symptoms for improved quality of life. Baseline:  Goal status: Ongoing   3.  Patient will be able to transfer to standing without UE support to demonstrate improved LE strength. Baseline: bilateral UE support Goal status: Ongoing     LONG TERM GOALS: Target date: 05/30/2022   Patient will report at least 75% improvement in symptoms for improved quality of life. Baseline:  Goal status: Ongoing   2.  Patient will improve FOTO score by at least 15 points in order to indicate improved tolerance to activity. Baseline: 34% function Goal status: Ongoing   3.  Patient will demonstrate at least 25% improvement in lumbar ROM in all restricted planes for improved ability to move trunk while completing chores. Baseline: see above Goal status: Ongoing   4.  Patient will be able to ambulate at least 250 feet in 2MWT in order to demonstrate improved tolerance to activity. Baseline:  Goal status: Ongoing   5.  Patient will be able to complete 5x STS in under 11.4 seconds in order to  reduce the risk of falls. Baseline: 28.05 seconds Goal status: Ongoing     PLAN: PT FREQUENCY: 2x/week   PT DURATION: 6 weeks   PLANNED INTERVENTIONS: Therapeutic exercises, Therapeutic activity, Neuromuscular re-education, Balance training, Gait training, Patient/Family education, Joint manipulation, Joint mobilization, Stair training, Orthotic/Fit training, DME instructions, Aquatic Therapy, Dry Needling, Electrical stimulation, Spinal manipulation, Spinal mobilization, Cryotherapy, Moist heat, Compression bandaging, scar mobilization, Splintting, Taping, Traction, Ultrasound, Ionotophoresis 4mg /ml Dexamethasone, and Manual therapy     PLAN FOR NEXT SESSION: glute med and core strength, continue with cat/camel.   9:45 AM, 05/02/22 Niobe Dick PT, DPT

## 2022-05-04 ENCOUNTER — Ambulatory Visit (HOSPITAL_COMMUNITY): Payer: Medicare Other

## 2022-05-04 ENCOUNTER — Encounter (HOSPITAL_COMMUNITY): Payer: Self-pay

## 2022-05-04 DIAGNOSIS — G8929 Other chronic pain: Secondary | ICD-10-CM | POA: Diagnosis not present

## 2022-05-04 DIAGNOSIS — R2689 Other abnormalities of gait and mobility: Secondary | ICD-10-CM

## 2022-05-04 DIAGNOSIS — M6281 Muscle weakness (generalized): Secondary | ICD-10-CM

## 2022-05-04 DIAGNOSIS — M5459 Other low back pain: Secondary | ICD-10-CM

## 2022-05-04 DIAGNOSIS — R29898 Other symptoms and signs involving the musculoskeletal system: Secondary | ICD-10-CM

## 2022-05-04 DIAGNOSIS — M545 Low back pain, unspecified: Secondary | ICD-10-CM | POA: Diagnosis not present

## 2022-05-04 NOTE — Therapy (Signed)
OUTPATIENT PHYSICAL THERAPY TREATMENT NOTE   Patient Name: Brianna Manning Burstein MRN: [id number] DOB:01/21/1946, 76 Manning.o., female Today's Date: 05/04/2022  PCP: Fuller Songesfaye Desmissie Fanta MD REFERRING PROVIDER: Oliver Barreairns, Mark A, MD   END OF SESSION:   PT End of Session - 05/04/22 1045     Visit Number 6    Number of Visits 12    Date for PT Re-Evaluation 05/30/22    Authorization Type [employer] Medicare (no visit limit, no auth)    Progress Note Due on Visit 10    PT Start Time 1003    PT Stop Time 1047    PT Time Calculation (min) 44 min    Activity Tolerance Patient tolerated treatment well    Behavior During Therapy WFL for tasks assessed/performed              Past Medical History:  Diagnosis Date   Asthma    Chronic back pain    Diabetes (HCC)    Gout    History of Holter monitoring 2012   for palpitations and dyspnea; no acute findings   Hypertension    Migraine    Past Surgical History:  Procedure Laterality Date   ABDOMINAL HYSTERECTOMY     BREAST BIOPSY Right    benign   BREAST SURGERY     biopsy   CATARACT EXTRACTION W/PHACO Right 02/05/2021   Procedure: CATARACT EXTRACTION PHACO AND INTRAOCULAR LENS PLACEMENT (I[facility]);  Surgeon: Fabio PierceWrzosek, James, MD;  Location: [facility];  Service: Ophthalmology;  Laterality: Right;  CDE 5.55   CATARACT EXTRACTION W/PHACO Left 03/08/2021   Procedure: CATARACT EXTRACTION PHACO AND INTRAOCULAR LENS PLACEMENT LEFT EYE;  Surgeon: Fabio PierceWrzosek, James, MD;  Location: [facility];  Service: Ophthalmology;  Laterality: Left;  left CDE:13.59   COLONOSCOPY  01/2011   Dr. Jena Gaussourk: anal papilla, normal colon. Next TCS 2022.    ESOPHAGOGASTRODUODENOSCOPY N/A 05/15/2019   Procedure: ESOPHAGOGASTRODUODENOSCOPY (EGD);  Surgeon: Corbin Adeourk, Robert M, MD;  Location: [facility];  Service: Endoscopy;  Laterality: N/A;  11:15am   Patient Active Problem List   Diagnosis Date Noted   Chronic RUQ pain 02/18/2019   Constipation 02/18/2019   GERD (gastroesophageal reflux  disease) 02/18/2019   Mixed incontinence urge and stress 02/16/2017   Pilonidal cyst 02/16/2017   Vaginal pain 02/16/2017   Vaginal odor 02/16/2017   Urinary frequency 02/16/2017   Postmenopausal 02/16/2017   History of hysterectomy 02/16/2017    REFERRING DIAG: M54.50,G89.29 (ICD-10-CM) - Chronic bilateral low back pain, unspecified whether sciatica present   THERAPY DIAG:  Other low back pain   Muscle weakness (generalized)   Other abnormalities of gait and mobility   Other symptoms and signs involving the musculoskeletal system  PERTINENT HISTORY: DM  PRECAUTIONS: None  SUBJECTIVE: Pt reports ability to stand and do some house work in yard yesterday, felt good to enable ability.  Reports main pain achy pain Bil knees.   PAIN:  Are you having pain? Yes: NPRS scale: 5/10 Pain location: LBP and Bil knees Pain description: Tender, achy Aggravating factors: moving, can't stand or sit for long periods of time. Relieving factors: sleep   OBJECTIVE: (objective measures completed at initial evaluation unless otherwise dated)    OBJECTIVE:    DIAGNOSTIC FINDINGS:  2/20 XR FINDINGS: No malalignment. No fractures. Multilevel degenerative disc disease, moderate. Facet degenerative changes lumbar spine, particularly the lower lumbar spine. No other abnormalities.  IMPRESSION: Degenerative changes as above.   PATIENT SURVEYS:  FOTO 34% function  PALPATION: TTP lower lumbar  paraspinals    LUMBAR ROM: pain with all AROM   Active  A/PROM  04/18/2022  Flexion 25% limited  Extension 75% limited  Right lateral flexion 50% limited  Left lateral flexion 50% limited  Right rotation 50% limited  Left rotation 50% limited   (Blank rows = not tested)     LE MMT:   MMT Right 04/18/2022 Left 04/18/2022  Hip flexion 3+/5 3+/5  Hip extension      Hip abduction      Hip adduction      Hip internal rotation      Hip external rotation      Knee flexion 4-/5 4+/5  Knee  extension 4/5 4+/5  Ankle dorsiflexion 4-/5 4+/5  Ankle plantarflexion      Ankle inversion      Ankle eversion       (Blank rows = not tested)     FUNCTIONAL TESTS:  5 times sit to stand: 28.05 seconds with bilateral UE support on thighs, increasing fatigue, decreased eccentric control Transfers: labored with bilateral UE support   GAIT: Distance walked: 100 feet Assistive device utilized: Single point cane Level of assistance: Modified independence Comments: knee valgus, wide BOS, poor foot clearance, trunk flexed       TODAY'S TREATMENT   05/04/22:  Standing: 3D hip excursion (weight shifting, rotation, STS no HHA, extension pain free range) 2x 5 reps  Slowed standing marches with 1 HHA at counter cues for core engagement as well as weight shift 10x3"  RTB shoulder extension 10x  RTB rows 10x Quadruped: cat/camel 5x 10", verbal/tactile cueing  Supine:  Bridge 10x  LTR 10x 10"  SKTC with towel and other leg extension  05/02/22  LTR 10 x 5 second holds  DKTC with heels on red ball 2x 10 5 second holds  Contralateral knee to hand touches in supine with uE with red tband pull down x20  Seated trunk flexion to extensions  Paloff press 1 plate W29 b/l   Slowed standing marches in ll bars with cues for core engagement as well as weight shift  Seated side bends with 4# dumbells x20  Seated bicep curl into shoulder press with 4# dumbbell x10 each   Red theraband fly x10  Red theraband shoulder extensions x10  Red theraband mini trunk rotations x10 b/l   Modified cat/camel with UES on table          04/28/22 Marching with ab sets 2 x 10 bilateral  DKTC with heels on green ball 2x 10 5 second holds LTR 10 x 5 second holds Sidelying hip abduction x 10 bilateral  Standing hip abduction 2x 10 bilateral  HR 2 x 10  Trunk flexion stretch - 3 way 5 x 5 second holds each direction 2 sets                            04/25/22                           Standing :  attempted  wall arch pt unable to lift heels off floor                                             Standing heel raises at counter x 10  Hip excursion x 3                          Sitting :       Good sitting posture x 10                                             Scapular retraction x 10                                             Abdominal set x 10                                              Sit to stand x 5     Supine:                                   Bridge x 10           Marching with abdominal sets           LTR 5x 10" Side lying:   hip abduction x 10 B 04/20/22 Log rolling Seated: alternating march 10 x 5 second holds bilateral LAQ 10 x 5 second holds bilateral     5STS Supine:  Bridge  Marching with abdominal sets  LTR 5x 10" 196 ft 04/18/22 Seated alternating march 10 x 5 second holds bilateral LAQ 10 x 5 second holds bilateral      PATIENT EDUCATION:  Education details: Patient educated on exam findings, POC, scope of PT, HEP.  Person educated: Patient Education method: Explanation, Demonstration, and Handouts Education comprehension: verbalized understanding, returned demonstration, verbal cues required, and tactile cues required       HOME EXERCISE PROGRAM:               5/8/23Access Code: [email or web address] - Seated March  - 3 x daily - 7 x weekly - 10 reps - 5 second hold - Seated Long Arc Quad  - 3 x daily - 7 x weekly - 10 reps - 5 second hold 5/18 - Standing Hip Abduction with Counter Support  - 1 x daily - 7 x weekly - 2 sets - 10 reps    5/10: bridge and supine bent knee raise with ab set 5/15         Sitting :       Good sitting posture x 10                                             Scapular retraction x 10                                             Abdominal set x 10  ASSESSMENT:   CLINICAL IMPRESSION: Continued session focus with core and LE strengthening which  was tolerated well with intermittent c/o back symptoms  reports with increased standing, reports of pain resolved with seated rest breaks.  Reviewed mechanics with STS for pain control and increased gluteal activation, pt reports knee pain reduced.  Noted anterior pelvic tilt when asked to extend knees during standing exercises, added modified Thomas stretch to address restrictions.  Began cat/camel in quadruped with some verbal and tactile cueing to improve spinal mobility.  EOS pt reports pain reduced to 3/10.   OBJECTIVE IMPAIRMENTS Abnormal gait, decreased activity tolerance, decreased mobility, difficulty walking, decreased ROM, decreased strength, increased muscle spasms, improper body mechanics, postural dysfunction, obesity, and pain.    ACTIVITY LIMITATIONS cleaning, community activity, meal prep, laundry, yard work, and shopping.    PERSONAL FACTORS Fitness, Time since onset of injury/illness/exacerbation, and 3+ comorbidities: chronic back pain, increased BMI, DM, CHF, HTN  are also affecting patient's functional outcome.      REHAB POTENTIAL: Good   CLINICAL DECISION MAKING: Stable/uncomplicated   EVALUATION COMPLEXITY: Low     GOALS: Goals reviewed with patient? No   SHORT TERM GOALS: Target date: 05/09/2022   Patient will be independent with HEP in order to improve functional outcomes. Baseline:  Goal status: Ongoing   2.  Patient will report at least 25% improvement in symptoms for improved quality of life. Baseline:  Goal status: Ongoing   3.  Patient will be able to transfer to standing without UE support to demonstrate improved LE strength. Baseline: bilateral UE support Goal status: Ongoing     LONG TERM GOALS: Target date: 05/30/2022   Patient will report at least 75% improvement in symptoms for improved quality of life. Baseline:  Goal status: Ongoing   2.  Patient will improve FOTO score by at least 15 points in order to indicate improved tolerance to activity. Baseline: 34% function Goal status: Ongoing    3.  Patient will demonstrate at least 25% improvement in lumbar ROM in all restricted planes for improved ability to move trunk while completing chores. Baseline: see above Goal status: Ongoing   4.  Patient will be able to ambulate at least 250 feet in in order to demonstrate improved tolerance to activity. Baseline:  Goal status: Ongoing   5.  Patient will be able to complete 5x STS in under 11.4 seconds in order to reduce the risk of falls. Baseline: 28.05 seconds Goal status: Ongoing     PLAN: PT FREQUENCY: 2x/week   PT DURATION: 6 weeks   PLANNED INTERVENTIONS: Therapeutic exercises, Therapeutic activity, Neuromuscular re-education, Balance training, Gait training, Patient/Family education, Joint manipulation, Joint mobilization, Stair training, Orthotic/Fit training, DME instructions, Aquatic Therapy, Dry Needling, Electrical stimulation, Spinal manipulation, Spinal mobilization, Cryotherapy, Moist heat, Compression bandaging, scar mobilization, Splintting, Taping, Traction, Ultrasound, Ionotophoresis 4mg /ml Dexamethasone, and Manual therapy     PLAN FOR NEXT SESSION: glute med and core strength, continue with cat/camel.   , LPTA/CLT; CBIS [phone]  12:16 PM, 05/04/22

## 2022-05-10 ENCOUNTER — Encounter (HOSPITAL_COMMUNITY): Payer: Self-pay

## 2022-05-10 ENCOUNTER — Ambulatory Visit (HOSPITAL_COMMUNITY): Payer: Medicare Other

## 2022-05-10 DIAGNOSIS — M5459 Other low back pain: Secondary | ICD-10-CM

## 2022-05-10 DIAGNOSIS — M6281 Muscle weakness (generalized): Secondary | ICD-10-CM | POA: Diagnosis not present

## 2022-05-10 DIAGNOSIS — R2689 Other abnormalities of gait and mobility: Secondary | ICD-10-CM

## 2022-05-10 DIAGNOSIS — R29898 Other symptoms and signs involving the musculoskeletal system: Secondary | ICD-10-CM

## 2022-05-10 DIAGNOSIS — M545 Low back pain, unspecified: Secondary | ICD-10-CM | POA: Diagnosis not present

## 2022-05-10 DIAGNOSIS — G8929 Other chronic pain: Secondary | ICD-10-CM | POA: Diagnosis not present

## 2022-05-10 NOTE — Therapy (Signed)
OUTPATIENT PHYSICAL THERAPY TREATMENT NOTE   Patient Name: Brianna Manning MRN: [id number] DOB:[birth year], 76 y.o., female Today's Date: 05/10/2022  PCP: Olene Floss MD REFERRING PROVIDER: Mordecai Rasmussen, MD   END OF SESSION:   PT End of Session - 05/10/22 0940     Visit Number 7    Number of Visits 12    Date for PT Re-Evaluation 05/30/22    Authorization Type [employer] Medicare (no visit limit, no auth)    Progress Note Due on Visit 10    PT Start Time 0945    PT Stop Time 1025    PT Time Calculation (min) 40 min    Activity Tolerance Patient tolerated treatment well    Behavior During Therapy WFL for tasks assessed/performed              Past Medical History:  Diagnosis Date   Asthma    Chronic back pain    Diabetes ([place])    Gout    History of Holter monitoring 2012   for palpitations and dyspnea; no acute findings   Hypertension    Migraine    Past Surgical History:  Procedure Laterality Date   ABDOMINAL HYSTERECTOMY     BREAST BIOPSY Right    benign   BREAST SURGERY     biopsy   CATARACT EXTRACTION W/PHACO Right 02/05/2021   Procedure: CATARACT EXTRACTION PHACO AND INTRAOCULAR LENS PLACEMENT ([place]);  Surgeon: Baruch Goldmann, MD;  Location: [facility];  Service: Ophthalmology;  Laterality: Right;  CDE 5.55   CATARACT EXTRACTION W/PHACO Left 03/08/2021   Procedure: CATARACT EXTRACTION PHACO AND INTRAOCULAR LENS PLACEMENT LEFT EYE;  Surgeon: Baruch Goldmann, MD;  Location: [facility];  Service: Ophthalmology;  Laterality: Left;  left CDE:13.59   COLONOSCOPY  01/2011   Dr. Gala Romney: anal papilla, normal colon. Next TCS 2022.    ESOPHAGOGASTRODUODENOSCOPY N/A 05/15/2019   Procedure: ESOPHAGOGASTRODUODENOSCOPY (EGD);  Surgeon: Daneil Dolin, MD;  Location: [facility];  Service: Endoscopy;  Laterality: N/A;  11:15am   Patient Active Problem List   Diagnosis Date Noted   Chronic RUQ pain 02/18/2019   Constipation 02/18/2019   GERD (gastroesophageal reflux  disease) 02/18/2019   Mixed incontinence urge and stress 02/16/2017   Pilonidal cyst 02/16/2017   Vaginal pain 02/16/2017   Vaginal odor 02/16/2017   Urinary frequency 02/16/2017   Postmenopausal 02/16/2017   History of hysterectomy 02/16/2017    REFERRING DIAG: M54.50,G89.29 (ICD-10-CM) - Chronic bilateral low back pain, unspecified whether sciatica present   THERAPY DIAG:  Other low back pain   Muscle weakness (generalized)   Other abnormalities of gait and mobility   Other symptoms and signs involving the musculoskeletal system  PERTINENT HISTORY: DM  PRECAUTIONS: None  SUBJECTIVE: pt reports she is doing well. Pain in 4/10 in low back reports b/l pian down through legs into ankle.pt reports that knees feel much better today in comparison to lat session.  PAIN:  Are you having pain? Yes: NPRS scale: 4/10 Pain location: LBP and Bil knees Pain description: Tender, achy Aggravating factors: moving, can't stand or sit for long periods of time. Relieving factors: sleep   OBJECTIVE: (objective measures completed at initial evaluation unless otherwise dated)    OBJECTIVE:    DIAGNOSTIC FINDINGS:  2/20 XR FINDINGS: No malalignment. No fractures. Multilevel degenerative disc disease, moderate. Facet degenerative changes lumbar spine, particularly the lower lumbar spine. No other abnormalities.  IMPRESSION: Degenerative changes as above.   PATIENT SURVEYS:  FOTO 34% function  PALPATION: TTP lower lumbar paraspinals    LUMBAR ROM: pain with all AROM   Active  A/PROM  04/18/2022  Flexion 25% limited  Extension 75% limited  Right lateral flexion 50% limited  Left lateral flexion 50% limited  Right rotation 50% limited  Left rotation 50% limited   (Blank rows = not tested)     LE MMT:   MMT Right 04/18/2022 Left 04/18/2022  Hip flexion 3+/5 3+/5  Hip extension      Hip abduction      Hip adduction      Hip internal rotation      Hip external rotation       Knee flexion 4-/5 4+/5  Knee extension 4/5 4+/5  Ankle dorsiflexion 4-/5 4+/5  Ankle plantarflexion      Ankle inversion      Ankle eversion       (Blank rows = not tested)     FUNCTIONAL TESTS:  5 times sit to stand: 28.05 seconds with bilateral UE support on thighs, increasing fatigue, decreased eccentric control Transfers: labored with bilateral UE support   GAIT: Distance walked: 100 feet Assistive device utilized: Single point cane Level of assistance: Modified independence Comments: knee valgus, wide BOS, poor foot clearance, trunk flexed       TODAY'S TREATMENT   05/10/22  LTR x20 with 3 second hold  DKTC x20 with physioball   Prone mini press ups through elbow prop  Prone press up mini push up 2x10 Standing trunk extensions x10  Modified cat/camel with UES on table  Standing hip extensions with green theraband x10 b//l  Side steps along counter with green theraband x5 laps both direction 1 plate palloff press with 3 step outs/in  Standing marches with UE support on counter x20   Leg raises into abd x10 b/l          05/04/22:  Standing: 3D hip excursion (weight shifting, rotation, STS no HHA, extension pain free range) 2x 5 reps  Slowed standing marches with 1 HHA at counter cues for core engagement as well as weight shift 10x3"  RTB shoulder extension 10x  RTB rows 10x Quadruped: cat/camel 5x 10", verbal/tactile cueing  Supine:  Bridge 10x  LTR 10x 10"  SKTC with towel and other leg extension  05/02/22  LTR 10 x 5 second holds  DKTC with heels on red ball 2x 10 5 second holds  Contralateral knee to hand touches in supine with uE with red tband pull down x20  Seated trunk flexion to extensions  Paloff press 1 plate x10 b/l   Slowed standing marches in ll bars with cues for core engagement as well as weight shift  Seated side bends with 4# dumbells x20  Seated bicep curl into shoulder press with 4# dumbbell x10 each   Red theraband fly x10  Red  theraband shoulder extensions x10  Red theraband mini trunk rotations x10 b/l   Modified cat/camel with UES on table          04/28/22 Marching with ab sets 2 x 10 bilateral  DKTC with heels on green ball 2x 10 5 second holds LTR 10 x 5 second holds Sidelying hip abduction x 10 bilateral  Standing hip abduction 2x 10 bilateral  HR 2 x 10  Trunk flexion stretch - 3 way 5 x 5 second holds each direction 2 sets  04/25/22                           Standing :  attempted wall arch pt unable to lift heels off floor                                             Standing heel raises at counter x 10                                             Hip excursion x 3                          Sitting :       Good sitting posture x 10                                             Scapular retraction x 10                                             Abdominal set x 10                                              Sit to stand x 5     Supine:                                   Bridge x 10           Marching with abdominal sets           LTR 5x 10" Side lying:   hip abduction x 10 B 04/20/22 Log rolling Seated: alternating march 10 x 5 second holds bilateral LAQ 10 x 5 second holds bilateral     5STS Supine:  Bridge  Marching with abdominal sets  LTR 5x 10" 2MWT 196 ft 04/18/22 Seated alternating march 10 x 5 second holds bilateral LAQ 10 x 5 second holds bilateral      PATIENT EDUCATION:  Education details: Patient educated on exam findings, POC, scope of PT, HEP.  Person educated: Patient Education method: Explanation, Demonstration, and Handouts Education comprehension: verbalized understanding, returned demonstration, verbal cues required, and tactile cues required       HOME EXERCISE PROGRAM:               5/8/23Access Code: [email or web address] - Seated March  - 3 x daily - 7 x weekly - 10 reps - 5 second hold - Seated Long Arc Quad  - 3 x daily - 7 x weekly - 10 reps - 5  second hold 5/18 - Standing Hip Abduction with Counter Support  - 1 x daily - 7 x weekly - 2 sets - 10 reps    5/10: bridge and supine bent knee raise  with ab set 5/15         Sitting :       Good sitting posture x 10                                             Scapular retraction x 10                                             Abdominal set x 10  ASSESSMENT:   CLINICAL IMPRESSION: Continued session focus with core and LE strengthening which was tolerated well. Added in back extension work with improved symptom report from 4/10 back pain to 3/10 back pain with disappearing symptoms of radiating pain into right thigh. Pt does reports that standing extension was most uncomfortable. Patient reports that she feels like she is able to be on feet for longer at this time prior to back pain starting. Continue with poc to improve strength, reduce pain and increase standing tolerance.    OBJECTIVE IMPAIRMENTS Abnormal gait, decreased activity tolerance, decreased mobility, difficulty walking, decreased ROM, decreased strength, increased muscle spasms, improper body mechanics, postural dysfunction, obesity, and pain.    ACTIVITY LIMITATIONS cleaning, community activity, meal prep, laundry, yard work, and shopping.    PERSONAL FACTORS Fitness, Time since onset of injury/illness/exacerbation, and 3+ comorbidities: chronic back pain, increased BMI, DM, CHF, HTN  are also affecting patient's functional outcome.      REHAB POTENTIAL: Good   CLINICAL DECISION MAKING: Stable/uncomplicated   EVALUATION COMPLEXITY: Low     GOALS: Goals reviewed with patient? No   SHORT TERM GOALS: Target date: 05/09/2022   Patient will be independent with HEP in order to improve functional outcomes. Baseline:  Goal status: Ongoing   2.  Patient will report at least 25% improvement in symptoms for improved quality of life. Baseline:  Goal status: Ongoing   3.  Patient will be able to transfer to standing without UE  support to demonstrate improved LE strength. Baseline: bilateral UE support Goal status: Ongoing     LONG TERM GOALS: Target date: 05/30/2022   Patient will report at least 75% improvement in symptoms for improved quality of life. Baseline:  Goal status: Ongoing   2.  Patient will improve FOTO score by at least 15 points in order to indicate improved tolerance to activity. Baseline: 34% function Goal status: Ongoing   3.  Patient will demonstrate at least 25% improvement in lumbar ROM in all restricted planes for improved ability to move trunk while completing chores. Baseline: see above Goal status: Ongoing   4.  Patient will be able to ambulate at least 250 feet in in order to demonstrate improved tolerance to activity. Baseline:  Goal status: Ongoing   5.  Patient will be able to complete 5x STS in under 11.4 seconds in order to reduce the risk of falls. Baseline: 28.05 seconds Goal status: Ongoing     PLAN: PT FREQUENCY: 2x/week   PT DURATION: 6 weeks   PLANNED INTERVENTIONS: Therapeutic exercises, Therapeutic activity, Neuromuscular re-education, Balance training, Gait training, Patient/Family education, Joint manipulation, Joint mobilization, Stair training, Orthotic/Fit training, DME instructions, Aquatic Therapy, Dry Needling, Electrical stimulation, Spinal manipulation, Spinal mobilization, Cryotherapy, Moist heat, Compression bandaging, scar mobilization, Splintting, Taping, Traction,  Ultrasound, Ionotophoresis 4mg /ml Dexamethasone, and Manual therapy     PLAN FOR NEXT SESSION: glute med and core strength, continue with cat/camel.stick with extension work and increase standing tolerance.   Suzane Vanderweide PT, DPT   9:41 AM, 05/10/22

## 2022-05-12 ENCOUNTER — Ambulatory Visit (HOSPITAL_COMMUNITY): Payer: Medicare Other | Attending: Orthopedic Surgery | Admitting: Physical Therapy

## 2022-05-12 ENCOUNTER — Encounter (HOSPITAL_COMMUNITY): Payer: Self-pay | Admitting: Physical Therapy

## 2022-05-12 DIAGNOSIS — R202 Paresthesia of skin: Secondary | ICD-10-CM | POA: Insufficient documentation

## 2022-05-12 DIAGNOSIS — M5441 Lumbago with sciatica, right side: Secondary | ICD-10-CM | POA: Insufficient documentation

## 2022-05-12 DIAGNOSIS — G8929 Other chronic pain: Secondary | ICD-10-CM | POA: Diagnosis not present

## 2022-05-12 DIAGNOSIS — R200 Anesthesia of skin: Secondary | ICD-10-CM | POA: Diagnosis not present

## 2022-05-12 DIAGNOSIS — M6281 Muscle weakness (generalized): Secondary | ICD-10-CM

## 2022-05-12 DIAGNOSIS — M5459 Other low back pain: Secondary | ICD-10-CM

## 2022-05-12 DIAGNOSIS — R2689 Other abnormalities of gait and mobility: Secondary | ICD-10-CM

## 2022-05-12 DIAGNOSIS — R29898 Other symptoms and signs involving the musculoskeletal system: Secondary | ICD-10-CM

## 2022-05-12 DIAGNOSIS — M545 Low back pain, unspecified: Secondary | ICD-10-CM | POA: Diagnosis present

## 2022-05-12 DIAGNOSIS — M5442 Lumbago with sciatica, left side: Secondary | ICD-10-CM | POA: Insufficient documentation

## 2022-05-12 NOTE — Therapy (Signed)
OUTPATIENT PHYSICAL THERAPY TREATMENT NOTE   Patient Name: Brianna Manning MRN: [id number] DOB:December 31, 1945, 76 y.o., female Today's Date: 05/12/2022  PCP: Fuller Song MD REFERRING PROVIDER: Oliver Barre, MD   END OF SESSION:   PT End of Session - 05/12/22 1041     Visit Number 8    Number of Visits 12    Date for PT Re-Evaluation 05/30/22    Authorization Type [employer] Medicare (no visit limit, no auth)    Progress Note Due on Visit 10    PT Start Time 1042    PT Stop Time 1120    PT Time Calculation (min) 38 min    Activity Tolerance Patient tolerated treatment well    Behavior During Therapy WFL for tasks assessed/performed              Past Medical History:  Diagnosis Date   Asthma    Chronic back pain    Diabetes (HCC)    Gout    History of Holter monitoring 2012   for palpitations and dyspnea; no acute findings   Hypertension    Migraine    Past Surgical History:  Procedure Laterality Date   ABDOMINAL HYSTERECTOMY     BREAST BIOPSY Right    benign   BREAST SURGERY     biopsy   CATARACT EXTRACTION W/PHACO Right 02/05/2021   Procedure: CATARACT EXTRACTION PHACO AND INTRAOCULAR LENS PLACEMENT (IOC);  Surgeon: [name], MD;  Location: [facility];  Service: Ophthalmology;  Laterality: Right;  CDE 5.55   CATARACT EXTRACTION W/PHACO Left 03/08/2021   Procedure: CATARACT EXTRACTION PHACO AND INTRAOCULAR LENS PLACEMENT LEFT EYE;  Surgeon: [name], MD;  Location: [facility];  Service: Ophthalmology;  Laterality: Left;  left CDE:13.59   COLONOSCOPY  01/2011   Dr. Jena Gauss: anal papilla, normal colon. Next TCS 2022.    ESOPHAGOGASTRODUODENOSCOPY N/A 05/15/2019   Procedure: ESOPHAGOGASTRODUODENOSCOPY (EGD);  Surgeon: Corbin Ade, MD;  Location: [facility];  Service: Endoscopy;  Laterality: N/A;  11:15am   Patient Active Problem List   Diagnosis Date Noted   Chronic RUQ pain 02/18/2019   Constipation 02/18/2019   GERD (gastroesophageal reflux  disease) 02/18/2019   Mixed incontinence urge and stress 02/16/2017   Pilonidal cyst 02/16/2017   Vaginal pain 02/16/2017   Vaginal odor 02/16/2017   Urinary frequency 02/16/2017   Postmenopausal 02/16/2017   History of hysterectomy 02/16/2017    REFERRING DIAG: M54.50,G89.29 (ICD-10-CM) - Chronic bilateral low back pain, unspecified whether sciatica present   THERAPY DIAG:  Other low back pain   Muscle weakness (generalized)   Other abnormalities of gait and mobility   Other symptoms and signs involving the musculoskeletal system  PERTINENT HISTORY: DM  PRECAUTIONS: None  SUBJECTIVE: She has been feeling better. Exercises are going alright.  PAIN:  Are you having pain? Yes: NPRS scale: 2-3/10 Pain location: LBP and Bil knees Pain description: Tender, achy Aggravating factors: moving, can't stand or sit for long periods of time. Relieving factors: sleep   OBJECTIVE: (objective measures completed at initial evaluation unless otherwise dated)    OBJECTIVE:    DIAGNOSTIC FINDINGS:  2/20 XR FINDINGS: No malalignment. No fractures. Multilevel degenerative disc disease, moderate. Facet degenerative changes lumbar spine, particularly the lower lumbar spine. No other abnormalities.  IMPRESSION: Degenerative changes as above.   PATIENT SURVEYS:  FOTO 34% function  PALPATION: TTP lower lumbar paraspinals    LUMBAR ROM: pain with all AROM   Active  A/PROM  04/18/2022  Flexion 25% limited  Extension 75% limited  Right lateral flexion 50% limited  Left lateral flexion 50% limited  Right rotation 50% limited  Left rotation 50% limited   (Blank rows = not tested)     LE MMT:   MMT Right 04/18/2022 Left 04/18/2022  Hip flexion 3+/5 3+/5  Hip extension      Hip abduction      Hip adduction      Hip internal rotation      Hip external rotation      Knee flexion 4-/5 4+/5  Knee extension 4/5 4+/5  Ankle dorsiflexion 4-/5 4+/5  Ankle plantarflexion      Ankle  inversion      Ankle eversion       (Blank rows = not tested)     FUNCTIONAL TESTS:  5 times sit to stand: 28.05 seconds with bilateral UE support on thighs, increasing fatigue, decreased eccentric control Transfers: labored with bilateral UE support   GAIT: Distance walked: 100 feet Assistive device utilized: Single point cane Level of assistance: Modified independence Comments: knee valgus, wide BOS, poor foot clearance, trunk flexed       TODAY'S TREATMENT  05/12/22 DKTC 2x 10 with 5 second holds with green physioball Modified cat/camel with UES on table 2x 10 with tactile cueing Standing hip extensions with green theraband at knees 2x10 b//l  Standing hip abduction with green theraband at knees 2x10 b//l  Palof press GTB 2x 10 bilateral      05/10/22  LTR x20 with 3 second hold  DKTC x20 with physioball   Prone mini press ups through elbow prop  Prone press up mini push up 2x10 Standing trunk extensions x10  Modified cat/camel with UES on table  Standing hip extensions with green theraband x10 b//l  Side steps along counter with green theraband x5 laps both direction 1 plate palloff press with 3 step outs/in  Standing marches with UE support on counter x20   Leg raises into abd x10 b/l      05/04/22:  Standing: 3D hip excursion (weight shifting, rotation, STS no HHA, extension pain free range) 2x 5 reps  Slowed standing marches with 1 HHA at counter cues for core engagement as well as weight shift 10x3"  RTB shoulder extension 10x  RTB rows 10x Quadruped: cat/camel 5x 10", verbal/tactile cueing  Supine:  Bridge 10x  LTR 10x 10"  SKTC with towel and other leg extension  05/02/22  LTR 10 x 5 second holds  DKTC with heels on red ball 2x 10 5 second holds  Contralateral knee to hand touches in supine with uE with red tband pull down x20  Seated trunk flexion to extensions  Paloff press 1 plate H20 b/l   Slowed standing marches in ll bars with cues for core  engagement as well as weight shift  Seated side bends with 4# dumbells x20  Seated bicep curl into shoulder press with 4# dumbbell x10 each   Red theraband fly x10  Red theraband shoulder extensions x10  Red theraband mini trunk rotations x10 b/l   Modified cat/camel with UES on table                             PATIENT EDUCATION:  Education details: Patient educated on exam findings, POC, scope of PT, HEP. 6/1 HEP Person educated: Patient Education method: Explanation, Demonstration, and Handouts Education comprehension: verbalized understanding, returned demonstration, verbal cues required,  and tactile cues required       HOME EXERCISE PROGRAM:               5/8/23Access Code: [email or web address] - Seated March  - 3 x daily - 7 x weekly - 10 reps - 5 second hold - Seated Long Arc Quad  - 3 x daily - 7 x weekly - 10 reps - 5 second hold 5/18 - Standing Hip Abduction with Counter Support  - 1 x daily - 7 x weekly - 2 sets - 10 reps 6/1 - Standing Hip Extension with Resistance at Ankles and Counter Support  - 1 x daily - 7 x weekly - 2 sets - 10 reps - Standing Hip Abduction with Resistance at Ankles and Counter Support  - 1 x daily - 7 x weekly - 2 sets - 10 reps     5/10: bridge and supine bent knee raise with ab set 5/15         Sitting :       Good sitting posture x 10                                             Scapular retraction x 10                                             Abdominal set x 10  ASSESSMENT:   CLINICAL IMPRESSION: Continued mobility and core strengthening exercises to begin session. Began resisted hip strengthening which is tolerated well and issued band for HEP. Patient demonstrating improving standing tolerance and spends most of session standing. Notes moderate fatigue at end of session. Patient will continue to benefit from physical therapy in order to improve function and reduce impairment.     OBJECTIVE IMPAIRMENTS Abnormal gait, decreased activity tolerance,  decreased mobility, difficulty walking, decreased ROM, decreased strength, increased muscle spasms, improper body mechanics, postural dysfunction, obesity, and pain.    ACTIVITY LIMITATIONS cleaning, community activity, meal prep, laundry, yard work, and shopping.    PERSONAL FACTORS Fitness, Time since onset of injury/illness/exacerbation, and 3+ comorbidities: chronic back pain, increased BMI, DM, CHF, HTN  are also affecting patient's functional outcome.      REHAB POTENTIAL: Good   CLINICAL DECISION MAKING: Stable/uncomplicated   EVALUATION COMPLEXITY: Low     GOALS: Goals reviewed with patient? Yes   SHORT TERM GOALS: Target date: 05/09/2022   Patient will be independent with HEP in order to improve functional outcomes. Baseline:  Goal status: Ongoing   2.  Patient will report at least 25% improvement in symptoms for improved quality of life. Baseline:  Goal status: Ongoing   3.  Patient will be able to transfer to standing without UE support to demonstrate improved LE strength. Baseline: bilateral UE support Goal status: Ongoing     LONG TERM GOALS: Target date: 05/30/2022   Patient will report at least 75% improvement in symptoms for improved quality of life. Baseline:  Goal status: Ongoing   2.  Patient will improve FOTO score by at least 15 points in order to indicate improved tolerance to activity. Baseline: 34% function Goal status: Ongoing   3.  Patient will demonstrate at least 25% improvement in lumbar ROM in all restricted planes for improved  ability to move trunk while completing chores. Baseline: see above Goal status: Ongoing   4.  Patient will be able to ambulate at least 250 feet in in order to demonstrate improved tolerance to activity. Baseline:  Goal status: Ongoing   5.  Patient will be able to complete 5x STS in under 11.4 seconds in order to reduce the risk of falls. Baseline: 28.05 seconds Goal status: Ongoing     PLAN: PT  FREQUENCY: 2x/week   PT DURATION: 6 weeks   PLANNED INTERVENTIONS: Therapeutic exercises, Therapeutic activity, Neuromuscular re-education, Balance training, Gait training, Patient/Family education, Joint manipulation, Joint mobilization, Stair training, Orthotic/Fit training, DME instructions, Aquatic Therapy, Dry Needling, Electrical stimulation, Spinal manipulation, Spinal mobilization, Cryotherapy, Moist heat, Compression bandaging, scar mobilization, Splintting, Taping, Traction, Ultrasound, Ionotophoresis /ml Dexamethasone, and Manual therapy     PLAN FOR NEXT SESSION: glute med and core strength, continue with cat/[email or web address].stick with extension work and increase standing tolerance.   10:42 AM, 05/12/22 Wyman Songster PT, DPT Physical Therapist at [facility]

## 2022-05-17 ENCOUNTER — Ambulatory Visit (HOSPITAL_COMMUNITY): Payer: Medicare Other | Attending: Orthopedic Surgery

## 2022-05-17 ENCOUNTER — Encounter (HOSPITAL_COMMUNITY): Payer: Self-pay

## 2022-05-17 DIAGNOSIS — R2689 Other abnormalities of gait and mobility: Secondary | ICD-10-CM | POA: Insufficient documentation

## 2022-05-17 DIAGNOSIS — M6281 Muscle weakness (generalized): Secondary | ICD-10-CM | POA: Diagnosis not present

## 2022-05-17 DIAGNOSIS — M5459 Other low back pain: Secondary | ICD-10-CM | POA: Diagnosis not present

## 2022-05-17 DIAGNOSIS — R29898 Other symptoms and signs involving the musculoskeletal system: Secondary | ICD-10-CM

## 2022-05-17 NOTE — Therapy (Addendum)
OUTPATIENT PHYSICAL THERAPY TREATMENT NOTE   Patient Name: Brianna Manning MRN: [id number] DOB:07-30-1946, 76 y.o., female Today's Date: 05/20/2022  PCP: Fuller Song MD REFERRING PROVIDER: Oliver Barre, MD    05/17/22 0913  PT Visits / Re-Eval  Visit Number 9  Number of Visits 12  Date for PT Re-Evaluation 05/30/22  Authorization  Authorization Type [employer] Medicare (no visit limit, no auth)  Progress Note Due on Visit 10  PT Time Calculation  PT Start Time 0905  PT Stop Time 0947  PT Time Calculation (min) 42 min  PT - End of Session  Activity Tolerance Patient tolerated treatment well  Behavior During Therapy WFL for tasks assessed/performed     Past Medical History:  Diagnosis Date   Asthma    Chronic back pain    Diabetes (HCC)    Gout    History of Holter monitoring 2012   for palpitations and dyspnea; no acute findings   Hypertension    Migraine    Past Surgical History:  Procedure Laterality Date   ABDOMINAL HYSTERECTOMY     BREAST BIOPSY Right    benign   BREAST SURGERY     biopsy   CATARACT EXTRACTION W/PHACO Right 02/05/2021   Procedure: CATARACT EXTRACTION PHACO AND INTRAOCULAR LENS PLACEMENT (IOC);  Surgeon: [name], MD;  Location: [facility];  Service: Ophthalmology;  Laterality: Right;  CDE 5.55   CATARACT EXTRACTION W/PHACO Left 03/08/2021   Procedure: CATARACT EXTRACTION PHACO AND INTRAOCULAR LENS PLACEMENT LEFT EYE;  Surgeon: [name], MD;  Location: [facility];  Service: Ophthalmology;  Laterality: Left;  left CDE:13.59   COLONOSCOPY  01/2011   Dr. Jena Gauss: anal papilla, normal colon. Next TCS 2022.    ESOPHAGOGASTRODUODENOSCOPY N/A 05/15/2019   Procedure: ESOPHAGOGASTRODUODENOSCOPY (EGD);  Surgeon: Corbin Ade, MD;  Location: [facility];  Service: Endoscopy;  Laterality: N/A;  11:15am   Patient Active Problem List   Diagnosis Date Noted   Chronic RUQ pain 02/18/2019   Constipation 02/18/2019   GERD (gastroesophageal  reflux disease) 02/18/2019   Mixed incontinence urge and stress 02/16/2017   Pilonidal cyst 02/16/2017   Vaginal pain 02/16/2017   Vaginal odor 02/16/2017   Urinary frequency 02/16/2017   Postmenopausal 02/16/2017   History of hysterectomy 02/16/2017    REFERRING DIAG: M54.50,G89.29 (ICD-10-CM) - Chronic bilateral low back pain, unspecified whether sciatica present   THERAPY DIAG:  Other low back pain   Muscle weakness (generalized)   Other abnormalities of gait and mobility   Other symptoms and signs involving the musculoskeletal system  PERTINENT HISTORY: DM  PRECAUTIONS: None  SUBJECTIVE: Pt stated she has some achey pain in Rt hip, 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  Lt knee cracks but not painful.  Reports her back is feeling good today.   PAIN:  Are you having pain? Yes: NPRS scale: 3/10 Pain location: Rt hip Pain description:  achy Aggravating factors: moving, can't stand or sit for long periods of time. Relieving factors: sleep   OBJECTIVE: (objective measures completed at initial evaluation unless otherwise dated)    OBJECTIVE:    DIAGNOSTIC FINDINGS:  2/20 XR FINDINGS: No malalignment. No fractures. Multilevel degenerative disc disease, moderate. Facet degenerative changes lumbar spine, particularly the lower lumbar spine. No other abnormalities.  IMPRESSION: Degenerative changes as above.   PATIENT SURVEYS:  FOTO 34% function  PALPATION: TTP lower lumbar paraspinals    LUMBAR ROM: pain with all AROM   Active  A/PROM  04/18/2022  Flexion 25% limited  Extension 75% limited  Right lateral flexion 50% limited  Left lateral flexion 50% limited  Right rotation 50% limited  Left rotation 50% limited   (Blank rows = not tested)     LE MMT:   MMT Right 04/18/2022 Left 04/18/2022  Hip flexion 3+/5 3+/5  Hip extension      Hip abduction      Hip adduction      Hip internal rotation      Hip external rotation      Knee flexion 4-/5 4+/5   Knee extension 4/5 4+/5  Ankle dorsiflexion 4-/5 4+/5  Ankle plantarflexion      Ankle inversion      Ankle eversion       (Blank rows = not tested)     FUNCTIONAL TESTS:  5 times sit to stand: 28.05 seconds with bilateral UE support on thighs, increasing fatigue, decreased eccentric control Transfers: labored with bilateral UE support   GAIT: Distance walked: 100 feet Assistive device utilized: Single point cane Level of assistance: Modified independence Comments: knee valgus, wide BOS, poor foot clearance, trunk flexed       TODAY'S TREATMENT  6/6: Modified cat/camel with UES on table 2x 10 with tactile cueing (cueing to "tuck it in" then stick it out" STS eccentric lowering and power up Squat front of chair for mechanics Proper lifting with 10# box 10x 1 plate palloff press walkout 5RT each direction 2plate retro/forward 5RT Standing extension with GTB postural strengthening 10x March with GTB standing extension 10x 2" difficult with SLS Tandem stance 2x 30"  05/12/22 DKTC 2x 10 with 5 second holds with green physioball Modified cat/camel with UES on table 2x 10 with tactile cueing Standing hip extensions with green theraband at knees 2x10 b//l  Standing hip abduction with green theraband at knees 2x10 b//l  Palof press GTB 2x 10 bilateral      05/10/22  LTR x20 with 3 second hold  DKTC x20 with physioball   Prone mini press ups through elbow prop  Prone press up mini push up 2x10 Standing trunk extensions x10  Modified cat/camel with UES on table  Standing hip extensions with green theraband x10 b//l  Side steps along counter with green theraband x5 laps both direction 1 plate palloff press with 3 step outs/in  Standing marches with UE support on counter x20   Leg raises into abd x10 b/l      05/04/22:  Standing: 3D hip excursion (weight shifting, rotation, STS no HHA, extension pain free range) 2x 5 reps  Slowed standing marches with 1 HHA at counter cues  for core engagement as well as weight shift 10x3"  RTB shoulder extension 10x  RTB rows 10x Quadruped: cat/camel 5x 10", verbal/tactile cueing  Supine:  Bridge 10x  LTR 10x 10"  SKTC with towel and other leg extension  05/02/22  LTR 10 x 5 second holds  DKTC with heels on red ball 2x 10 5 second holds  Contralateral knee to hand touches in supine with uE with red tband pull down x20  Seated trunk flexion to extensions  Paloff press 1 plate W09 b/l   Slowed standing marches in ll bars with cues for core engagement as well as weight shift  Seated side bends with 4# dumbells x20  Seated bicep curl into shoulder press with 4# dumbbell x10 each   Red theraband fly x10  Red theraband shoulder extensions x10  Red theraband mini trunk rotations x10  b/l   Modified cat/camel with UES on table                             PATIENT EDUCATION:  Education details: Patient educated on exam findings, POC, scope of PT, HEP. 6/1 HEP Person educated: Patient Education method: Programmer, multimediaxplanation, Demonstration, and Handouts Education comprehension: verbalized understanding, returned demonstration, verbal cues required, and tactile cues required       HOME EXERCISE PROGRAM:               5/8/23Access Code: [email or web address] - Seated March  - 3 x daily - 7 x weekly - 10 reps - 5 second hold - Seated Long Arc Quad  - 3 x daily - 7 x weekly - 10 reps - 5 second hold 5/18 - Standing Hip Abduction with Counter Support  - 1 x daily - 7 x weekly - 2 sets - 10 reps 6/1 - Standing Hip Extension with Resistance at Ankles and Counter Support  - 1 x daily - 7 x weekly - 2 sets - 10 reps - Standing Hip Abduction with Resistance at Ankles and Counter Support  - 1 x daily - 7 x weekly - 2 sets - 10 reps     5/10: bridge and supine bent knee raise with ab set 5/15         Sitting :       Good sitting posture x 10                                             Scapular retraction x 10                                              Abdominal set x 10   05/17/22: static tandem stance 2x 30" ASSESSMENT:   CLINICAL IMPRESSION: Session focus with mobility and core strengthening exercises.  Pt with difficulty with mobility cat/camel exercise, cueing to "tuck it in" then "stick it out" improved mechanics.  Increased standing activities this session, pt did required seated rest breaks through session due to fatigue.  Added retro gait and other balance activities for core engagement which patients presents with difficulty.  Added static tandem stance to HEP, encouraged to complete front of counter at home for safety with verbalized understanding.   OBJECTIVE IMPAIRMENTS Abnormal gait, decreased activity tolerance, decreased mobility, difficulty walking, decreased ROM, decreased strength, increased muscle spasms, improper body mechanics, postural dysfunction, obesity, and pain.    ACTIVITY LIMITATIONS cleaning, community activity, meal prep, laundry, yard work, and shopping.    PERSONAL FACTORS Fitness, Time since onset of injury/illness/exacerbation, and 3+ comorbidities: chronic back pain, increased BMI, DM, CHF, HTN  are also affecting patient's functional outcome.      REHAB POTENTIAL: Good   CLINICAL DECISION MAKING: Stable/uncomplicated   EVALUATION COMPLEXITY: Low     GOALS: Goals reviewed with patient? Yes   SHORT TERM GOALS: Target date: 05/09/2022   Patient will be independent with HEP in order to improve functional outcomes. Baseline:  Goal status: Ongoing   2.  Patient will report at least 25% improvement in symptoms for improved quality of life. Baseline:  Goal status: Ongoing   3.  Patient  will be able to transfer to standing without UE support to demonstrate improved LE strength. Baseline: bilateral UE support Goal status: Ongoing     LONG TERM GOALS: Target date: 05/30/2022   Patient will report at least 75% improvement in symptoms for improved quality of life. Baseline:  Goal status: Ongoing    2.  Patient will improve FOTO score by at least 15 points in order to indicate improved tolerance to activity. Baseline: 34% function Goal status: Ongoing   3.  Patient will demonstrate at least 25% improvement in lumbar ROM in all restricted planes for improved ability to move trunk while completing chores. Baseline: see above Goal status: Ongoing   4.  Patient will be able to ambulate at least 250 feet in in order to demonstrate improved tolerance to activity. Baseline:  Goal status: Ongoing   5.  Patient will be able to complete 5x STS in under 11.4 seconds in order to reduce the risk of falls. Baseline: 28.05 seconds Goal status: Ongoing     PLAN: PT FREQUENCY: 2x/week   PT DURATION: 6 weeks   PLANNED INTERVENTIONS: Therapeutic exercises, Therapeutic activity, Neuromuscular re-education, Balance training, Gait training, Patient/Family education, Joint manipulation, Joint mobilization, Stair training, Orthotic/Fit training, DME instructions, Aquatic Therapy, Dry Needling, Electrical stimulation, Spinal manipulation, Spinal mobilization, Cryotherapy, Moist heat, Compression bandaging, scar mobilization, Splintting, Taping, Traction, Ultrasound, Ionotophoresis 4mg /ml Dexamethasone, and Manual therapy     PLAN FOR NEXT SESSION:  10th visit progress note next session. glute med and core strength, continue with cat/camel.stick with extension work and increase standing tolerance.  , LPTA/CLT; CBIS [phone]  4:47 PM, 05/20/22

## 2022-05-19 ENCOUNTER — Ambulatory Visit (HOSPITAL_COMMUNITY): Payer: Medicare Other | Attending: Orthopedic Surgery | Admitting: Physical Therapy

## 2022-05-19 ENCOUNTER — Encounter (HOSPITAL_COMMUNITY): Payer: Self-pay | Admitting: Physical Therapy

## 2022-05-19 DIAGNOSIS — M6281 Muscle weakness (generalized): Secondary | ICD-10-CM | POA: Diagnosis not present

## 2022-05-19 DIAGNOSIS — R29898 Other symptoms and signs involving the musculoskeletal system: Secondary | ICD-10-CM | POA: Insufficient documentation

## 2022-05-19 DIAGNOSIS — R2689 Other abnormalities of gait and mobility: Secondary | ICD-10-CM | POA: Insufficient documentation

## 2022-05-19 DIAGNOSIS — M5459 Other low back pain: Secondary | ICD-10-CM | POA: Diagnosis not present

## 2022-05-19 NOTE — Therapy (Signed)
OUTPATIENT PHYSICAL THERAPY TREATMENT NOTE   Patient Name: Brianna Manning MRN: [id number] DOB:Oct 09, 1946, 76 y.o., female Today's Date: 05/19/2022  PCP: Olene Floss MD REFERRING PROVIDER: Mordecai Rasmussen, MD   Progress Note   Reporting Period 04/18/22 to 05/19/22   See note below for Objective Data and Assessment of Progress/Goals    END OF SESSION:   PT End of Session - 05/19/22 0858     Visit Number 10    Number of Visits 12    Date for PT Re-Evaluation 05/30/22    Authorization Type [employer] Medicare (no visit limit, no auth)    Progress Note Due on Visit 20    PT Start Time 0905    PT Stop Time 0945    PT Time Calculation (min) 40 min    Activity Tolerance Patient tolerated treatment well    Behavior During Therapy [facility] for tasks assessed/performed               Past Medical History:  Diagnosis Date   Asthma    Chronic back pain    Diabetes ([place])    Gout    History of Holter monitoring 2012   for palpitations and dyspnea; no acute findings   Hypertension    Migraine    Past Surgical History:  Procedure Laterality Date   ABDOMINAL HYSTERECTOMY     BREAST BIOPSY Right    benign   BREAST SURGERY     biopsy   CATARACT EXTRACTION W/PHACO Right 02/05/2021   Procedure: CATARACT EXTRACTION PHACO AND INTRAOCULAR LENS PLACEMENT ([place]);  Surgeon: Baruch Goldmann, MD;  Location: [facility];  Service: Ophthalmology;  Laterality: Right;  CDE 5.55   CATARACT EXTRACTION W/PHACO Left 03/08/2021   Procedure: CATARACT EXTRACTION PHACO AND INTRAOCULAR LENS PLACEMENT LEFT EYE;  Surgeon: Baruch Goldmann, MD;  Location: [facility];  Service: Ophthalmology;  Laterality: Left;  left CDE:13.59   COLONOSCOPY  01/2011   Dr. Gala Romney: anal papilla, normal colon. Next TCS 2022.    ESOPHAGOGASTRODUODENOSCOPY N/A 05/15/2019   Procedure: ESOPHAGOGASTRODUODENOSCOPY (EGD);  Surgeon: Daneil Dolin, MD;  Location: [facility];  Service: Endoscopy;  Laterality: N/A;  11:15am   Patient Active  Problem List   Diagnosis Date Noted   Chronic RUQ pain 02/18/2019   Constipation 02/18/2019   GERD (gastroesophageal reflux disease) 02/18/2019   Mixed incontinence urge and stress 02/16/2017   Pilonidal cyst 02/16/2017   Vaginal pain 02/16/2017   Vaginal odor 02/16/2017   Urinary frequency 02/16/2017   Postmenopausal 02/16/2017   History of hysterectomy 02/16/2017    REFERRING DIAG: M54.50,G89.29 (ICD-10-CM) - Chronic bilateral low back pain, unspecified whether sciatica present   THERAPY DIAG:  Other low back pain   Muscle weakness (generalized)   Other abnormalities of gait and mobility   Other symptoms and signs involving the musculoskeletal system  PERTINENT HISTORY: DM  PRECAUTIONS: None  SUBJECTIVE: Patient states she has been working on some of her home exercises. Been doing band exercises and some leg strength ones. Patient states 40% improvement since beginning PT. She still has problems standing up and bending her knees.  PAIN:  Are you having pain? Yes: NPRS scale: 4knees 5 back/10 Pain location: b/l knees and back Pain description:  achy Aggravating factors: moving, can't stand or sit for long periods of time. Relieving factors: sleep   OBJECTIVE: (objective measures completed at initial evaluation unless otherwise dated)    OBJECTIVE:    DIAGNOSTIC FINDINGS:  2/20 XR FINDINGS: No malalignment. No  fractures. Multilevel degenerative disc disease, moderate. Facet degenerative changes lumbar spine, particularly the lower lumbar spine. No other abnormalities.  IMPRESSION: Degenerative changes as above.   PATIENT SURVEYS:  FOTO 34% function 05/19/22: 51% function  PALPATION: TTP lower lumbar paraspinals    LUMBAR ROM: pain with all AROM   Active  A/PROM  04/18/2022 AROM 05/19/22  Flexion 25% limited 0% limited  Extension 75% limited 50% limited  Right lateral flexion 50% limited 50% limited  Left lateral flexion 50% limited 50% limited  Right  rotation 50% limited 0% limited  Left rotation 50% limited 0% limited   (Blank rows = not tested)     LE MMT:   MMT Right 04/18/2022 Left 04/18/2022 Right 05/19/22 Left  05/19/22  Hip flexion 3+/5 3+/5 4+/5 4+/5  Hip extension        Hip abduction        Hip adduction        Hip internal rotation        Hip external rotation        Knee flexion 4-/5 4+/5 5/5 5/5  Knee extension 4/5 4+/5 5/5 5/5  Ankle dorsiflexion 4-/5 4+/5 5/5 5/5  Ankle plantarflexion        Ankle inversion        Ankle eversion         (Blank rows = not tested)     FUNCTIONAL TESTS:  5 times sit to stand: 28.05 seconds with bilateral UE support on thighs, increasing fatigue, decreased eccentric control Transfers: labored with bilateral UE support   GAIT: Distance walked: 100 feet Assistive device utilized: Single point cane Level of assistance: Modified independence Comments: knee valgus, wide BOS, poor foot clearance, trunk flexed  Reassessment 05/19/22  5x STS: 22.23 seconds without UE support Transfers: able to perform without UE support Gait:  2MWT - 245 feet without AD, wide BOS, antalgic      TODAY'S TREATMENT  05/19/22 Reassessment STS 2x 10 without UE support Retro gait 3 plates 2x 5 Step up 4 inch 2x 10   2 plate palloff press walkout 5RT each direction  6/6: Modified cat/camel with UES on table 2x 10 with tactile cueing (cueing to "tuck it in" then stick it out" STS eccentric lowering and power up Squat front of chair for mechanics Proper lifting with 10# box 10x 1 plate palloff press walkout 5RT each direction 2plate retro/forward 5RT Standing extension with GTB postural strengthening 10x March with GTB standing extension 10x 2" difficult with SLS Tandem stance 2x 30"  05/12/22 DKTC 2x 10 with 5 second holds with green physioball Modified cat/camel with UES on table 2x 10 with tactile cueing Standing hip extensions with green theraband at knees 2x10 b//l  Standing hip abduction with  green theraband at knees 2x10 b//l  Palof press GTB 2x 10 bilateral      05/10/22  LTR x20 with 3 second hold  DKTC x20 with physioball   Prone mini press ups through elbow prop  Prone press up mini push up 2x10 Standing trunk extensions x10  Modified cat/camel with UES on table  Standing hip extensions with green theraband x10 b//l  Side steps along counter with green theraband x5 laps both direction 1 plate palloff press with 3 step outs/in  Standing marches with UE support on counter x20   Leg raises into abd x10 b/l  PATIENT EDUCATION:   Education details: Patient educated on exam findings, POC, scope of PT, HEP. 6/1 HEP 05/19/22 reassessment findings, POC, HEP Person educated: Patient Education method: Explanation, Demonstration, and Handouts Education comprehension: verbalized understanding, returned demonstration, verbal cues required, and tactile cues required       HOME EXERCISE PROGRAM:               5/8/23Access Code: [email or web address] - Seated March  - 3 x daily - 7 x weekly - 10 reps - 5 second hold - Seated Long Arc Quad  - 3 x daily - 7 x weekly - 10 reps - 5 second hold 5/18 - Standing Hip Abduction with Counter Support  - 1 x daily - 7 x weekly - 2 sets - 10 reps 6/1 - Standing Hip Extension with Resistance at Ankles and Counter Support  - 1 x daily - 7 x weekly - 2 sets - 10 reps - Standing Hip Abduction with Resistance at Ankles and Counter Support  - 1 x daily - 7 x weekly - 2 sets - 10 reps     5/10: bridge and supine bent knee raise with ab set 5/15         Sitting :       Good sitting posture x 10                                             Scapular retraction x 10                                             Abdominal set x 10   05/17/22: static tandem stance 2x 30"   ASSESSMENT:   CLINICAL IMPRESSION: Patient has met 3/ 3 short term goals and 1/5 long term goals with ability to complete HEP and improvement in symptoms,  strength, functional mobility and activity tolerance. Remaining goals not met due to continued deficit in ROM, strength, gait, balance, and symptoms. Continued with strengthening and endurance training today with increasing resistance as able. She requires bilateral UE support due to functional LE weakness with step ups. Patient will continue to benefit from physical therapy in order to improve function and reduce impairment.    OBJECTIVE IMPAIRMENTS Abnormal gait, decreased activity tolerance, decreased mobility, difficulty walking, decreased ROM, decreased strength, increased muscle spasms, improper body mechanics, postural dysfunction, obesity, and pain.    ACTIVITY LIMITATIONS cleaning, community activity, meal prep, laundry, yard work, and shopping.    PERSONAL FACTORS Fitness, Time since onset of injury/illness/exacerbation, and 3+ comorbidities: chronic back pain, increased BMI, DM, CHF, HTN  are also affecting patient's functional outcome.      REHAB POTENTIAL: Good   CLINICAL DECISION MAKING: Stable/uncomplicated   EVALUATION COMPLEXITY: Low     GOALS: Goals reviewed with patient? Yes   SHORT TERM GOALS: Target date: 05/09/2022   Patient will be independent with HEP in order to improve functional outcomes. Baseline:  Goal status: MET   2.  Patient will report at least 25% improvement in symptoms for improved quality of life. Baseline:  Goal status: MET   3.  Patient will be able to transfer to standing without UE support to demonstrate improved LE strength. Baseline: bilateral UE support 6/8 without UE support Goal  status: MET     LONG TERM GOALS: Target date: 05/30/2022   Patient will report at least 75% improvement in symptoms for improved quality of life. Baseline:  Goal status: Ongoing   2.  Patient will improve FOTO score by at least 15 points in order to indicate improved tolerance to activity. Baseline: 34% function 05/19/22  51% function Goal status: MET   3.   Patient will demonstrate at least 25% improvement in lumbar ROM in all restricted planes for improved ability to move trunk while completing chores. Baseline: see above Goal status: Ongoing   4.  Patient will be able to ambulate at least 250 feet in 2MWT in order to demonstrate improved tolerance to activity. Baseline:  05/19/22 245 feet Goal status: Ongoing   5.  Patient will be able to complete 5x STS in under 11.4 seconds in order to reduce the risk of falls. Baseline: 28.05 seconds 6/8 22.23 seconds without UE support Goal status: Ongoing     PLAN: PT FREQUENCY: 2x/week   PT DURATION: 6 weeks   PLANNED INTERVENTIONS: Therapeutic exercises, Therapeutic activity, Neuromuscular re-education, Balance training, Gait training, Patient/Family education, Joint manipulation, Joint mobilization, Stair training, Orthotic/Fit training, DME instructions, Aquatic Therapy, Dry Needling, Electrical stimulation, Spinal manipulation, Spinal mobilization, Cryotherapy, Moist heat, Compression bandaging, scar mobilization, Splintting, Taping, Traction, Ultrasound, Ionotophoresis 2m/ml Dexamethasone, and Manual therapy     PLAN FOR NEXT SESSION:  glute med and core strength, continue with cat/camel.stick with extension work and increase standing tolerance. Likely will d/c at end of POC  8:58 AM, 05/19/22 AMearl LatinPT, DPT [facility] Therap[facility]

## 2022-05-24 ENCOUNTER — Encounter (HOSPITAL_COMMUNITY): Payer: Self-pay | Admitting: Physical Therapy

## 2022-05-24 ENCOUNTER — Ambulatory Visit (HOSPITAL_COMMUNITY): Payer: Medicare Other | Attending: Orthopedic Surgery | Admitting: Physical Therapy

## 2022-05-24 DIAGNOSIS — R6889 Other general symptoms and signs: Secondary | ICD-10-CM | POA: Diagnosis not present

## 2022-05-24 DIAGNOSIS — M5459 Other low back pain: Secondary | ICD-10-CM | POA: Insufficient documentation

## 2022-05-24 DIAGNOSIS — R2689 Other abnormalities of gait and mobility: Secondary | ICD-10-CM | POA: Diagnosis not present

## 2022-05-24 DIAGNOSIS — M6281 Muscle weakness (generalized): Secondary | ICD-10-CM | POA: Insufficient documentation

## 2022-05-24 DIAGNOSIS — R29898 Other symptoms and signs involving the musculoskeletal system: Secondary | ICD-10-CM

## 2022-05-24 NOTE — Therapy (Signed)
OUTPATIENT PHYSICAL THERAPY TREATMENT NOTE   Patient Name: Brianna Manning MRN: [id number] DOB:[birth year], 76 y.o., female Today's Date: 05/24/2022  PCP: Olene Floss MD REFERRING PROVIDER: Mordecai Rasmussen, MD   Progress Note   Reporting Period 04/18/22 to 05/19/22   See note below for Objective Data and Assessment of Progress/Goals    END OF SESSION:   PT End of Session - 05/24/22 1137     Visit Number 11    Number of Visits 12    Date for PT Re-Evaluation 05/30/22    Authorization Type [employer] Medicare (no visit limit, no auth)    Progress Note Due on Visit 20    PT Start Time 1137    PT Stop Time 1215    PT Time Calculation (min) 38 min    Activity Tolerance Patient tolerated treatment well    Behavior During Therapy WFL for tasks assessed/performed               Past Medical History:  Diagnosis Date   Asthma    Chronic back pain    Diabetes ([place])    Gout    History of Holter monitoring 2012   for palpitations and dyspnea; no acute findings   Hypertension    Migraine    Past Surgical History:  Procedure Laterality Date   ABDOMINAL HYSTERECTOMY     BREAST BIOPSY Right    benign   BREAST SURGERY     biopsy   CATARACT EXTRACTION W/PHACO Right 02/05/2021   Procedure: CATARACT EXTRACTION PHACO AND INTRAOCULAR LENS PLACEMENT ([place]);  Surgeon: Baruch Goldmann, MD;  Location: [facility];  Service: Ophthalmology;  Laterality: Right;  CDE 5.55   CATARACT EXTRACTION W/PHACO Left 03/08/2021   Procedure: CATARACT EXTRACTION PHACO AND INTRAOCULAR LENS PLACEMENT LEFT EYE;  Surgeon: Baruch Goldmann, MD;  Location: [facility];  Service: Ophthalmology;  Laterality: Left;  left CDE:13.59   COLONOSCOPY  01/2011   Dr. Gala Romney: anal papilla, normal colon. Next TCS 2022.    ESOPHAGOGASTRODUODENOSCOPY N/A 05/15/2019   Procedure: ESOPHAGOGASTRODUODENOSCOPY (EGD);  Surgeon: Daneil Dolin, MD;  Location[facility];  Service: Endoscopy;  Laterality: N/A;  11:15am   Patient Active  Problem List   Diagnosis Date Noted   Chronic RUQ pain 02/18/2019   Constipation 02/18/2019   GERD (gastroesophageal reflux disease) 02/18/2019   Mixed incontinence urge and stress 02/16/2017   Pilonidal cyst 02/16/2017   Vaginal pain 02/16/2017   Vaginal odor 02/16/2017   Urinary frequency 02/16/2017   Postmenopausal 02/16/2017   History of hysterectomy 02/16/2017    REFERRING DIAG: M54.50,G89.29 (ICD-10-CM) - Chronic bilateral low back pain, unspecified whether sciatica present   THERAPY DIAG:  Other low back pain   Muscle weakness (generalized)   Other abnormalities of gait and mobility   Other symptoms and signs involving the musculoskeletal system  PERTINENT HISTORY: DM  PRECAUTIONS: None  SUBJECTIVE: a little sore from being active yesterday.  PAIN:  Are you having pain? Yes: NPRS scale: 6/10 Pain location: b/l knees and back Pain description:  achy Aggravating factors: moving, can't stand or sit for long periods of time. Relieving factors: sleep   OBJECTIVE: (objective measures completed at initial evaluation unless otherwise dated)    OBJECTIVE:    DIAGNOSTIC FINDINGS:  2/20 XR FINDINGS: No malalignment. No fractures. Multilevel degenerative disc disease, moderate. Facet degenerative changes lumbar spine, particularly the lower lumbar spine. No other abnormalities.  IMPRESSION: Degenerative changes as above.   PATIENT SURVEYS:  FOTO 34% function  05/19/22: 51% function  PALPATION: TTP lower lumbar paraspinals    LUMBAR ROM: pain with all AROM   Active  A/PROM  04/18/2022 AROM 05/19/22  Flexion 25% limited 0% limited  Extension 75% limited 50% limited  Right lateral flexion 50% limited 50% limited  Left lateral flexion 50% limited 50% limited  Right rotation 50% limited 0% limited  Left rotation 50% limited 0% limited   (Blank rows = not tested)     LE MMT:   MMT Right 04/18/2022 Left 04/18/2022 Right 05/19/22 Left  05/19/22  Hip flexion 3+/5 3+/5  4+/5 4+/5  Hip extension        Hip abduction        Hip adduction        Hip internal rotation        Hip external rotation        Knee flexion 4-/5 4+/5 5/5 5/5  Knee extension 4/5 4+/5 5/5 5/5  Ankle dorsiflexion 4-/5 4+/5 5/5 5/5  Ankle plantarflexion        Ankle inversion        Ankle eversion         (Blank rows = not tested)     FUNCTIONAL TESTS:  5 times sit to stand: 28.05 seconds with bilateral UE support on thighs, increasing fatigue, decreased eccentric control Transfers: labored with bilateral UE support   GAIT: Distance walked: 100 feet Assistive device utilized: Single point cane Level of assistance: Modified independence Comments: knee valgus, wide BOS, poor foot clearance, trunk flexed  Reassessment 05/19/22  5x STS: 22.23 seconds without UE support Transfers: able to perform without UE support Gait:  2MWT - 245 feet without AD, wide BOS, antalgic      TODAY'S TREATMENT  05/24/22 Alternating march 1x 15 bilateral Step up 4 inch 2x 10   STS 3x 10 without UE support 2 plate palloff press walkout 8RT each direction Nustep 5 minutes level 5 at EOS for conditioning  05/19/22 Reassessment STS 2x 10 without UE support Retro gait 3 plates 2x 5 Step up 4 inch 2x 10   2 plate palloff press walkout 5RT each direction  6/6: Modified cat/camel with UES on table 2x 10 with tactile cueing (cueing to "tuck it in" then stick it out" STS eccentric lowering and power up Squat front of chair for mechanics Proper lifting with 10# box 10x 1 plate palloff press walkout 5RT each direction 2plate retro/forward 5RT Standing extension with GTB postural strengthening 10x March with GTB standing extension 10x 2" difficult with SLS Tandem stance 2x 30"  05/12/22 DKTC 2x 10 with 5 second holds with green physioball Modified cat/camel with UES on table 2x 10 with tactile cueing Standing hip extensions with green theraband at knees 2x10 b//l  Standing hip abduction with green  theraband at knees 2x10 b//l  Palof press GTB 2x 10 bilateral      05/10/22  LTR x20 with 3 second hold  DKTC x20 with physioball   Prone mini press ups through elbow prop  Prone press up mini push up 2x10 Standing trunk extensions x10  Modified cat/camel with UES on table  Standing hip extensions with green theraband x10 b//l  Side steps along counter with green theraband x5 laps both direction 1 plate palloff press with 3 step outs/in  Standing marches with UE support on counter x20   Leg raises into abd x10 b/l  PATIENT EDUCATION:   Education details: Patient educated on exam findings, POC, scope of PT, HEP. 6/1 HEP 05/19/22 reassessment findings, POC, HEP Person educated: Patient Education method: Explanation, Demonstration, and Handouts Education comprehension: verbalized understanding, returned demonstration, verbal cues required, and tactile cues required       HOME EXERCISE PROGRAM:               5/8/23Access Code: [email or web address] - Seated March  - 3 x daily - 7 x weekly - 10 reps - 5 second hold - Seated Long Arc Quad  - 3 x daily - 7 x weekly - 10 reps - 5 second hold 5/18 - Standing Hip Abduction with Counter Support  - 1 x daily - 7 x weekly - 2 sets - 10 reps 6/1 - Standing Hip Extension with Resistance at Ankles and Counter Support  - 1 x daily - 7 x weekly - 2 sets - 10 reps - Standing Hip Abduction with Resistance at Ankles and Counter Support  - 1 x daily - 7 x weekly - 2 sets - 10 reps     5/10: bridge and supine bent knee raise with ab set 5/15         Sitting :       Good sitting posture x 10                                             Scapular retraction x 10                                             Abdominal set x 10   05/17/22: static tandem stance 2x 30"   ASSESSMENT:   CLINICAL IMPRESSION: Continued with LE strengthening which is tolerated well. She is demonstrating improving activity tolerance with minimal short standing  rest breaks needed. Able to complete increased reps of previously completed exercises. Notes moderate fatigue at end of standing exercises, began nu step for conditioning and improving activity tolerance. Patient will continue to benefit from physical therapy in order to improve function and reduce impairment.     OBJECTIVE IMPAIRMENTS Abnormal gait, decreased activity tolerance, decreased mobility, difficulty walking, decreased ROM, decreased strength, increased muscle spasms, improper body mechanics, postural dysfunction, obesity, and pain.    ACTIVITY LIMITATIONS cleaning, community activity, meal prep, laundry, yard work, and shopping.    PERSONAL FACTORS Fitness, Time since onset of injury/illness/exacerbation, and 3+ comorbidities: chronic back pain, increased BMI, DM, CHF, HTN  are also affecting patient's functional outcome.      REHAB POTENTIAL: Good   CLINICAL DECISION MAKING: Stable/uncomplicated   EVALUATION COMPLEXITY: Low     GOALS: Goals reviewed with patient? Yes   SHORT TERM GOALS: Target date: 05/09/2022   Patient will be independent with HEP in order to improve functional outcomes. Baseline:  Goal status: MET   2.  Patient will report at least 25% improvement in symptoms for improved quality of life. Baseline:  Goal status: MET   3.  Patient will be able to transfer to standing without UE support to demonstrate improved LE strength. Baseline: bilateral UE support 6/8 without UE support Goal status: MET     LONG TERM GOALS: Target date: 05/30/2022   Patient will report at least 75%  improvement in symptoms for improved quality of life. Baseline:  Goal status: Ongoing   2.  Patient will improve FOTO score by at least 15 points in order to indicate improved tolerance to activity. Baseline: 34% function 05/19/22  51% function Goal status: MET   3.  Patient will demonstrate at least 25% improvement in lumbar ROM in all restricted planes for improved ability to  move trunk while completing chores. Baseline: see above Goal status: Ongoing   4.  Patient will be able to ambulate at least 250 feet in 2MWT in order to demonstrate improved tolerance to activity. Baseline:  05/19/22 245 feet Goal status: Ongoing   5.  Patient will be able to complete 5x STS in under 11.4 seconds in order to reduce the risk of falls. Baseline: 28.05 seconds 6/8 22.23 seconds without UE support Goal status: Ongoing     PLAN: PT FREQUENCY: 2x/week   PT DURATION: 6 weeks   PLANNED INTERVENTIONS: Therapeutic exercises, Therapeutic activity, Neuromuscular re-education, Balance training, Gait training, Patient/Family education, Joint manipulation, Joint mobilization, Stair training, Orthotic/Fit training, DME instructions, Aquatic Therapy, Dry Needling, Electrical stimulation, Spinal manipulation, Spinal mobilization, Cryotherapy, Moist heat, Compression bandaging, scar mobilization, Splintting, Taping, Traction, Ultrasound, Ionotophoresis 29m/ml Dexamethasone, and Manual therapy     PLAN FOR NEXT SESSION:  glute med and core strength, continue with cat/camel.stick with extension work and increase standing tolerance. Likely will d/c at end of POC  11:38 AM, 05/24/22 AMearl LatinPT, DPT Physical Therapist [facility]

## 2022-05-26 ENCOUNTER — Ambulatory Visit (HOSPITAL_COMMUNITY): Payer: Medicare Other | Admitting: Physical Therapy

## 2022-05-26 ENCOUNTER — Encounter (HOSPITAL_COMMUNITY): Payer: Self-pay | Admitting: Physical Therapy

## 2022-05-26 DIAGNOSIS — R29898 Other symptoms and signs involving the musculoskeletal system: Secondary | ICD-10-CM

## 2022-05-26 DIAGNOSIS — M6281 Muscle weakness (generalized): Secondary | ICD-10-CM

## 2022-05-26 DIAGNOSIS — M5459 Other low back pain: Secondary | ICD-10-CM | POA: Diagnosis not present

## 2022-05-26 DIAGNOSIS — R2689 Other abnormalities of gait and mobility: Secondary | ICD-10-CM | POA: Diagnosis not present

## 2022-05-26 NOTE — Therapy (Signed)
OUTPATIENT PHYSICAL THERAPY TREATMENT NOTE   Patient Name: Brianna Manning MRN: [id number] DOB:03/28/1946, 76 y.o., female Today's Date: 05/26/2022  PCP: Olene Floss MD REFERRING PROVIDER: Mordecai Rasmussen, MD   PHYSICAL THERAPY DISCHARGE SUMMARY  Visits from Start of Care: 12  Current functional level related to goals / functional outcomes: See below   Remaining deficits: See below   Education / Equipment: See below   Patient agrees to discharge. Patient goals were partially met. Patient is being discharged due to meeting the stated rehab goals.   END OF SESSION:   PT End of Session - 05/26/22 1054     Visit Number 12    Number of Visits 12    Date for PT Re-Evaluation 05/30/22    Authorization Type [employer] Medicare (no visit limit, no auth)    Progress Note Due on Visit 20    PT Start Time 1054    PT Stop Time 1130    PT Time Calculation (min) 36 min    Activity Tolerance Patient tolerated treatment well    Behavior During Therapy WFL for tasks assessed/performed               Past Medical History:  Diagnosis Date   Asthma    Chronic back pain    Diabetes ([place])    Gout    History of Holter monitoring 2012   for palpitations and dyspnea; no acute findings   Hypertension    Migraine    Past Surgical History:  Procedure Laterality Date   ABDOMINAL HYSTERECTOMY     BREAST BIOPSY Right    benign   BREAST SURGERY     biopsy   CATARACT EXTRACTION W/PHACO Right 02/05/2021   Procedure: CATARACT EXTRACTION PHACO AND INTRAOCULAR LENS PLACEMENT ([place]);  Surgeon: Baruch Goldmann, MD;  Location: [facility];  Service: Ophthalmology;  Laterality: Right;  CDE 5.55   CATARACT EXTRACTION W/PHACO Left 03/08/2021   Procedure: CATARACT EXTRACTION PHACO AND INTRAOCULAR LENS PLACEMENT LEFT EYE;  Surgeon: Baruch Goldmann, MD;  Location: [facility];  Service: Ophthalmology;  Laterality: Left;  left CDE:13.59   COLONOSCOPY  01/2011   Dr. Gala Romney: anal papilla, normal colon.  Next TCS 2022.    ESOPHAGOGASTRODUODENOSCOPY N/A 05/15/2019   Procedure: ESOPHAGOGASTRODUODENOSCOPY (EGD);  Surgeon: Daneil Dolin, MD;  Location: [facility];  Service: Endoscopy;  Laterality: N/A;  11:15am   Patient Active Problem List   Diagnosis Date Noted   Chronic RUQ pain 02/18/2019   Constipation 02/18/2019   GERD (gastroesophageal reflux disease) 02/18/2019   Mixed incontinence urge and stress 02/16/2017   Pilonidal cyst 02/16/2017   Vaginal pain 02/16/2017   Vaginal odor 02/16/2017   Urinary frequency 02/16/2017   Postmenopausal 02/16/2017   History of hysterectomy 02/16/2017    REFERRING DIAG: M54.50,G89.29 (ICD-10-CM) - Chronic bilateral low back pain, unspecified whether sciatica present   THERAPY DIAG:  Other low back pain   Muscle weakness (generalized)   Other abnormalities of gait and mobility   Other symptoms and signs involving the musculoskeletal system  PERTINENT HISTORY: DM  PRECAUTIONS: None  SUBJECTIVE: Patient has been doing HEP. 50-60 % improvement since beginning PT. She feels more limited by her knees with being on her feet too long.  PAIN:  Are you having pain? Yes: NPRS scale: 5/10 Pain location: b/l knees and back Pain description:  achy Aggravating factors: moving, can't stand or sit for long periods of time. Relieving factors: sleep   OBJECTIVE: (objective measures completed at  initial evaluation unless otherwise dated)    OBJECTIVE:    DIAGNOSTIC FINDINGS:  2/20 XR FINDINGS: No malalignment. No fractures. Multilevel degenerative disc disease, moderate. Facet degenerative changes lumbar spine, particularly the lower lumbar spine. No other abnormalities.  IMPRESSION: Degenerative changes as above.   PATIENT SURVEYS:  FOTO 34% function 05/19/22: 51% function  05/26/22 56% function  PALPATION: TTP lower lumbar paraspinals    LUMBAR ROM: pain with all AROM   Active  A/PROM  04/18/2022 AROM 05/19/22 AROM 05/26/22  Flexion 25%  limited 0% limited 0% limited  Extension 75% limited 50% limited 50% limited  Right lateral flexion 50% limited 50% limited 0% limited  Left lateral flexion 50% limited 50% limited 0% limited  Right rotation 50% limited 0% limited 0% limited  Left rotation 50% limited 0% limited 0% limited   (Blank rows = not tested)     LE MMT:   MMT Right 04/18/2022 Left 04/18/2022 Right 05/19/22 Left  05/19/22 Right 05/26/22 Left  05/26/22  Hip flexion 3+/5 3+/5 4+/5 4+/5 5/5 5/5  Hip extension          Hip abduction          Hip adduction          Hip internal rotation          Hip external rotation          Knee flexion 4-/5 4+/5 5/5 5/5 5/5 5/5  Knee extension 4/5 4+/5 5/5 5/5 5/5 5/5  Ankle dorsiflexion 4-/5 4+/5 5/5 5/5 5/5 5/5  Ankle plantarflexion          Ankle inversion          Ankle eversion           (Blank rows = not tested)     FUNCTIONAL TESTS:  5 times sit to stand: 28.05 seconds with bilateral UE support on thighs, increasing fatigue, decreased eccentric control Transfers: labored with bilateral UE support   GAIT: Distance walked: 100 feet Assistive device utilized: Single point cane Level of assistance: Modified independence Comments: knee valgus, wide BOS, poor foot clearance, trunk flexed  Reassessment 05/19/22  5x STS: 22.23 seconds without UE support Transfers: able to perform without UE support Gait:  2MWT - 245 feet without AD, wide BOS, antalgic   Reassessment 05/26/22  5x STS: 12.29 seconds without UE support Transfers: able to perform without UE support Gait:  2MWT - 220 feet without AD, wide BOS, antalgic; R knee/hip pain   TODAY'S TREATMENT  05/26/22 Reassessment Step up 4 inch 2x 10   Lateral step up 4 inch 1x 10 bilateral  Tandem stance 3x 30 second holds bilateral  SLS with vectors 5x 5 second holds bilateral with unilateral UE support  05/24/22 Alternating march 1x 15 bilateral Step up 4 inch 2x 10   STS 3x 10 without UE support 2 plate palloff  press walkout 8RT each direction Nustep 5 minutes level 5 at EOS for conditioning  05/19/22 Reassessment STS 2x 10 without UE support Retro gait 3 plates 2x 5 Step up 4 inch 2x 10   2 plate palloff press walkout 5RT each direction  6/6: Modified cat/camel with UES on table 2x 10 with tactile cueing (cueing to "tuck it in" then stick it out" STS eccentric lowering and power up Squat front of chair for mechanics Proper lifting with 10# box 10x 1 plate palloff press walkout 5RT each direction 2plate retro/forward 5RT Standing extension with GTB postural strengthening 10x March with  GTB standing extension 10x 2" difficult with SLS Tandem stance 2x 30"                           PATIENT EDUCATION:   Education details: Patient educated on exam findings, POC, scope of PT, HEP. 6/1 HEP 05/19/22 reassessment findings, POC, HEP 6/15 reassessment findings, continuing HEP, returning to PT if needed Person educated: Patient Education method: Explanation, Demonstration, and Handouts Education comprehension: verbalized understanding, returned demonstration, verbal cues required, and tactile cues required       HOME EXERCISE PROGRAM:               5/8/23Access Code: [email or web address] - Seated March  - 3 x daily - 7 x weekly - 10 reps - 5 second hold - Seated Long Arc Quad  - 3 x daily - 7 x weekly - 10 reps - 5 second hold 5/18 - Standing Hip Abduction with Counter Support  - 1 x daily - 7 x weekly - 2 sets - 10 reps 6/1 - Standing Hip Extension with Resistance at Ankles and Counter Support  - 1 x daily - 7 x weekly - 2 sets - 10 reps - Standing Hip Abduction with Resistance at Ankles and Counter Support  - 1 x daily - 7 x weekly - 2 sets - 10 reps     5/10: bridge and supine bent knee raise with ab set 5/15         Sitting :       Good sitting posture x 10                                             Scapular retraction x 10                                             Abdominal set x 10   05/17/22:  static tandem stance 2x 30" 6/15- Single Leg Balance with Clock Reach  - 1 x daily - 7 x weekly - 5 reps - 5 second hold  ASSESSMENT:   CLINICAL IMPRESSION: Patient has met 3/ 3 short term goals and 2/5 long term goals with ability to complete HEP and improvement in symptoms, strength, functional mobility and activity tolerance. Remaining goals not met due to continued deficit in strength, gait, balance, and symptoms. Patient with large improvements in strength and power as shown with STS. Continued with LE strengthening today to review HEP. Patient discharged from PT at this time.     OBJECTIVE IMPAIRMENTS Abnormal gait, decreased activity tolerance, decreased mobility, difficulty walking, decreased ROM, decreased strength, increased muscle spasms, improper body mechanics, postural dysfunction, obesity, and pain.    ACTIVITY LIMITATIONS cleaning, community activity, meal prep, laundry, yard work, and shopping.    PERSONAL FACTORS Fitness, Time since onset of injury/illness/exacerbation, and 3+ comorbidities: chronic back pain, increased BMI, DM, CHF, HTN  are also affecting patient's functional outcome.      REHAB POTENTIAL: Good   CLINICAL DECISION MAKING: Stable/uncomplicated   EVALUATION COMPLEXITY: Low     GOALS: Goals reviewed with patient? Yes   SHORT TERM GOALS: Target date: 05/09/2022   Patient will be independent with HEP in order to improve functional outcomes. Baseline:  Goal status: MET   2.  Patient will report at least 25% improvement in symptoms for improved quality of life. Baseline:  Goal status: MET   3.  Patient will be able to transfer to standing without UE support to demonstrate improved LE strength. Baseline: bilateral UE support 6/8 without UE support Goal status: MET     LONG TERM GOALS: Target date: 05/30/2022   Patient will report at least 75% improvement in symptoms for improved quality of life. Baseline:  Goal status: Not met   2.  Patient  will improve FOTO score by at least 15 points in order to indicate improved tolerance to activity. Baseline: 34% function 05/19/22  51% function 6/15 56% function Goal status: MET   3.  Patient will demonstrate at least 25% improvement in lumbar ROM in all restricted planes for improved ability to move trunk while completing chores. Baseline: see above Goal status: MET 4.  Patient will be able to ambulate at least 250 feet in 2MWT in order to demonstrate improved tolerance to activity. Baseline:  05/19/22 245 feet  Goal status: Not Met   5.  Patient will be able to complete 5x STS in under 11.4 seconds in order to reduce the risk of falls. Baseline: 28.05 seconds 6/8 22.23 seconds without UE support 6/15 12.29 seconds without UE use Goal status: Not Met     PLAN: PT FREQUENCY: 2x/week   PT DURATION: 6 weeks   PLANNED INTERVENTIONS: Therapeutic exercises, Therapeutic activity, Neuromuscular re-education, Balance training, Gait training, Patient/Family education, Joint manipulation, Joint mobilization, Stair training, Orthotic/Fit training, DME instructions, Aquatic Therapy, Dry Needling, Electrical stimulation, Spinal manipulation, Spinal mobilization, Cryotherapy, Moist heat, Compression bandaging, scar mobilization, Splintting, Taping, Traction, Ultrasound, Ionotophoresis 43m/ml Dexamethasone, and Manual therapy     PLAN FOR NEXT SESSION:  n/a  10:54 AM, 05/26/22 AMearl LatinPT, DPT Physical Therapist at [facility]

## 2022-05-31 ENCOUNTER — Encounter (HOSPITAL_COMMUNITY): Payer: Medicare Other | Admitting: Physical Therapy

## 2022-05-31 DIAGNOSIS — E1165 Type 2 diabetes mellitus with hyperglycemia: Secondary | ICD-10-CM | POA: Diagnosis not present

## 2022-05-31 DIAGNOSIS — I10 Essential (primary) hypertension: Secondary | ICD-10-CM | POA: Diagnosis not present

## 2022-06-02 ENCOUNTER — Encounter (HOSPITAL_COMMUNITY): Payer: Medicare Other | Admitting: Physical Therapy

## 2022-06-21 DIAGNOSIS — L110 Acquired keratosis follicularis: Secondary | ICD-10-CM | POA: Diagnosis not present

## 2022-06-21 DIAGNOSIS — M79674 Pain in right toe(s): Secondary | ICD-10-CM | POA: Diagnosis not present

## 2022-06-21 DIAGNOSIS — M79671 Pain in right foot: Secondary | ICD-10-CM | POA: Diagnosis not present

## 2022-06-21 DIAGNOSIS — M79675 Pain in left toe(s): Secondary | ICD-10-CM | POA: Diagnosis not present

## 2022-06-21 DIAGNOSIS — M79672 Pain in left foot: Secondary | ICD-10-CM | POA: Diagnosis not present

## 2022-06-21 DIAGNOSIS — E1140 Type 2 diabetes mellitus with diabetic neuropathy, unspecified: Secondary | ICD-10-CM | POA: Diagnosis not present

## 2022-06-21 DIAGNOSIS — I739 Peripheral vascular disease, unspecified: Secondary | ICD-10-CM | POA: Diagnosis not present

## 2022-06-30 DIAGNOSIS — N1832 Chronic kidney disease, stage 3b: Secondary | ICD-10-CM | POA: Diagnosis not present

## 2022-06-30 DIAGNOSIS — E1165 Type 2 diabetes mellitus with hyperglycemia: Secondary | ICD-10-CM | POA: Diagnosis not present

## 2022-07-13 DIAGNOSIS — M5489 Other dorsalgia: Secondary | ICD-10-CM | POA: Diagnosis not present

## 2022-07-13 DIAGNOSIS — E1165 Type 2 diabetes mellitus with hyperglycemia: Secondary | ICD-10-CM | POA: Diagnosis not present

## 2022-07-13 DIAGNOSIS — I10 Essential (primary) hypertension: Secondary | ICD-10-CM | POA: Diagnosis not present

## 2022-07-13 DIAGNOSIS — N1832 Chronic kidney disease, stage 3b: Secondary | ICD-10-CM | POA: Diagnosis not present

## 2022-08-13 DIAGNOSIS — I10 Essential (primary) hypertension: Secondary | ICD-10-CM | POA: Diagnosis not present

## 2022-08-13 DIAGNOSIS — E1165 Type 2 diabetes mellitus with hyperglycemia: Secondary | ICD-10-CM | POA: Diagnosis not present

## 2022-08-25 ENCOUNTER — Other Ambulatory Visit (HOSPITAL_COMMUNITY): Payer: Self-pay | Admitting: Internal Medicine

## 2022-08-25 DIAGNOSIS — Z1231 Encounter for screening mammogram for malignant neoplasm of breast: Secondary | ICD-10-CM

## 2022-09-12 DIAGNOSIS — M5136 Other intervertebral disc degeneration, lumbar region: Secondary | ICD-10-CM | POA: Diagnosis not present

## 2022-09-12 DIAGNOSIS — E1165 Type 2 diabetes mellitus with hyperglycemia: Secondary | ICD-10-CM | POA: Diagnosis not present

## 2022-09-12 DIAGNOSIS — I10 Essential (primary) hypertension: Secondary | ICD-10-CM | POA: Diagnosis not present

## 2022-09-27 DIAGNOSIS — M79672 Pain in left foot: Secondary | ICD-10-CM | POA: Diagnosis not present

## 2022-09-27 DIAGNOSIS — M79674 Pain in right toe(s): Secondary | ICD-10-CM | POA: Diagnosis not present

## 2022-09-27 DIAGNOSIS — M79675 Pain in left toe(s): Secondary | ICD-10-CM | POA: Diagnosis not present

## 2022-09-27 DIAGNOSIS — E1140 Type 2 diabetes mellitus with diabetic neuropathy, unspecified: Secondary | ICD-10-CM | POA: Diagnosis not present

## 2022-09-27 DIAGNOSIS — I739 Peripheral vascular disease, unspecified: Secondary | ICD-10-CM | POA: Diagnosis not present

## 2022-09-27 DIAGNOSIS — L110 Acquired keratosis follicularis: Secondary | ICD-10-CM | POA: Diagnosis not present

## 2022-09-27 DIAGNOSIS — Z23 Encounter for immunization: Secondary | ICD-10-CM | POA: Diagnosis not present

## 2022-09-27 DIAGNOSIS — M79671 Pain in right foot: Secondary | ICD-10-CM | POA: Diagnosis not present

## 2022-10-11 ENCOUNTER — Emergency Department (HOSPITAL_COMMUNITY): Payer: Medicare Other

## 2022-10-11 ENCOUNTER — Other Ambulatory Visit: Payer: Self-pay

## 2022-10-11 ENCOUNTER — Emergency Department (HOSPITAL_COMMUNITY)
Admission: EM | Admit: 2022-10-11 | Discharge: 2022-10-11 | Disposition: A | Discharge status: Home / Self Care | Payer: Medicare Other | Attending: Emergency Medicine | Admitting: Emergency Medicine

## 2022-10-11 ENCOUNTER — Encounter (HOSPITAL_COMMUNITY): Payer: Self-pay | Admitting: Emergency Medicine

## 2022-10-11 DIAGNOSIS — E119 Type 2 diabetes mellitus without complications: Secondary | ICD-10-CM | POA: Insufficient documentation

## 2022-10-11 DIAGNOSIS — J45909 Unspecified asthma, uncomplicated: Secondary | ICD-10-CM | POA: Insufficient documentation

## 2022-10-11 DIAGNOSIS — Z79899 Other long term (current) drug therapy: Secondary | ICD-10-CM | POA: Insufficient documentation

## 2022-10-11 DIAGNOSIS — R42 Dizziness and giddiness: Secondary | ICD-10-CM | POA: Diagnosis not present

## 2022-10-11 DIAGNOSIS — Z20822 Contact with and (suspected) exposure to covid-19: Secondary | ICD-10-CM | POA: Diagnosis not present

## 2022-10-11 DIAGNOSIS — R0789 Other chest pain: Secondary | ICD-10-CM | POA: Diagnosis not present

## 2022-10-11 DIAGNOSIS — I10 Essential (primary) hypertension: Secondary | ICD-10-CM | POA: Insufficient documentation

## 2022-10-11 DIAGNOSIS — Z7984 Long term (current) use of oral hypoglycemic drugs: Secondary | ICD-10-CM | POA: Diagnosis not present

## 2022-10-11 DIAGNOSIS — Z7982 Long term (current) use of aspirin: Secondary | ICD-10-CM | POA: Diagnosis not present

## 2022-10-11 DIAGNOSIS — R079 Chest pain, unspecified: Secondary | ICD-10-CM | POA: Diagnosis not present

## 2022-10-11 LAB — CBC
HCT: 37.8 % (ref 36.0–46.0)
Hemoglobin: 12.6 g/dL (ref 12.0–15.0)
MCH: 32.6 pg (ref 26.0–34.0)
MCHC: 33.3 g/dL (ref 30.0–36.0)
MCV: 97.9 fL (ref 80.0–100.0)
Platelets: 214 10*3/uL (ref 150–400)
RBC: 3.86 MIL/uL — ABNORMAL LOW (ref 3.87–5.11)
RDW: 13 % (ref 11.5–15.5)
WBC: 8 10*3/uL (ref 4.0–10.5)
nRBC: 0 % (ref 0.0–0.2)

## 2022-10-11 LAB — SARS CORONAVIRUS 2 BY RT PCR: SARS Coronavirus 2 by RT PCR: NEGATIVE

## 2022-10-11 LAB — BASIC METABOLIC PANEL
Anion gap: 9 (ref 5–15)
BUN: 25 mg/dL — ABNORMAL HIGH (ref 8–23)
CO2: 25 mmol/L (ref 22–32)
Calcium: 9.5 mg/dL (ref 8.9–10.3)
Chloride: 106 mmol/L (ref 98–111)
Creatinine, Ser: 1.22 mg/dL — ABNORMAL HIGH (ref 0.44–1.00)
GFR, Estimated: 46 mL/min — ABNORMAL LOW (ref >60)
Glucose, Bld: 109 mg/dL — ABNORMAL HIGH (ref 70–99)
Potassium: 3.6 mmol/L (ref 3.5–5.1)
Sodium: 140 mmol/L (ref 135–145)

## 2022-10-11 LAB — CBG MONITORING, ED: Glucose-Capillary: 111 mg/dL — ABNORMAL HIGH (ref 70–99)

## 2022-10-11 LAB — TROPONIN I (HIGH SENSITIVITY): Troponin I (High Sensitivity): 5 ng/L (ref <18)

## 2022-10-11 MED ORDER — SODIUM CHLORIDE 0.9 % IV BOLUS
1000.0000 mL | Freq: Once | INTRAVENOUS | Status: AC
  Administered 2022-10-11: 1000 mL via INTRAVENOUS

## 2022-10-11 MED ORDER — MECLIZINE HCL 12.5 MG PO TABS: 12.5000 mg | ORAL_TABLET | Freq: Three times a day (TID) | ORAL | 0 refills | Status: AC | PRN

## 2022-10-11 MED ORDER — MECLIZINE HCL 12.5 MG PO TABS
25.0000 mg | ORAL_TABLET | Freq: Once | ORAL | Status: AC
  Administered 2022-10-11: 25 mg via ORAL
  Filled 2022-10-11: qty 2

## 2022-10-11 NOTE — ED Notes (Signed)
Pt ambulated. Pt denies dizziness or difficulty walking. Dr. Gilford Raid made aware.

## 2022-10-11 NOTE — ED Triage Notes (Signed)
Pt c/o pain to mid chest and left chest intermittient x 2 weeks. "I think its gas". C/o dizziness started yesterday at  9pm, has been constant. Denies trouble speaking or swallowing. Movement makes dizziness worse. A/o. Ambulatory with cane with steady gait. No neuro deficits noted

## 2022-10-11 NOTE — ED Provider Notes (Signed)
[facility] EMERGENCY DEPARTMENT Provider Note   CSN: [id number] Arrival date & time: 10/11/22  1204     History  Chief Complaint  Patient presents with   Chest Pain   Dizziness    Brianna Manning is a 76 y.o. female.  Pt is a 76 yo female with a pmhx significant for htn, asthma, diabetes, and gout.  Pt has been having intermittent cp for 2 weeks.  She developed dizziness around 9 pm and it was still there this am when she woke up.  Pt said it makes her more dizzy when she moves.  She is able to ambulate.  Pt denies any worsening of sob.  No fever or cough.       Home Medications Prior to Admission medications   Medication Sig Start Date End Date Taking? Authorizing Provider  meclizine (ANTIVERT) 12.5 MG tablet Take 1 tablet (12.5 mg total) by mouth 3 (three) times daily as needed for dizziness. 10/11/22  Yes Isla Pence, MD  acetaminophen (TYLENOL) 650 MG CR tablet Take 1,300 mg by mouth every 8 (eight) hours as needed for pain.    [provider]  albuterol (PROVENTIL HFA;VENTOLIN HFA) 108 (90 Base) MCG/ACT inhaler Inhale 2 puffs into the lungs every 6 (six) hours as needed for wheezing or shortness of breath.    [provider]  Ascorbic Acid (VITAMIN C) 100 MG tablet Take 100 mg by mouth daily.    [provider]  aspirin EC 81 MG tablet Take 81 mg by mouth daily as needed for mild pain or moderate pain.     [provider]  atorvastatin (LIPITOR) 20 MG tablet Take 20 mg by mouth every morning.    [provider]  fluticasone (FLONASE) 50 MCG/ACT nasal spray Place 1 spray into both nostrils daily. 04/10/19   Daleen Bo, MD  gabapentin (NEURONTIN) 100 MG capsule Take 100 mg by mouth at bedtime. 02/26/21   [provider]  hydrochlorothiazide (MICROZIDE) 12.5 MG capsule Take 12.5 mg by mouth daily. 10/21/20   [provider]  losartan (COZAAR) 100 MG tablet Take 100 mg by mouth every morning.    [provider]  lubiprostone (AMITIZA) 8 MCG capsule Take 1 capsule (8 mcg total) by mouth 2 (two) times daily with a meal. 02/18/19   Mahala Menghini, PA-C  metFORMIN (GLUCOPHAGE) 500 MG tablet Take 500 mg by mouth 2 (two) times daily with a meal.    [provider]  metoprolol tartrate (LOPRESSOR) 50 MG tablet TAKE 1 TABLET BY MOUTH TWICE DAILY. Patient taking differently: Take 50 mg by mouth 2 (two) times daily. 02/26/21   Arnoldo Lenis, MD  Omega-3 Fatty Acids (FISH OIL) 1000 MG CAPS Take 1,000 mg by mouth daily.    [provider]  omeprazole (PRILOSEC) 20 MG capsule Take 20 mg by mouth every morning.  08/13/15   [provider]  Vitamin D, Cholecalciferol, 25 MCG (1000 UT) TABS Take 1,000 Units by mouth daily.    [provider]  vitamin E 180 MG (400 UNITS) capsule Take 400 Units by mouth daily.    [provider]      Allergies    Patient has no known allergies.    Review of Systems   Review of Systems  Cardiovascular:  Positive for chest pain.  Neurological:  Positive for dizziness.  All other systems reviewed and are negative.   Physical Exam Updated Vital Signs BP (!) 185/55  Pulse 78   Temp 98.4 F (36.9 C) (Oral)   Resp 18   SpO2 97%  Physical Exam Vitals and nursing note reviewed.  Constitutional:      Appearance: She is well-developed.  HENT:     Head: Normocephalic and atraumatic.  Eyes:     Extraocular Movements: Extraocular movements intact.     Pupils: Pupils are equal, round, and reactive to light.  Cardiovascular:     Rate and Rhythm: Regular rhythm. Bradycardia present.     Heart sounds: Normal heart sounds.  Pulmonary:     Effort: Pulmonary effort is normal.     Breath sounds: Normal breath sounds.  Abdominal:     General: Bowel sounds are normal.     Palpations: Abdomen is soft.  Musculoskeletal:        General: Normal range of motion.     Cervical back: Normal range of motion and neck supple.   Skin:    General: Skin is warm and dry.     Capillary Refill: Capillary refill takes less than 2 seconds.  Neurological:     General: No focal deficit present.     Mental Status: She is alert and oriented to person, place, and time.  Psychiatric:        Mood and Affect: Mood normal.        Behavior: Behavior normal.     ED Results / Procedures / Treatments   Labs (all labs ordered are listed, but only abnormal results are displayed) Labs Reviewed  BASIC METABOLIC PANEL - Abnormal; Notable for the following components:      Result Value   Glucose, Bld 109 (*)    BUN 25 (*)    Creatinine, Ser 1.22 (*)    GFR, Estimated 46 (*)    All other components within normal limits  CBC - Abnormal; Notable for the following components:   RBC 3.86 (*)    All other components within normal limits  CBG MONITORING, ED - Abnormal; Notable for the following components:   Glucose-Capillary 111 (*)    All other components within normal limits  SARS CORONAVIRUS 2 BY RT PCR  TROPONIN I (HIGH SENSITIVITY)    EKG EKG Interpretation  Date/Time:  Tuesday October 11 2022 12:26:30 EDT Ventricular Rate:  59 PR Interval:  172 QRS Duration: 100 QT Interval:  421 QTC Calculation: 417 R Axis:   6 Text Interpretation: Sinus rhythm Anteroseptal infarct, old Baseline wander in lead(s) V6 Since last tracing rate slower Confirmed by Jacalyn Lefevre [id number]) on 10/11/2022 1:08:06 PM  Radiology DG Chest 2 View  Result Date: 10/11/2022 CLINICAL DATA:  Chest pain. EXAM: CHEST - 2 VIEW COMPARISON:  March 10, 2021. FINDINGS: The heart size and mediastinal contours are within normal limits. Both lungs are clear. The visualized skeletal structures are unremarkable. IMPRESSION: No active cardiopulmonary disease. Electronically Signed   By: Lupita Raider M.D.   On: 10/11/2022 12:45    Procedures Procedures    Medications Ordered in ED Medications  sodium chloride 0.9 % bolus 1,000 mL (0 mLs Intravenous  Stopped 10/11/22 1400)  meclizine (ANTIVERT) tablet 25 mg (25 mg Oral Given 10/11/22 1251)    ED Course/ Medical Decision Making/ A&P                           Medical Decision Making Amount and/or Complexity of Data Reviewed Labs: ordered. Radiology: ordered.  This patient presents to the ED for  concern of cp, this involves an extensive number of treatment options, and is a complaint that carries with it a high risk of complications and morbidity.  The differential diagnosis includes cardiac, pulm, gi   Co morbidities that complicate the patient evaluation  htn, asthma, diabetes, and gout   Additional history obtained:  Additional history obtained from epic chart review   Lab Tests:  I Ordered, and personally interpreted labs.  The pertinent results include:  cbc nl; bmp nl, covid neg, trop nl   Imaging Studies ordered:  I ordered imaging studies including cxr  I independently visualized and interpreted imaging which showed  IMPRESSION:  No active cardiopulmonary disease.   I agree with the radiologist interpretation   Cardiac Monitoring:  The patient was maintained on a cardiac monitor.  I personally viewed and interpreted the cardiac monitored which showed an underlying rhythm of: nsr   Medicines ordered and prescription drug management:  I ordered medication including antivert and ivfs  for dizziness and dehydration  Reevaluation of the patient after these medicines showed that the patient improved I have reviewed the patients home medicines and have made adjustments as needed  Critical Interventions:  Symptomatic improvement   Problem List / ED Course:  Vertigo:  sx resolved with antivert and fluids.  She is able to ambulate without any problems.  As sx are gone, I don't think a MRI will be useful.  Dizziness likely BPV.  CP:  atypical.  Ekg and trop nl.  Pt is stable for d/c.  Return if worse.  F/u with pcp.   Reevaluation:  After the  interventions noted above, I reevaluated the patient and found that they have :improved   Social Determinants of Health:  Lives at home   Dispostion:  After consideration of the diagnostic results and the patients response to treatment, I feel that the patent would benefit from discharge with outpatient f/u.          Final Clinical Impression(s) / ED Diagnoses Final diagnoses:  Vertigo  Atypical chest pain    Rx / DC Orders ED Discharge Orders          Ordered    meclizine (ANTIVERT) 12.5 MG tablet  3 times daily PRN        10/11/22 1513              Jacalyn Lefevre, MD 10/11/22 1516

## 2022-10-13 ENCOUNTER — Ambulatory Visit (HOSPITAL_COMMUNITY)
Admission: RE | Admit: 2022-10-13 | Discharge: 2022-10-13 | Disposition: A | Discharge status: Home / Self Care | Payer: Medicare Other | Source: Ambulatory Visit | Attending: Internal Medicine | Admitting: Internal Medicine

## 2022-10-13 DIAGNOSIS — Z1231 Encounter for screening mammogram for malignant neoplasm of breast: Secondary | ICD-10-CM | POA: Insufficient documentation

## 2022-10-13 DIAGNOSIS — E1165 Type 2 diabetes mellitus with hyperglycemia: Secondary | ICD-10-CM | POA: Diagnosis not present

## 2022-10-13 DIAGNOSIS — I10 Essential (primary) hypertension: Secondary | ICD-10-CM | POA: Diagnosis not present

## 2022-10-25 ENCOUNTER — Telehealth: Payer: Self-pay

## 2022-10-25 NOTE — Telephone Encounter (Signed)
     Patient  visit on 10/31  at [facility]  Have you been able to follow up with your primary care physician? Yes   The patient was or was not able to obtain any needed medicine or equipment. Yes   Are there diet recommendations that you are having difficulty following? Na   Patient expresses understanding of discharge instructions and education provided has no other needs at this time. Yes      Lenard Forth [facility] Guide, [facility], Care Management  [phone] [address] [place], [place], [place] [address] Phone: [phone] Email: [email or web address]

## 2022-11-12 DIAGNOSIS — E1165 Type 2 diabetes mellitus with hyperglycemia: Secondary | ICD-10-CM | POA: Diagnosis not present

## 2022-11-12 DIAGNOSIS — I10 Essential (primary) hypertension: Secondary | ICD-10-CM | POA: Diagnosis not present

## 2022-12-05 DIAGNOSIS — R5383 Other fatigue: Secondary | ICD-10-CM | POA: Diagnosis not present

## 2022-12-05 DIAGNOSIS — R519 Headache, unspecified: Secondary | ICD-10-CM | POA: Diagnosis not present

## 2022-12-05 DIAGNOSIS — R531 Weakness: Secondary | ICD-10-CM | POA: Diagnosis not present

## 2022-12-05 DIAGNOSIS — R42 Dizziness and giddiness: Secondary | ICD-10-CM | POA: Diagnosis not present

## 2022-12-13 DIAGNOSIS — I10 Essential (primary) hypertension: Secondary | ICD-10-CM | POA: Diagnosis not present

## 2022-12-13 DIAGNOSIS — E1165 Type 2 diabetes mellitus with hyperglycemia: Secondary | ICD-10-CM | POA: Diagnosis not present

## 2023-01-09 DIAGNOSIS — M79674 Pain in right toe(s): Secondary | ICD-10-CM | POA: Diagnosis not present

## 2023-01-09 DIAGNOSIS — M79671 Pain in right foot: Secondary | ICD-10-CM | POA: Diagnosis not present

## 2023-01-09 DIAGNOSIS — E1140 Type 2 diabetes mellitus with diabetic neuropathy, unspecified: Secondary | ICD-10-CM | POA: Diagnosis not present

## 2023-01-09 DIAGNOSIS — M79675 Pain in left toe(s): Secondary | ICD-10-CM | POA: Diagnosis not present

## 2023-01-09 DIAGNOSIS — I739 Peripheral vascular disease, unspecified: Secondary | ICD-10-CM | POA: Diagnosis not present

## 2023-01-09 DIAGNOSIS — M79672 Pain in left foot: Secondary | ICD-10-CM | POA: Diagnosis not present

## 2023-01-09 DIAGNOSIS — L110 Acquired keratosis follicularis: Secondary | ICD-10-CM | POA: Diagnosis not present

## 2023-01-18 ENCOUNTER — Other Ambulatory Visit (HOSPITAL_COMMUNITY)
Admission: RE | Admit: 2023-01-18 | Discharge: 2023-01-18 | Disposition: A | Discharge status: Home / Self Care | Payer: 59 | Source: Ambulatory Visit | Attending: Internal Medicine | Admitting: Internal Medicine

## 2023-01-18 DIAGNOSIS — N1832 Chronic kidney disease, stage 3b: Secondary | ICD-10-CM | POA: Diagnosis not present

## 2023-01-18 DIAGNOSIS — Z0001 Encounter for general adult medical examination with abnormal findings: Secondary | ICD-10-CM | POA: Diagnosis not present

## 2023-01-18 DIAGNOSIS — E1165 Type 2 diabetes mellitus with hyperglycemia: Secondary | ICD-10-CM | POA: Insufficient documentation

## 2023-01-18 DIAGNOSIS — E7849 Other hyperlipidemia: Secondary | ICD-10-CM | POA: Diagnosis not present

## 2023-01-18 DIAGNOSIS — I10 Essential (primary) hypertension: Secondary | ICD-10-CM | POA: Diagnosis not present

## 2023-01-18 DIAGNOSIS — Z1389 Encounter for screening for other disorder: Secondary | ICD-10-CM | POA: Diagnosis not present

## 2023-01-18 DIAGNOSIS — M1990 Unspecified osteoarthritis, unspecified site: Secondary | ICD-10-CM | POA: Diagnosis not present

## 2023-01-18 DIAGNOSIS — M5136 Other intervertebral disc degeneration, lumbar region: Secondary | ICD-10-CM | POA: Diagnosis not present

## 2023-01-18 LAB — BASIC METABOLIC PANEL
Anion gap: 9 (ref 5–15)
BUN: 20 mg/dL (ref 8–23)
CO2: 25 mmol/L (ref 22–32)
Calcium: 9.2 mg/dL (ref 8.9–10.3)
Chloride: 107 mmol/L (ref 98–111)
Creatinine, Ser: 1.14 mg/dL — ABNORMAL HIGH (ref 0.44–1.00)
GFR, Estimated: 50 mL/min — ABNORMAL LOW (ref >60)
Glucose, Bld: 113 mg/dL — ABNORMAL HIGH (ref 70–99)
Potassium: 3.4 mmol/L — ABNORMAL LOW (ref 3.5–5.1)
Sodium: 141 mmol/L (ref 135–145)

## 2023-01-18 LAB — HEPATIC FUNCTION PANEL
ALT: 12 U/L (ref 0–44)
AST: 20 U/L (ref 15–41)
Albumin: 4.1 g/dL (ref 3.5–5.0)
Alkaline Phosphatase: 87 U/L (ref 38–126)
Bilirubin, Direct: 0.1 mg/dL (ref 0.0–0.2)
Indirect Bilirubin: 0.6 mg/dL (ref 0.3–0.9)
Total Bilirubin: 0.7 mg/dL (ref 0.3–1.2)
Total Protein: 7.2 g/dL (ref 6.5–8.1)

## 2023-01-18 LAB — LIPID PANEL
Cholesterol: 147 mg/dL (ref 0–200)
HDL: 49 mg/dL (ref >40)
LDL Cholesterol: 82 mg/dL (ref 0–99)
Total CHOL/HDL Ratio: 3 RATIO
Triglycerides: 81 mg/dL (ref <150)
VLDL: 16 mg/dL (ref 0–40)

## 2023-01-18 LAB — CBC WITH DIFFERENTIAL/PLATELET
Abs Immature Granulocytes: 0.02 10*3/uL (ref 0.00–0.07)
Basophils Absolute: 0 10*3/uL (ref 0.0–0.1)
Basophils Relative: 1 %
Eosinophils Absolute: 0.3 10*3/uL (ref 0.0–0.5)
Eosinophils Relative: 4 %
HCT: 36.6 % (ref 36.0–46.0)
Hemoglobin: 12 g/dL (ref 12.0–15.0)
Immature Granulocytes: 0 %
Lymphocytes Relative: 31 %
Lymphs Abs: 2.5 10*3/uL (ref 0.7–4.0)
MCH: 32.9 pg (ref 26.0–34.0)
MCHC: 32.8 g/dL (ref 30.0–36.0)
MCV: 100.3 fL — ABNORMAL HIGH (ref 80.0–100.0)
Monocytes Absolute: 0.7 10*3/uL (ref 0.1–1.0)
Monocytes Relative: 9 %
Neutro Abs: 4.6 10*3/uL (ref 1.7–7.7)
Neutrophils Relative %: 55 %
Platelets: 215 10*3/uL (ref 150–400)
RBC: 3.65 MIL/uL — ABNORMAL LOW (ref 3.87–5.11)
RDW: 13.1 % (ref 11.5–15.5)
WBC: 8.2 10*3/uL (ref 4.0–10.5)
nRBC: 0 % (ref 0.0–0.2)

## 2023-01-18 LAB — HEMOGLOBIN A1C
Hgb A1c MFr Bld: 6 % — ABNORMAL HIGH (ref 4.8–5.6)
Mean Plasma Glucose: 125.5 mg/dL

## 2023-01-21 LAB — MICROALBUMIN / CREATININE URINE RATIO
Creatinine, Urine: 94.6 mg/dL
Microalb Creat Ratio: 20 mg/g creat (ref 0–29)
Microalb, Ur: 18.5 ug/mL — ABNORMAL HIGH

## 2023-02-16 DIAGNOSIS — E1165 Type 2 diabetes mellitus with hyperglycemia: Secondary | ICD-10-CM | POA: Diagnosis not present

## 2023-02-16 DIAGNOSIS — I10 Essential (primary) hypertension: Secondary | ICD-10-CM | POA: Diagnosis not present

## 2023-03-19 DIAGNOSIS — E1165 Type 2 diabetes mellitus with hyperglycemia: Secondary | ICD-10-CM | POA: Diagnosis not present

## 2023-03-19 DIAGNOSIS — I10 Essential (primary) hypertension: Secondary | ICD-10-CM | POA: Diagnosis not present

## 2023-03-29 ENCOUNTER — Encounter (HOSPITAL_COMMUNITY): Payer: Self-pay | Admitting: *Deleted

## 2023-03-29 ENCOUNTER — Other Ambulatory Visit: Payer: Self-pay

## 2023-03-29 ENCOUNTER — Emergency Department (HOSPITAL_COMMUNITY): Payer: 59

## 2023-03-29 ENCOUNTER — Emergency Department (HOSPITAL_COMMUNITY)
Admission: EM | Admit: 2023-03-29 | Discharge: 2023-03-29 | Disposition: A | Discharge status: Home / Self Care | Payer: 59 | Attending: Emergency Medicine | Admitting: Emergency Medicine

## 2023-03-29 DIAGNOSIS — J189 Pneumonia, unspecified organism: Secondary | ICD-10-CM | POA: Insufficient documentation

## 2023-03-29 DIAGNOSIS — I10 Essential (primary) hypertension: Secondary | ICD-10-CM

## 2023-03-29 DIAGNOSIS — J45909 Unspecified asthma, uncomplicated: Secondary | ICD-10-CM | POA: Insufficient documentation

## 2023-03-29 DIAGNOSIS — E119 Type 2 diabetes mellitus without complications: Secondary | ICD-10-CM | POA: Insufficient documentation

## 2023-03-29 DIAGNOSIS — R0602 Shortness of breath: Secondary | ICD-10-CM | POA: Diagnosis not present

## 2023-03-29 DIAGNOSIS — Z79899 Other long term (current) drug therapy: Secondary | ICD-10-CM | POA: Insufficient documentation

## 2023-03-29 DIAGNOSIS — Z7982 Long term (current) use of aspirin: Secondary | ICD-10-CM | POA: Diagnosis not present

## 2023-03-29 DIAGNOSIS — R079 Chest pain, unspecified: Secondary | ICD-10-CM | POA: Diagnosis not present

## 2023-03-29 DIAGNOSIS — Z7984 Long term (current) use of oral hypoglycemic drugs: Secondary | ICD-10-CM | POA: Insufficient documentation

## 2023-03-29 LAB — BASIC METABOLIC PANEL
Anion gap: 11 (ref 5–15)
BUN: 18 mg/dL (ref 8–23)
CO2: 22 mmol/L (ref 22–32)
Calcium: 9.1 mg/dL (ref 8.9–10.3)
Chloride: 106 mmol/L (ref 98–111)
Creatinine, Ser: 1.33 mg/dL — ABNORMAL HIGH (ref 0.44–1.00)
GFR, Estimated: 41 mL/min — ABNORMAL LOW (ref >60)
Glucose, Bld: 108 mg/dL — ABNORMAL HIGH (ref 70–99)
Potassium: 3.8 mmol/L (ref 3.5–5.1)
Sodium: 139 mmol/L (ref 135–145)

## 2023-03-29 LAB — CBC
HCT: 32.4 % — ABNORMAL LOW (ref 36.0–46.0)
Hemoglobin: 10.6 g/dL — ABNORMAL LOW (ref 12.0–15.0)
MCH: 32.1 pg (ref 26.0–34.0)
MCHC: 32.7 g/dL (ref 30.0–36.0)
MCV: 98.2 fL (ref 80.0–100.0)
Platelets: 191 10*3/uL (ref 150–400)
RBC: 3.3 MIL/uL — ABNORMAL LOW (ref 3.87–5.11)
RDW: 13.6 % (ref 11.5–15.5)
WBC: 7.3 10*3/uL (ref 4.0–10.5)
nRBC: 0 % (ref 0.0–0.2)

## 2023-03-29 LAB — TROPONIN I (HIGH SENSITIVITY)
Troponin I (High Sensitivity): 13 ng/L (ref <18)
Troponin I (High Sensitivity): 15 ng/L (ref <18)

## 2023-03-29 MED ORDER — DOXYCYCLINE HYCLATE 100 MG PO CAPS: 100.0000 mg | ORAL_CAPSULE | Freq: Two times a day (BID) | ORAL | 0 refills | Status: AC

## 2023-03-29 MED ORDER — HYDROCHLOROTHIAZIDE 12.5 MG PO TABS
12.5000 mg | ORAL_TABLET | Freq: Every day | ORAL | Status: DC
  Administered 2023-03-29: 12.5 mg via ORAL
  Filled 2023-03-29 (×4): qty 1

## 2023-03-29 MED ORDER — METOPROLOL TARTRATE 50 MG PO TABS
50.0000 mg | ORAL_TABLET | Freq: Two times a day (BID) | ORAL | Status: DC
  Administered 2023-03-29: 50 mg via ORAL
  Filled 2023-03-29: qty 1

## 2023-03-29 MED ORDER — LOSARTAN POTASSIUM 25 MG PO TABS
100.0000 mg | ORAL_TABLET | Freq: Every morning | ORAL | Status: DC
  Administered 2023-03-29: 100 mg via ORAL
  Filled 2023-03-29: qty 4

## 2023-03-29 NOTE — ED Provider Notes (Signed)
[place] EMERGENCY DEPARTMENT AT [facility] Provider Note   CSN: [id number] Arrival date & time: 03/29/23  7829     History  Chief Complaint  Patient presents with   Shortness of Breath    Brianna Manning is a 77 y.o. female.   Shortness of Breath Patient presents with chest pain and headache.  Began last night.  States she thought she had a sinus infection because she has had some congestion.  States however she checked her blood pressure and it was elevated.  Has some swelling on her legs.  No nausea or vomiting.  No cough.  Feels mildly short of breath.  No known sick contacts.    Past Medical History:  Diagnosis Date   Asthma    Chronic back pain    Diabetes    Gout    History of Holter monitoring 2012   for palpitations and dyspnea; no acute findings   Hypertension    Migraine     Home Medications Prior to Admission medications   Medication Sig Start Date End Date Taking? Authorizing Provider  acetaminophen (TYLENOL) 650 MG CR tablet Take 1,300 mg by mouth every 8 (eight) hours as needed for pain.   Yes [provider]  albuterol (PROVENTIL HFA;VENTOLIN HFA) 108 (90 Base) MCG/ACT inhaler Inhale 2 puffs into the lungs every 6 (six) hours as needed for wheezing or shortness of breath.   Yes [provider]  aspirin EC 81 MG tablet Take 81 mg by mouth daily as needed for mild pain or moderate pain.    Yes [provider]  atorvastatin (LIPITOR) 20 MG tablet Take 20 mg by mouth every morning.   Yes [provider]  doxycycline (VIBRAMYCIN) 100 MG capsule Take 1 capsule (100 mg total) by mouth 2 (two) times daily. 03/29/23  Yes Benjiman Core, MD  fluticasone-salmeterol (ADVAIR HFA) [phone] MCG/ACT inhaler Inhale 2 puffs into the lungs 2 (two) times daily.   Yes [provider]  gabapentin (NEURONTIN) 100 MG capsule Take 200 mg by mouth at bedtime. 02/26/21  Yes [provider]  hydrochlorothiazide  (MICROZIDE) 12.5 MG capsule Take 12.5 mg by mouth daily. 10/21/20  Yes [provider]  losartan (COZAAR) 100 MG tablet Take 100 mg by mouth every morning.   Yes [provider]  lubiprostone (AMITIZA) 8 MCG capsule Take 1 capsule (8 mcg total) by mouth 2 (two) times daily with a meal. 02/18/19  Yes Tiffany Kocher, PA-C  meclizine (ANTIVERT) 12.5 MG tablet Take 1 tablet (12.5 mg total) by mouth 3 (three) times daily as needed for dizziness. 10/11/22  Yes Jacalyn Lefevre, MD  meloxicam (MOBIC) 7.5 MG tablet Take 7.5 mg by mouth 2 (two) times daily. 03/20/23  Yes [provider]  metFORMIN (GLUCOPHAGE) 500 MG tablet Take 500 mg by mouth 2 (two) times daily with a meal.   Yes [provider]  metoprolol tartrate (LOPRESSOR) 50 MG tablet TAKE 1 TABLET BY MOUTH TWICE DAILY. Patient taking differently: Take 50 mg by mouth 2 (two) times daily. 02/26/21  Yes Branch, Dorothe Pea, MD  Omega-3 Fatty Acids (FISH OIL) 1000 MG CAPS Take 1,000 mg by mouth daily.   Yes [provider]  omeprazole (PRILOSEC) 40 MG capsule Take 40 mg by mouth daily. 03/20/23  Yes [provider]  tiZANidine (ZANAFLEX) 2 MG tablet Take 2 mg by mouth at bedtime. 03/20/23  Yes [provider]  fluticasone (FLONASE) 50 MCG/ACT nasal spray Place 1  spray into both nostrils daily. Patient not taking: Reported on 03/29/2023 04/10/19   [name], MD      Allergies    Patient has no known allergies.    Review of Systems   Review of Systems  Respiratory:  Positive for shortness of breath.     Physical Exam Updated Vital Signs BP (!) 190/67   Pulse 60   Temp 98.1 F (36.7 C) (Oral)   Resp 15   Ht  (1.626 m)   Wt 106.1 kg   SpO2 98%   BMI 40.17 kg/m  Physical Exam Vitals and nursing note reviewed.  Cardiovascular:     Rate and Rhythm: Regular rhythm.  Pulmonary:     Breath sounds: No wheezing, rhonchi or rales.  Chest:     Chest wall: No tenderness.   Abdominal:     Tenderness: There is no abdominal tenderness.  Musculoskeletal:     Right lower leg: No tenderness.     Left lower leg: No tenderness.  Skin:    Capillary Refill: Capillary refill takes less than 2 seconds.  Neurological:     Mental Status: She is alert and oriented to person, place, and time.     ED Results / Procedures / Treatments   Labs (all labs ordered are listed, but only abnormal results are displayed) Labs Reviewed  BASIC METABOLIC PANEL - Abnormal; Notable for the following components:      Result Value   Glucose, Bld 108 (*)    Creatinine, Ser 1.33 (*)    GFR, Estimated 41 (*)    All other components within normal limits  CBC - Abnormal; Notable for the following components:   RBC 3.30 (*)    Hemoglobin 10.6 (*)    HCT 32.4 (*)    All other components within normal limits  TROPONIN I (HIGH SENSITIVITY)  TROPONIN I (HIGH SENSITIVITY)    EKG EKG Interpretation  Date/Time:  Wednesday March 29 2023 09:21:02 EDT Ventricular Rate:  65 PR Interval:  195 QRS Duration: 93 QT Interval:  446 QTC Calculation: 464 R Axis:   -11 Text Interpretation: Sinus rhythm Consider left atrial enlargement Anteroseptal infarct, old Borderline T abnormalities, inferior leads Baseline wander in lead(s) II III aVR aVF No significant change since last tracing Confirmed by Benjiman Core [phone]) on 03/29/2023 9:30:42 AM  Radiology DG Chest Port 1 View  Result Date: 03/29/2023 CLINICAL DATA:  Shortness of breath.  Left-sided chest pain. EXAM: PORTABLE CHEST 1 VIEW COMPARISON:  Two-view chest x-ray 10/11/2022 FINDINGS: The heart size is normal. Mild bibasilar airspace opacities are noted, right greater than left. No definite effusion is present. No edema present. No other focal airspace disease is present. Degenerative changes are again noted in the shoulders bilaterally. IMPRESSION: Mild bibasilar airspace disease, right greater than left. This likely reflects atelectasis.  Infection is not excluded. Electronically Signed   By: Marin Roberts M.D.   On: 03/29/2023 10:13    Procedures Procedures    Medications Ordered in ED Medications  hydrochlorothiazide (HYDRODIURIL) tablet 12.5 mg (12.5 mg Oral Given 03/29/23 1317)  losartan (COZAAR) tablet 100 mg (100 mg Oral Given 03/29/23 1211)  metoprolol tartrate (LOPRESSOR) tablet 50 mg (50 mg Oral Given 03/29/23 1210)    ED Course/ Medical Decision Making/ A&P                             Medical Decision Making Amount and/or Complexity of Data  Reviewed Labs: ordered. Radiology: ordered.  Risk Prescription drug management.   Patient with headache.  Some shortness of breath.  High blood pressure.  Has had sinus type infections recently.  Did take some decongestant but states it was just an over-the-counter medicine and does note exactly what it was.  States she has had heart failure in the past.  Some mild swelling in her legs.  Will get x-ray and basic blood work.  X-ray showed atelectasis versus pneumonia.  Does not appear to be in heart failure.  Did have hypertension.  Creatinine is slightly increased from before.  Had not taken her morning medicines.  Gave medicines here and blood pressure appears to be drawn pending.  However with further questioning patient states she has been coughing with some mild sputum production.  With sputum production will treat for pneumonia.  Appears stable for discharge home however.        Final Clinical Impression(s) / ED Diagnoses Final diagnoses:  Community acquired pneumonia, unspecified laterality  Hypertension, unspecified type    Rx / DC Orders ED Discharge Orders          Ordered    doxycycline (VIBRAMYCIN) 100 MG capsule  2 times daily        03/29/23 1323              Benjiman Core, MD 03/29/23 1327

## 2023-03-29 NOTE — ED Notes (Signed)
Patient denies any chest pain.  States her left hand is bothering her.

## 2023-03-29 NOTE — ED Triage Notes (Signed)
Pt in c/o SOB and L sided CP non radiating onset last night, pt c/o HA, denies N/V/D, pt c/o chronic lower leg edema x 1, pt A&O x4

## 2023-03-29 NOTE — ED Notes (Signed)
Patient denies pain and is resting comfortably. No resp distress

## 2023-04-03 ENCOUNTER — Telehealth: Payer: Self-pay | Admitting: *Deleted

## 2023-04-03 NOTE — Telephone Encounter (Signed)
        Patient  visited [facility]  on 4/17  for treatment    Telephone encounter attempt :  1ST CALL CANNOT BE COMPLETED  Yehuda Mao [facility] [place], Population [facility] [phone] [address] [place] , [place] [address] Email : [email or web address]

## 2023-04-05 DIAGNOSIS — I10 Essential (primary) hypertension: Secondary | ICD-10-CM | POA: Diagnosis not present

## 2023-04-05 DIAGNOSIS — J4550 Severe persistent asthma, uncomplicated: Secondary | ICD-10-CM | POA: Diagnosis not present

## 2023-04-11 DIAGNOSIS — I10 Essential (primary) hypertension: Secondary | ICD-10-CM | POA: Diagnosis not present

## 2023-04-17 DIAGNOSIS — M79674 Pain in right toe(s): Secondary | ICD-10-CM | POA: Diagnosis not present

## 2023-04-17 DIAGNOSIS — M79671 Pain in right foot: Secondary | ICD-10-CM | POA: Diagnosis not present

## 2023-04-17 DIAGNOSIS — E1140 Type 2 diabetes mellitus with diabetic neuropathy, unspecified: Secondary | ICD-10-CM | POA: Diagnosis not present

## 2023-04-17 DIAGNOSIS — M79675 Pain in left toe(s): Secondary | ICD-10-CM | POA: Diagnosis not present

## 2023-04-17 DIAGNOSIS — M79672 Pain in left foot: Secondary | ICD-10-CM | POA: Diagnosis not present

## 2023-04-17 DIAGNOSIS — L110 Acquired keratosis follicularis: Secondary | ICD-10-CM | POA: Diagnosis not present

## 2023-04-17 DIAGNOSIS — I739 Peripheral vascular disease, unspecified: Secondary | ICD-10-CM | POA: Diagnosis not present

## 2023-04-24 DIAGNOSIS — E119 Type 2 diabetes mellitus without complications: Secondary | ICD-10-CM | POA: Diagnosis not present

## 2023-05-11 DIAGNOSIS — I10 Essential (primary) hypertension: Secondary | ICD-10-CM | POA: Diagnosis not present

## 2023-05-11 DIAGNOSIS — E1165 Type 2 diabetes mellitus with hyperglycemia: Secondary | ICD-10-CM | POA: Diagnosis not present

## 2023-06-02 ENCOUNTER — Other Ambulatory Visit (HOSPITAL_COMMUNITY)
Admission: RE | Admit: 2023-06-02 | Discharge: 2023-06-02 | Disposition: A | Discharge status: Home / Self Care | Payer: 59 | Source: Ambulatory Visit | Attending: Internal Medicine | Admitting: Internal Medicine

## 2023-06-02 DIAGNOSIS — N1832 Chronic kidney disease, stage 3b: Secondary | ICD-10-CM | POA: Diagnosis not present

## 2023-06-02 DIAGNOSIS — E1165 Type 2 diabetes mellitus with hyperglycemia: Secondary | ICD-10-CM | POA: Insufficient documentation

## 2023-06-02 DIAGNOSIS — I10 Essential (primary) hypertension: Secondary | ICD-10-CM | POA: Insufficient documentation

## 2023-06-02 DIAGNOSIS — R600 Localized edema: Secondary | ICD-10-CM | POA: Diagnosis not present

## 2023-06-02 LAB — BASIC METABOLIC PANEL
Anion gap: 8 (ref 5–15)
BUN: 19 mg/dL (ref 8–23)
CO2: 25 mmol/L (ref 22–32)
Calcium: 9.3 mg/dL (ref 8.9–10.3)
Chloride: 105 mmol/L (ref 98–111)
Creatinine, Ser: 1.26 mg/dL — ABNORMAL HIGH (ref 0.44–1.00)
GFR, Estimated: 44 mL/min — ABNORMAL LOW (ref >60)
Glucose, Bld: 107 mg/dL — ABNORMAL HIGH (ref 70–99)
Potassium: 4.1 mmol/L (ref 3.5–5.1)
Sodium: 138 mmol/L (ref 135–145)

## 2023-07-02 DIAGNOSIS — E1165 Type 2 diabetes mellitus with hyperglycemia: Secondary | ICD-10-CM | POA: Diagnosis not present

## 2023-07-02 DIAGNOSIS — I10 Essential (primary) hypertension: Secondary | ICD-10-CM | POA: Diagnosis not present

## 2023-07-18 ENCOUNTER — Ambulatory Visit (HOSPITAL_COMMUNITY)
Admission: RE | Admit: 2023-07-18 | Discharge: 2023-07-18 | Disposition: A | Discharge status: Home / Self Care | Payer: 59 | Source: Ambulatory Visit | Attending: Gerontology | Admitting: Gerontology

## 2023-07-18 ENCOUNTER — Other Ambulatory Visit (HOSPITAL_COMMUNITY)
Admission: RE | Admit: 2023-07-18 | Discharge: 2023-07-18 | Disposition: A | Discharge status: Home / Self Care | Payer: 59 | Source: Ambulatory Visit | Attending: Internal Medicine | Admitting: Internal Medicine

## 2023-07-18 ENCOUNTER — Other Ambulatory Visit (HOSPITAL_COMMUNITY): Payer: Self-pay | Admitting: Gerontology

## 2023-07-18 DIAGNOSIS — E1165 Type 2 diabetes mellitus with hyperglycemia: Secondary | ICD-10-CM | POA: Diagnosis not present

## 2023-07-18 DIAGNOSIS — M545 Low back pain, unspecified: Secondary | ICD-10-CM

## 2023-07-18 DIAGNOSIS — M4726 Other spondylosis with radiculopathy, lumbar region: Secondary | ICD-10-CM | POA: Diagnosis not present

## 2023-07-18 DIAGNOSIS — M5136 Other intervertebral disc degeneration, lumbar region: Secondary | ICD-10-CM | POA: Diagnosis not present

## 2023-07-18 DIAGNOSIS — I10 Essential (primary) hypertension: Secondary | ICD-10-CM | POA: Diagnosis not present

## 2023-07-18 DIAGNOSIS — N1832 Chronic kidney disease, stage 3b: Secondary | ICD-10-CM | POA: Insufficient documentation

## 2023-07-18 LAB — BASIC METABOLIC PANEL
Anion gap: 10 (ref 5–15)
BUN: 24 mg/dL — ABNORMAL HIGH (ref 8–23)
CO2: 25 mmol/L (ref 22–32)
Calcium: 9.6 mg/dL (ref 8.9–10.3)
Chloride: 104 mmol/L (ref 98–111)
Creatinine, Ser: 1.47 mg/dL — ABNORMAL HIGH (ref 0.44–1.00)
GFR, Estimated: 37 mL/min — ABNORMAL LOW (ref >60)
Glucose, Bld: 109 mg/dL — ABNORMAL HIGH (ref 70–99)
Potassium: 4.4 mmol/L (ref 3.5–5.1)
Sodium: 139 mmol/L (ref 135–145)

## 2023-07-24 DIAGNOSIS — E1140 Type 2 diabetes mellitus with diabetic neuropathy, unspecified: Secondary | ICD-10-CM | POA: Diagnosis not present

## 2023-07-24 DIAGNOSIS — I739 Peripheral vascular disease, unspecified: Secondary | ICD-10-CM | POA: Diagnosis not present

## 2023-07-24 DIAGNOSIS — L110 Acquired keratosis follicularis: Secondary | ICD-10-CM | POA: Diagnosis not present

## 2023-07-24 DIAGNOSIS — M79672 Pain in left foot: Secondary | ICD-10-CM | POA: Diagnosis not present

## 2023-07-24 DIAGNOSIS — M79674 Pain in right toe(s): Secondary | ICD-10-CM | POA: Diagnosis not present

## 2023-07-24 DIAGNOSIS — M79671 Pain in right foot: Secondary | ICD-10-CM | POA: Diagnosis not present

## 2023-07-24 DIAGNOSIS — M79675 Pain in left toe(s): Secondary | ICD-10-CM | POA: Diagnosis not present

## 2023-08-01 ENCOUNTER — Encounter: Payer: Self-pay | Admitting: Orthopedic Surgery

## 2023-08-01 ENCOUNTER — Ambulatory Visit (INDEPENDENT_AMBULATORY_CARE_PROVIDER_SITE_OTHER): Payer: 59 | Admitting: Orthopedic Surgery

## 2023-08-01 VITALS — BP 138/84 | HR 67 | Ht 64.0 in | Wt 238.0 lb

## 2023-08-01 DIAGNOSIS — G8929 Other chronic pain: Secondary | ICD-10-CM | POA: Diagnosis not present

## 2023-08-01 DIAGNOSIS — M5441 Lumbago with sciatica, right side: Secondary | ICD-10-CM

## 2023-08-01 MED ORDER — PREDNISONE 10 MG (21) PO TBPK: ORAL_TABLET | ORAL | 0 refills | Status: AC

## 2023-08-01 NOTE — Progress Notes (Signed)
Return patient Visit  Assessment: Brianna Manning is a 77 y.o. female with the following: 1. Chronic bilateral low back pain, right-sided sciatica   Plan: Brianna Manning has chronic low back pain.  More recently, she has developed severe pain into the right leg.  She is having pain radiating down her right leg.  She notes some weakness.  It is progressively worsening.  She has previously done physical therapy.  I am recommending an MRI of her lumbar spine.  Depending on the results, we can consider injections or referral to a spine surgeon.  Prednisone Dosepak for pain.  We will meet in clinic to discuss the findings.  Follow-up: Return for After MRI.  Subjective:  Chief Complaint  Patient presents with   Back Pain    R LBP w radiation down the R leg to th foot getting worse    History of Present Illness: Brianna Manning is a 77 y.o. female who returns to clinic for repeat evaluation of low back pain.  I saw her over a year ago.  She completed some physical therapy, which did not improve her symptoms.  More recently, she notes that the pain is progressively worsening.  The pain is radiating into her right leg, into the buttock, and distally into her feet.  She also has some associated numbness and tingling.  It is getting worse.  She states that she is starting to appreciate some weakness.  This is why she is using a cane.   Review of Systems: No fevers or chills + numbness and tingling No chest pain No shortness of breath No bowel or bladder dysfunction No GI distress No headaches   Objective: BP 138/84   Pulse 67   Ht 5\' 4"  (1.626 m)   Wt 238 lb (108 kg)   BMI 40.85 kg/m   Physical Exam:  General: Alert and oriented., No acute distress., and Obese female. Gait: Ambulates with the assistance of a cane.  Tenderness to palpation along the lower back.  Positive straight leg raise on the right side.  Right lower extremity strength is 4/5.  Bilateral patellar tendon  reflexes are 1+.  Decreased sensation to the right foot..  IMAGING: I personally reviewed images previously obtained in clinic  Lumbar spine x-rays were recently obtained.  No acute injuries.  Diffuse degenerative changes.  Bridging osteophytes.  No anterolisthesis.   New Medications:  Meds ordered this encounter  Medications   predniSONE (STERAPRED UNI-PAK 21 TAB) 10 MG (21) TBPK tablet    Sig: 10 mg DS 12 as directed    Dispense:  48 tablet    Refill:  0      Oliver Barre, MD  08/01/2023 11:51 AM

## 2023-08-01 NOTE — Patient Instructions (Addendum)
[facility] [phone]   We will place an order for an MRI of your lumbar spine.  We will see in clinic to discuss the findings  Depending on the findings of the MRI, we can consider sending you to a colleague fort evaluation and consideration for injections.

## 2023-08-04 IMAGING — MG MM DIGITAL SCREENING BILAT W/ TOMO AND CAD
8 of 14 series · 8 of 40 positions shown · non-contrast
Comparison: Previous exam(s).

CLINICAL DATA: Screening.

EXAM:
DIGITAL SCREENING BILATERAL MAMMOGRAM WITH TOMOSYNTHESIS AND CAD
TECHNIQUE: Bilateral screening digital craniocaudal and mediolateral oblique
mammograms were obtained. Bilateral screening digital breast
tomosynthesis was performed. The images were evaluated with
computer-aided detection.

[L CC synth-2D (1 of 2)]
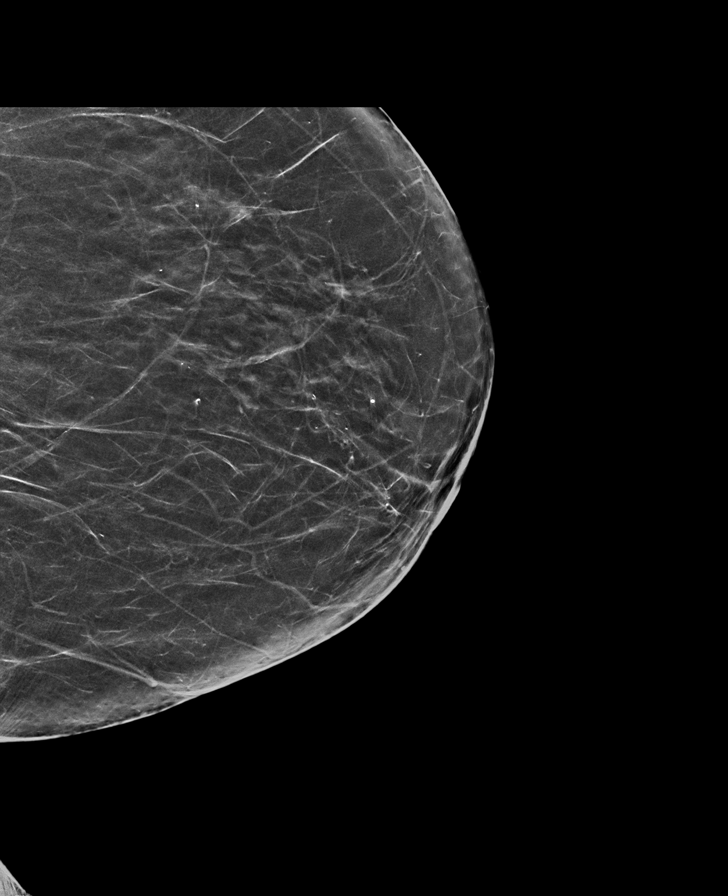

[R CC synth-2D (1 of 2)]
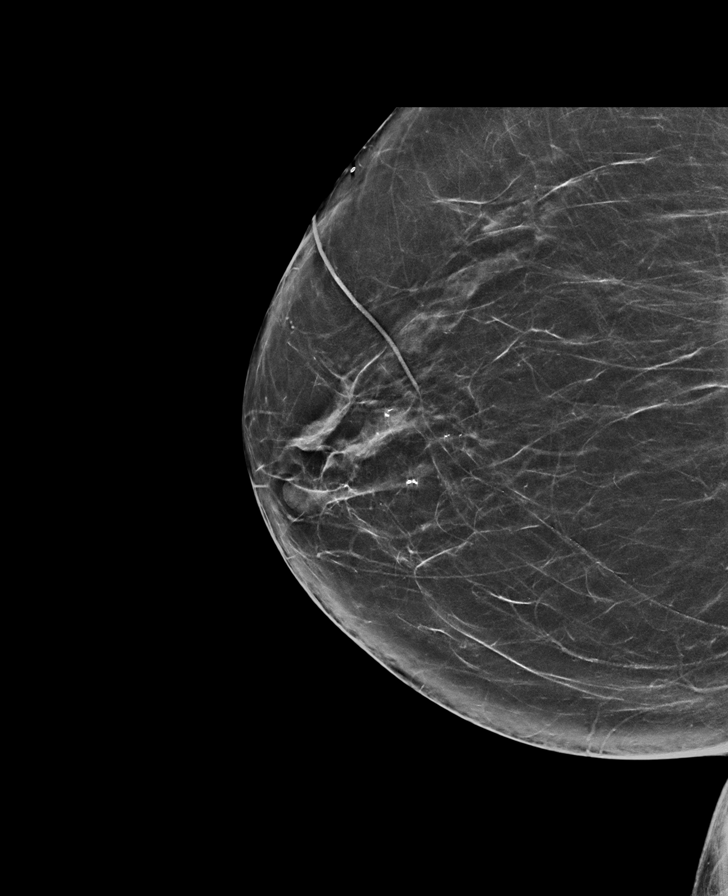

[R CC synth-2D (2 of 2)]
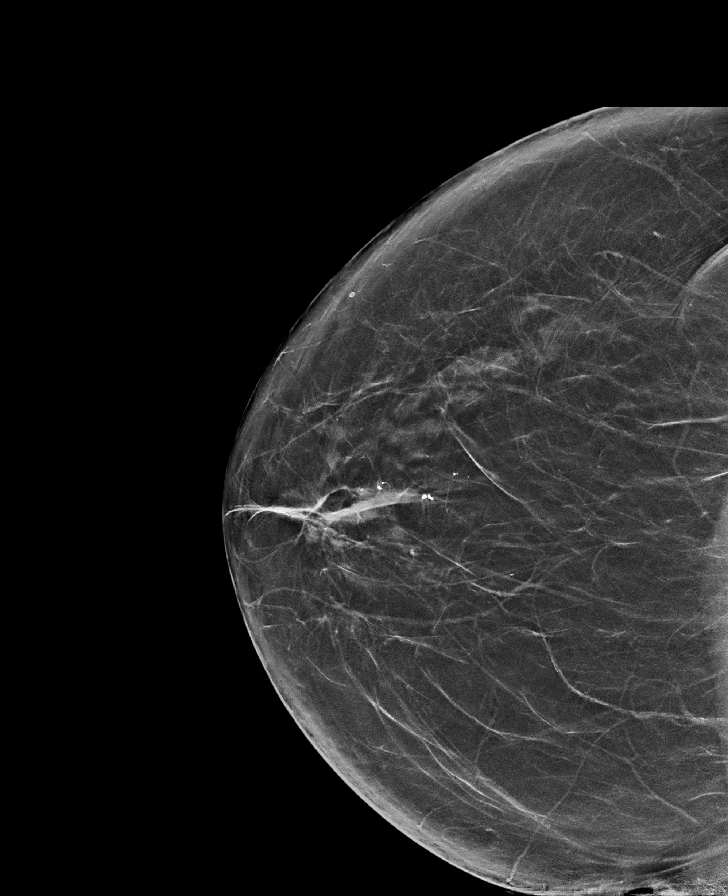

[R MLO synth-2D (1 of 2)]
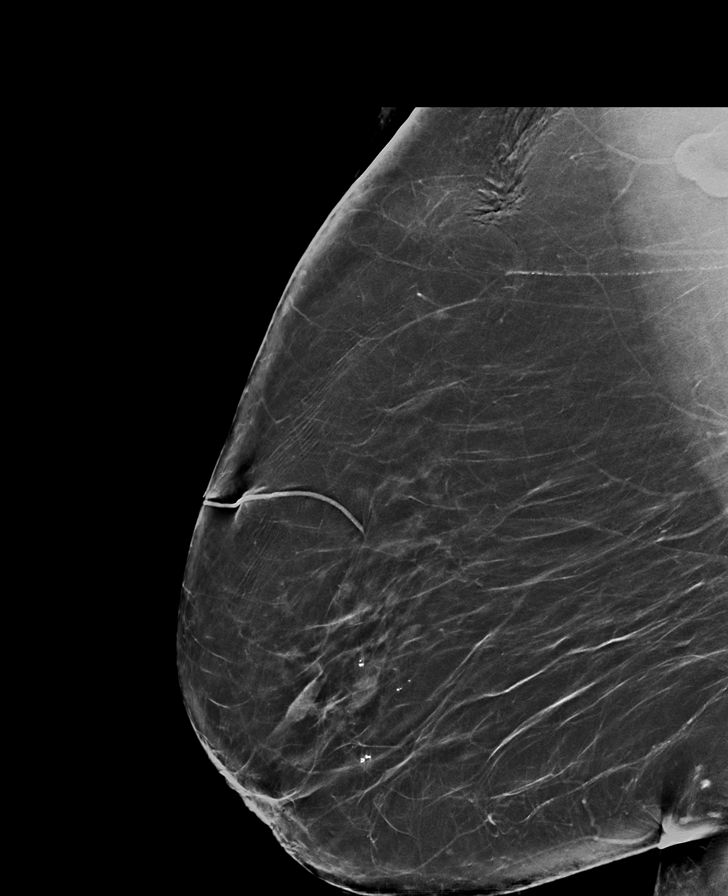

[R MLO synth-2D (2 of 2)]
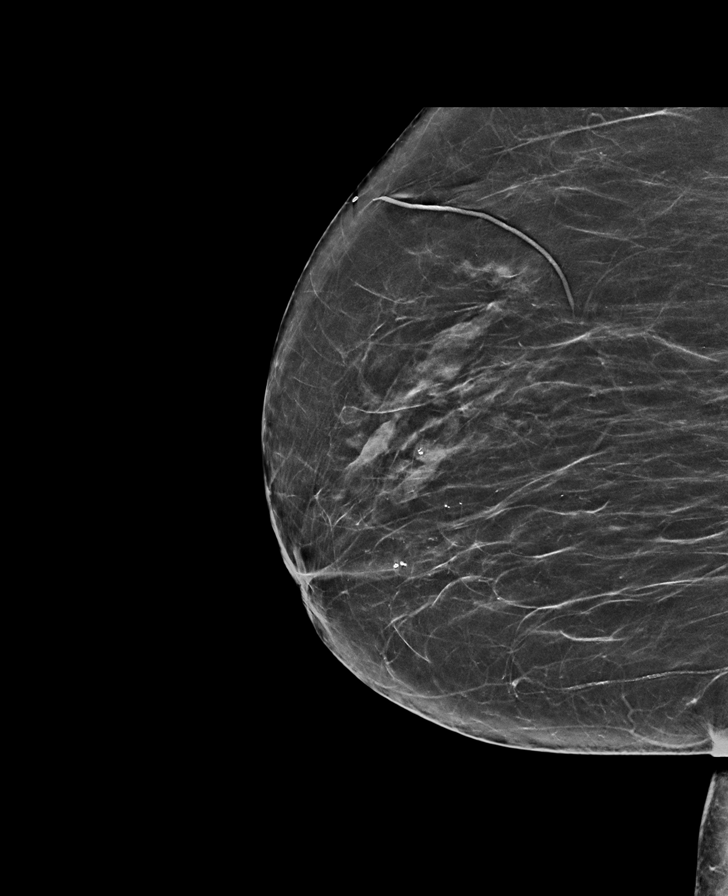

[L CC synth-2D (2 of 2)]
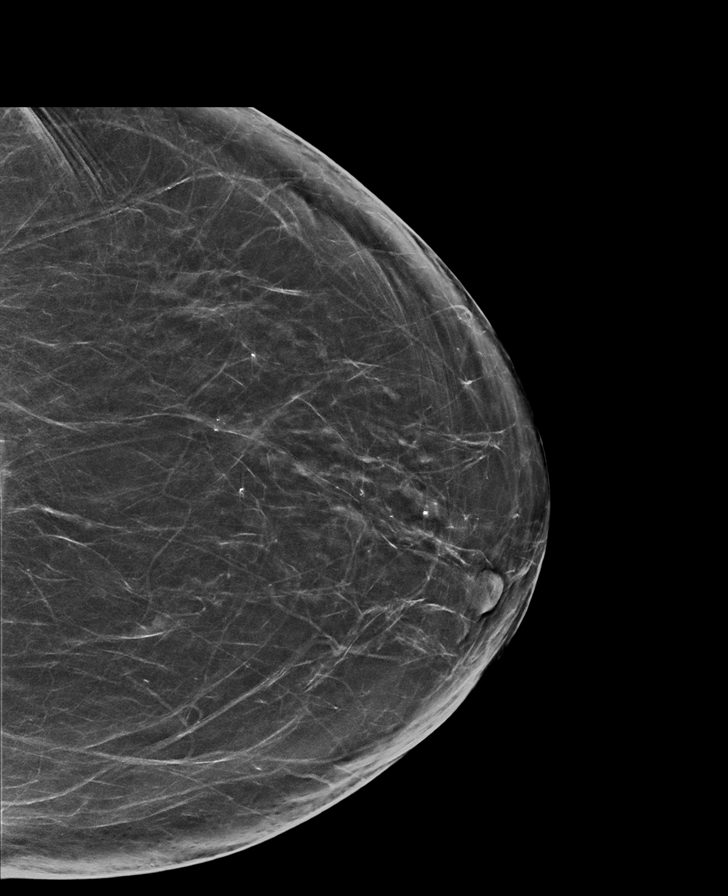

[L MLO synth-2D]
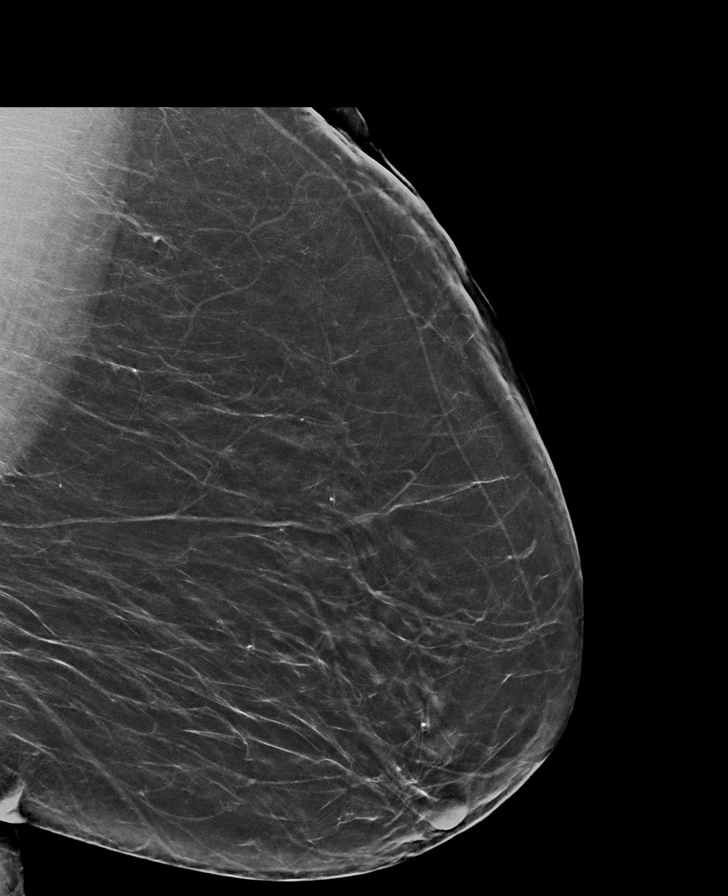

[R MLO tomo · tomo slice 42/83.0]
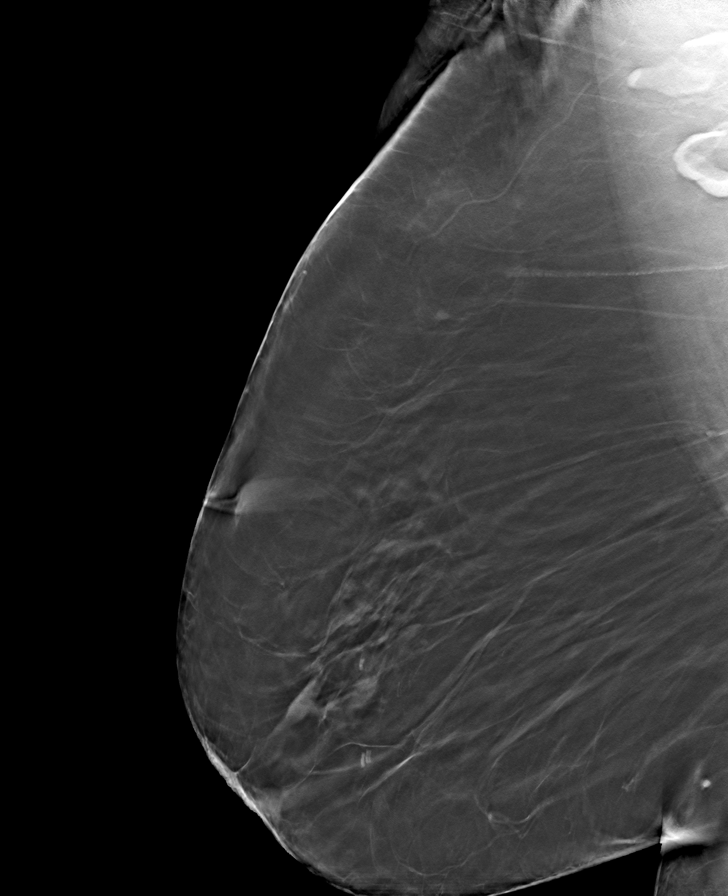

[8 of 40 positions shown; findings below may reference images not displayed]

ACR Breast Density Category b: There are scattered areas of
fibroglandular density.
FINDINGS: There are no findings suspicious for malignancy.
IMPRESSION: No mammographic evidence of malignancy. A result letter of this
screening mammogram will be mailed directly to the patient.

RECOMMENDATION:
Screening mammogram in one year. (Code:51-O-LD2)

BI-RADS CATEGORY  1: Negative.

## 2023-08-18 DIAGNOSIS — I10 Essential (primary) hypertension: Secondary | ICD-10-CM | POA: Diagnosis not present

## 2023-08-18 DIAGNOSIS — E1165 Type 2 diabetes mellitus with hyperglycemia: Secondary | ICD-10-CM | POA: Diagnosis not present

## 2023-08-29 ENCOUNTER — Other Ambulatory Visit (HOSPITAL_COMMUNITY)
Admission: RE | Admit: 2023-08-29 | Discharge: 2023-08-29 | Disposition: A | Discharge status: Home / Self Care | Payer: 59 | Source: Ambulatory Visit | Attending: Internal Medicine | Admitting: Internal Medicine

## 2023-08-29 DIAGNOSIS — N390 Urinary tract infection, site not specified: Secondary | ICD-10-CM | POA: Diagnosis not present

## 2023-08-29 LAB — URINALYSIS, W/ REFLEX TO CULTURE (INFECTION SUSPECTED)
Bilirubin Urine: NEGATIVE
Glucose, UA: NEGATIVE mg/dL
Hgb urine dipstick: NEGATIVE
Ketones, ur: NEGATIVE mg/dL
Nitrite: NEGATIVE
Protein, ur: NEGATIVE mg/dL
Specific Gravity, Urine: 1.012 (ref 1.005–1.030)
pH: 8 (ref 5.0–8.0)

## 2023-08-31 ENCOUNTER — Ambulatory Visit
Admission: RE | Admit: 2023-08-31 | Discharge: 2023-08-31 | Disposition: A | Discharge status: Home / Self Care | Payer: 59 | Source: Ambulatory Visit | Attending: Orthopedic Surgery | Admitting: Orthopedic Surgery

## 2023-08-31 DIAGNOSIS — M48061 Spinal stenosis, lumbar region without neurogenic claudication: Secondary | ICD-10-CM | POA: Diagnosis not present

## 2023-08-31 DIAGNOSIS — M5136 Other intervertebral disc degeneration, lumbar region: Secondary | ICD-10-CM | POA: Diagnosis not present

## 2023-08-31 DIAGNOSIS — G8929 Other chronic pain: Secondary | ICD-10-CM

## 2023-08-31 LAB — URINE CULTURE: Culture: 80000 — AB

## 2023-09-07 ENCOUNTER — Other Ambulatory Visit (HOSPITAL_COMMUNITY): Payer: Self-pay | Admitting: Internal Medicine

## 2023-09-07 DIAGNOSIS — Z1231 Encounter for screening mammogram for malignant neoplasm of breast: Secondary | ICD-10-CM

## 2023-09-12 ENCOUNTER — Encounter: Payer: Self-pay | Admitting: Orthopedic Surgery

## 2023-09-12 ENCOUNTER — Ambulatory Visit (INDEPENDENT_AMBULATORY_CARE_PROVIDER_SITE_OTHER): Payer: 59 | Admitting: Orthopedic Surgery

## 2023-09-12 VITALS — Ht 64.0 in | Wt 237.0 lb

## 2023-09-12 DIAGNOSIS — M5441 Lumbago with sciatica, right side: Secondary | ICD-10-CM | POA: Diagnosis not present

## 2023-09-12 DIAGNOSIS — G8929 Other chronic pain: Secondary | ICD-10-CM

## 2023-09-12 DIAGNOSIS — R202 Paresthesia of skin: Secondary | ICD-10-CM

## 2023-09-12 DIAGNOSIS — R200 Anesthesia of skin: Secondary | ICD-10-CM

## 2023-09-12 MED ORDER — TRAMADOL HCL 50 MG PO TABS: 50.0000 mg | ORAL_TABLET | Freq: Two times a day (BID) | ORAL | 0 refills | Status: DC | PRN

## 2023-09-12 NOTE — Progress Notes (Signed)
Return patient Visit  Assessment: Brianna Manning is a 77 y.o. female with the following: 1. Chronic bilateral low back pain, right-sided sciatica 2. Left hand numbness and tingling   Plan: Brianna Manning has chronic low back pain.  She continues to have radiating pains in the right leg.  MRI demonstrates multilevel degenerative changes, with moderate and severe stenosis at multiple levels.  Based on the MRI, and her ongoing symptoms, I am recommending referral to see Dr. Christell Constant.  In addition, she has been having some numbness and tingling in the left hand.  This is been ongoing for couple weeks.  Symptoms are consistent with tunnel syndrome, but once again the symptoms are acute.  I will provide her with a brace, for nighttime wear.  She will return to see me as needed.  Follow-up: Return for Referral to see Dr. Christell Constant.  Subjective:  Chief Complaint  Patient presents with   Back Pain    LBP here for MRI results    History of Present Illness: Brianna Manning is a 77 y.o. female who returns to clinic for repeat evaluation of low back pain.  She has obtained an MRI for her back.  She is still having pain in her back, and radiating pains into the right leg.  She is using a cane to ambulate.  She is taking Tylenol regular basis.  Recently, she started to have numbness and tingling the left hand.  This wakes her up at night.   Review of Systems: No fevers or chills + numbness and tingling No chest pain No shortness of breath No bowel or bladder dysfunction No GI distress No headaches   Objective: Ht 5\' 4"  (1.626 m)   Wt 237 lb (107.5 kg)   BMI 40.68 kg/m   Physical Exam:  General: Alert and oriented., No acute distress., and Obese female. Gait: Ambulates with the assistance of a cane.  Tenderness to palpation along the lower back.  Positive straight leg raise on the right side.  Right lower extremity strength is 4/5.  Bilateral patellar tendon reflexes are 1+.   Decreased sensation to the right foot.Marland Kitchen  Positive Tinel's.  Positive Phalen's of the left wrist.  Fingers warm well-perfused.  IMAGING: I personally reviewed images previously obtained in clinic   Lumbar spine MRI  IMPRESSION: 1. 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 New moderate to severe spinal canal and left lateral recess stenosis at L3-L4. 2. New moderate spinal canal stenosis and progressive moderate right neuroforaminal stenosis at L2-L3. 3. Unchanged moderate spinal canal stenosis and severe left lateral recess stenosis at L4-L5. 4. Progressive moderate bilateral neuroforaminal stenosis at L5-S1.       New Medications:  Meds ordered this encounter  Medications   traMADol (ULTRAM) 50 MG tablet    Sig: Take 1 tablet (50 mg total) by mouth every 12 (twelve) hours as needed.    Dispense:  20 tablet    Refill:  0      Oliver Barre, MD  09/12/2023 11:52 AM

## 2023-09-17 DIAGNOSIS — E1165 Type 2 diabetes mellitus with hyperglycemia: Secondary | ICD-10-CM | POA: Diagnosis not present

## 2023-09-17 DIAGNOSIS — I10 Essential (primary) hypertension: Secondary | ICD-10-CM | POA: Diagnosis not present

## 2023-10-12 ENCOUNTER — Ambulatory Visit: Payer: 59 | Admitting: Orthopedic Surgery

## 2023-10-12 ENCOUNTER — Encounter: Payer: Self-pay | Admitting: Orthopedic Surgery

## 2023-10-12 ENCOUNTER — Other Ambulatory Visit (INDEPENDENT_AMBULATORY_CARE_PROVIDER_SITE_OTHER): Payer: 59

## 2023-10-12 VITALS — BP 156/84 | HR 73 | Ht 64.0 in | Wt 238.0 lb

## 2023-10-12 DIAGNOSIS — M5441 Lumbago with sciatica, right side: Secondary | ICD-10-CM | POA: Diagnosis not present

## 2023-10-12 DIAGNOSIS — M5416 Radiculopathy, lumbar region: Secondary | ICD-10-CM | POA: Diagnosis not present

## 2023-10-12 DIAGNOSIS — G8929 Other chronic pain: Secondary | ICD-10-CM | POA: Diagnosis not present

## 2023-10-12 NOTE — Progress Notes (Signed)
Orthopedic Spine Surgery Office Note  Assessment: Patient is a 77 y.o. female with low back pain that radiates into bilateral lower extremities.  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 I told her that her options were to do no intervention at all and continue to live with it, try pain management, or laminectomy surgery -After this discussion, about an injection and wanted to do that so a referral was provided to Dr. Boyd Blas office for that -Told her that weight loss would likely be beneficial too and, if she ever decides on surgery, it would lower her risk for complication if she did lose some weight -Patient should return to office in 6 weeks, x-rays at next visit: None   Patient expressed understanding of the plan and all questions were answered to the patient's satisfaction.   ___________________________________________________________________________   History:  Patient is a 77 y.o. female who presents today for lumbar spine.  Patient has had several years of low back pain that radiates into bilateral lower extremities.  She feels it goes along the anterior and lateral aspects of the thighs and legs to the level of the ankle.  She notes the pain at rest and with activity.  She says that it is worse if she is standing or walking and gets better if she sits down but it does not go away.  She says that pain has gotten progressively worse with time.  She has not found any treatment to provide her with lasting relief.  There is no trauma or injury that preceded the onset of her pain.   Weakness: Yes, at times her legs feel weaker and will give out on her.  No specific weakness noted. Symptoms of imbalance: Yes, when her legs  gave out she does feel off balance.  Has noticed this for several years.  No recent changes. Paresthesias and numbness: Denies Bowel or bladder incontinence: Yes, has had 2 years of urinary incontinence.  No recent changes.  No bowel incontinence. Saddle anesthesia: Denies  Treatments tried: PT, Tylenol, Aleve, gabapentin, muscle laxer's, lumbar steroid injections  Review of systems: Denies fevers and chills, night sweats, unexplained weight loss, history of cancer.  Has had pain that wakes her at night  Past medical history: Vertigo Diabetes (last A1c was 6.0 on 01/18/2023) HTN HLD GERD  Allergies: NKDA  Past surgical history:  Cataract surgery Hysterectomy  Social history: Denies use of nicotine product (smoking, vaping, patches, smokeless) Alcohol use: Denies Denies recreational drug use   Physical Exam:  BMI of 39.8  General: no acute distress, appears stated age Neurologic: alert, answering questions appropriately, following commands Respiratory: unlabored breathing on room air, symmetric chest rise Psychiatric: appropriate affect, normal cadence to speech   MSK (spine):  -Strength exam      Left  Right EHL    4/5  4/5 TA    5/5  5/5 GSC    5/5  5/5 Knee extension  5/5  5/5 Hip flexion   5/5  5/5  -Sensory exam    Sensation intact to light touch in L3-S1 nerve distributions of bilateral lower extremities  -Achilles DTR: 2/4 on the left, 2/4 on the right -Patellar tendon DTR: 2/4 on the left, 2/4 on the right  -  Straight leg raise: Negative bilaterally -Femoral nerve stretch test: Negative bilaterally -Clonus: no beats bilaterally -Negative Hoffmann bilaterally -Negative grip and release test -No interosseous muscle wasting seen  -Left hip exam: No pain through range of motion, negative Stinchfield, negative FABER -Right hip exam: No pain through range of motion, negative Stinchfield, negative FABER  Imaging: XRs of the lumbar spine from 10/12/2023  was independently reviewed and interpreted, showing disc height loss at L2/3, L3/4, L4/5, L5/S1. Anterior osteophytes seen at L2/3, L3/4. No evidence of instability on flexion/extension views. No fracture or dislocation seen.   MRI of the lumbar spine from 10/12/2023 was independently reviewed and interpreted, showing disc bulges at multiple levels in the lumbar spine.  Central stenosis and bilateral foraminal at L2/3.  Central, lateral recess, and bilateral foraminal stenosis at L3/4.  Central, lateral recess, and bilateral foraminal stenosis at L4/5.  No other significant stenosis seen.   Patient name: Brianna Manning Patient MRN: [id number] Date of visit: 10/12/23

## 2023-10-16 ENCOUNTER — Encounter (HOSPITAL_COMMUNITY): Payer: Self-pay

## 2023-10-16 ENCOUNTER — Ambulatory Visit (HOSPITAL_COMMUNITY)
Admission: RE | Admit: 2023-10-16 | Discharge: 2023-10-16 | Disposition: A | Discharge status: Home / Self Care | Payer: 59 | Source: Ambulatory Visit | Attending: Internal Medicine | Admitting: Internal Medicine

## 2023-10-16 DIAGNOSIS — Z23 Encounter for immunization: Secondary | ICD-10-CM | POA: Diagnosis not present

## 2023-10-16 DIAGNOSIS — Z1231 Encounter for screening mammogram for malignant neoplasm of breast: Secondary | ICD-10-CM | POA: Insufficient documentation

## 2023-10-18 DIAGNOSIS — E1165 Type 2 diabetes mellitus with hyperglycemia: Secondary | ICD-10-CM | POA: Diagnosis not present

## 2023-10-18 DIAGNOSIS — I10 Essential (primary) hypertension: Secondary | ICD-10-CM | POA: Diagnosis not present

## 2023-10-23 ENCOUNTER — Other Ambulatory Visit: Payer: Self-pay

## 2023-10-23 ENCOUNTER — Ambulatory Visit (INDEPENDENT_AMBULATORY_CARE_PROVIDER_SITE_OTHER): Payer: 59 | Admitting: Physical Medicine and Rehabilitation

## 2023-10-23 ENCOUNTER — Encounter: Payer: Self-pay | Admitting: Physical Medicine and Rehabilitation

## 2023-10-23 VITALS — BP 143/59 | HR 71

## 2023-10-23 DIAGNOSIS — M5416 Radiculopathy, lumbar region: Secondary | ICD-10-CM | POA: Diagnosis not present

## 2023-10-23 MED ORDER — METHYLPREDNISOLONE ACETATE 40 MG/ML IJ SUSP
40.0000 mg | Freq: Once | INTRAMUSCULAR | Status: AC
Start: 2023-10-23 — End: 2023-10-23
  Administered 2023-10-23: 40 mg

## 2023-10-23 NOTE — Patient Instructions (Signed)

## 2023-10-23 NOTE — Progress Notes (Signed)
Functional Pain Scale - descriptive words and definitions  Immobilizing (10)   Unable to move or talk due to intensity of pain/unable to sleep and unable to use distraction. Severe range order  Average Pain 7   +Driver, -BT, -Dye Allergies.

## 2023-10-23 NOTE — Procedures (Signed)
Lumbar Epidural Steroid Injection - Interlaminar Approach with Fluoroscopic Guidance  Patient: Brianna Manning      Date of Birth: 03-11-1946 MRN: [id number] PCP: Benetta Spar, MD      Visit Date: 10/23/2023   Universal Protocol:     Consent Given By: the patient  Position: PRONE  Additional Comments: Vital signs were monitored before and after the procedure. Patient was prepped and draped in the usual sterile fashion. The correct patient, procedure, and site was verified.   Injection Procedure Details:   Procedure diagnoses: Radiculopathy, lumbar region [M54.16]   Meds Administered:  Meds ordered this encounter  Medications   methylPREDNISolone acetate (DEPO-MEDROL) injection 40 mg     Laterality: Right  Location/Site:  L5-S1  Needle: 4.5 in., 20 ga. Tuohy  Needle Placement: Paramedian epidural  Findings:   -Comments: Excellent flow of contrast into the epidural space.  Procedure Details: Using a paramedian approach from the side mentioned above, the region overlying the inferior lamina was localized under fluoroscopic visualization and the soft tissues overlying this structure were infiltrated with 4 ml. of 1% Lidocaine without Epinephrine. The Tuohy needle was inserted into the epidural space using a paramedian approach.   The epidural space was localized using loss of resistance along with counter oblique bi-planar fluoroscopic views.  After negative aspirate for air, blood, and CSF, a 2 ml. volume of Isovue-250 was injected into the epidural space and the flow of contrast was observed. Radiographs were obtained for documentation purposes.    The injectate was administered into the level noted above.   Additional Comments:  No complications occurred Dressing: 2 x 2 sterile gauze and Band-Aid    Post-procedure details: Patient was observed during the procedure. Post-procedure instructions were reviewed.  Patient left the clinic in stable  condition.

## 2023-10-23 NOTE — Progress Notes (Signed)
Brianna Manning - 77 y.o. female MRN [id number]  Date of birth: [birth year]  Office Visit Note: Visit Date: 10/23/2023 PCP: Benetta Spar, MD Referred by: London Sheer, MD  Subjective: Chief Complaint  Patient presents with   Lower Back - Follow-up   HPI:  Brianna Manning is a 77 y.o. female who comes in today at the request of Dr. Willia Craze for planned Right L4-5 Lumbar Interlaminar epidural steroid injection with fluoroscopic guidance.  The patient has failed conservative care including home exercise, medications, time and activity modification.  This injection will be diagnostic and hopefully therapeutic.  Please see requesting physician notes for further details and justification.   ROS Otherwise per HPI.  Assessment & Plan: Visit Diagnoses:    ICD-10-CM   1. Radiculopathy, lumbar region  M54.16 XR C-ARM NO REPORT    Epidural Steroid injection    methylPREDNISolone acetate (DEPO-MEDROL) injection 40 mg      Plan: No additional findings.   Meds & Orders:  Meds ordered this encounter  Medications   methylPREDNISolone acetate (DEPO-MEDROL) injection 40 mg    Orders Placed This Encounter  Procedures   XR C-ARM NO REPORT   Epidural Steroid injection    Follow-up: Return for visit to requesting provider as needed.   Procedures: No procedures performed  Lumbar Epidural Steroid Injection - Interlaminar Approach with Fluoroscopic Guidance  Patient: Brianna Manning      Date of Birth: November 05, 1946 MRN: [id number] PCP: Benetta Spar, MD      Visit Date: 10/23/2023   Universal Protocol:     Consent Given By: the patient  Position: PRONE  Additional Comments: Vital signs were monitored before and after the procedure. Patient was prepped and draped in the usual sterile fashion. The correct patient, procedure, and site was verified.   Injection Procedure Details:   Procedure diagnoses: Radiculopathy, lumbar region [M54.16]   Meds  Administered:  Meds ordered this encounter  Medications   methylPREDNISolone acetate (DEPO-MEDROL) injection 40 mg     Laterality: Right  Location/Site:  L5-S1  Needle: 4.5 in., 20 ga. Tuohy  Needle Placement: Paramedian epidural  Findings:   -Comments: Excellent flow of contrast into the epidural space.  Procedure Details: Using a paramedian approach from the side mentioned above, the region overlying the inferior lamina was localized under fluoroscopic visualization and the soft tissues overlying this structure were infiltrated with 4 ml. of 1% Lidocaine without Epinephrine. The Tuohy needle was inserted into the epidural space using a paramedian approach.   The epidural space was localized using loss of resistance along with counter oblique bi-planar fluoroscopic views.  After negative aspirate for air, blood, and CSF, a 2 ml. volume of Isovue-250 was injected into the epidural space and the flow of contrast was observed. Radiographs were obtained for documentation purposes.    The injectate was administered into the level noted above.   Additional Comments:  No complications occurred Dressing: 2 x 2 sterile gauze and Band-Aid    Post-procedure details: Patient was observed during the procedure. Post-procedure instructions were reviewed.  Patient left the clinic in stable condition.   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 No injury or prior surgery.   EXAM: MRI LUMBAR SPINE WITHOUT CONTRAST   TECHNIQUE: Multiplanar, multisequence MR imaging of the lumbar spine was performed. No intravenous contrast was administered.   COMPARISON:  Lumbar spine x-rays dated July 18, 2023. MRI lumbar spine dated August 02, 2016.  FINDINGS: Segmentation:  Standard.   Alignment:  Physiologic.   Vertebrae:  No fracture, evidence of discitis, or bone lesion.   Conus medullaris and cauda equina: Conus extends to the L1-L2 level. Conus  and cauda equina appear normal.   Paraspinal and other soft tissues: Negative.   Disc levels:   T12-L1:  Negative.   L1-L2: Moderate disc bulging. Mild right neuroforaminal stenosis. No spinal canal or left neuroforaminal stenosis. Findings are unchanged.   L2-L3: Moderate disc bulging. Ligamentum flavum hypertrophy. Moderate spinal canal stenosis. Moderate right and mild left neuroforaminal stenosis. Findings have progressed.   L3-L4: Moderate disc bulging. Bilateral facet and ligamentum flavum hypertrophy. Moderate to severe spinal canal and left lateral recess stenosis. Moderate to severe left and moderate right neuroforaminal stenosis. Findings have progressed.   L4-L5: Moderate disc bulging with superimposed left subarticular disc protrusion. Mild-to-moderate bilateral facet arthropathy. Moderate spinal canal stenosis. Severe left and moderate right lateral recess stenosis. Mild bilateral neuroforaminal stenosis. Findings are unchanged.   L5-S1: Mild-to-moderate disc bulging with superimposed left foraminal disc protrusion. Mild-to-moderate bilateral facet arthropathy moderate bilateral neuroforaminal stenosis. No spinal canal stenosis. Findings have progressed.   IMPRESSION: 1. 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 New moderate to severe spinal canal and left lateral recess stenosis at L3-L4. 2. New moderate spinal canal stenosis and progressive moderate right neuroforaminal stenosis at L2-L3. 3. Unchanged moderate spinal canal stenosis and severe left lateral recess stenosis at L4-L5. 4. Progressive moderate bilateral neuroforaminal stenosis at L5-S1.     Electronically Signed   By: Obie Dredge M.D.   On: 09/12/2023 10:11     Objective:  VS:  HT:    WT:   BMI:     BP:(!) 143/59  HR:71bpm  TEMP: ( )  RESP:  Physical Exam Vitals and nursing note reviewed.  Constitutional:      General: She is not in  acute distress.    Appearance: Normal appearance. She is obese. She is not ill-appearing.  HENT:     Head: Normocephalic and atraumatic.     Right Ear: External ear normal.     Left Ear: External ear normal.  Eyes:     Extraocular Movements: Extraocular movements intact.  Cardiovascular:     Rate and Rhythm: Normal rate.     Pulses: Normal pulses.  Pulmonary:     Effort: Pulmonary effort is normal. No respiratory distress.  Abdominal:     General: There is no distension.     Palpations: Abdomen is soft.  Musculoskeletal:        General: Tenderness present.     Cervical back: Neck supple.     Right lower leg: No edema.     Left lower leg: No edema.     Comments: Patient has good distal strength with no pain over the greater trochanters.  No clonus or focal weakness.  Skin:    Findings: No erythema, lesion or rash.  Neurological:     General: No focal deficit present.     Mental Status: She is alert and oriented to person, place, and time.     Sensory: No sensory deficit.     Motor: No weakness or abnormal muscle tone.     Coordination: Coordination normal.  Psychiatric:        Mood and Affect: Mood normal.        Behavior: Behavior normal.      Imaging: No results found.

## 2023-10-30 DIAGNOSIS — M79671 Pain in right foot: Secondary | ICD-10-CM | POA: Diagnosis not present

## 2023-10-30 DIAGNOSIS — E1140 Type 2 diabetes mellitus with diabetic neuropathy, unspecified: Secondary | ICD-10-CM | POA: Diagnosis not present

## 2023-10-30 DIAGNOSIS — M79672 Pain in left foot: Secondary | ICD-10-CM | POA: Diagnosis not present

## 2023-10-30 DIAGNOSIS — M79674 Pain in right toe(s): Secondary | ICD-10-CM | POA: Diagnosis not present

## 2023-10-30 DIAGNOSIS — I739 Peripheral vascular disease, unspecified: Secondary | ICD-10-CM | POA: Diagnosis not present

## 2023-10-30 DIAGNOSIS — M79675 Pain in left toe(s): Secondary | ICD-10-CM | POA: Diagnosis not present

## 2023-10-30 DIAGNOSIS — L110 Acquired keratosis follicularis: Secondary | ICD-10-CM | POA: Diagnosis not present

## 2023-11-17 DIAGNOSIS — I10 Essential (primary) hypertension: Secondary | ICD-10-CM | POA: Diagnosis not present

## 2023-11-17 DIAGNOSIS — E1165 Type 2 diabetes mellitus with hyperglycemia: Secondary | ICD-10-CM | POA: Diagnosis not present

## 2023-11-23 ENCOUNTER — Ambulatory Visit: Payer: 59 | Admitting: Orthopedic Surgery

## 2023-11-23 ENCOUNTER — Encounter: Payer: Self-pay | Admitting: Orthopedic Surgery

## 2023-11-23 DIAGNOSIS — G8929 Other chronic pain: Secondary | ICD-10-CM

## 2023-11-23 DIAGNOSIS — M5442 Lumbago with sciatica, left side: Secondary | ICD-10-CM | POA: Diagnosis not present

## 2023-11-23 DIAGNOSIS — M5441 Lumbago with sciatica, right side: Secondary | ICD-10-CM

## 2023-11-23 MED ORDER — TRAMADOL HCL 50 MG PO TABS: 50.0000 mg | ORAL_TABLET | Freq: Four times a day (QID) | ORAL | 0 refills | Status: AC | PRN

## 2023-11-23 NOTE — Progress Notes (Addendum)
Orthopedic Spine Surgery Office Note   Assessment: Patient is a 77 y.o. female with low back pain that radiates into bilateral lower extremities.  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  She did not want to undergo spine surgery and asked if there were any other options.  She has tried the conservative treatments I can think of, so I talked about pain management.  She wanted to try that.  Referral was provided to her today. -Encouraged her to continue to work on weight loss -Patient should return to office in 8 weeks, x-rays at next visit: None     Patient expressed understanding of the plan and all questions were answered to the patient's satisfaction.    ___________________________________________________________________________     History:   Patient is a 77 y.o. female who presents today for follow-up on her lumbar spine.  Patient continues to have low back pain that radiates into the bilateral lower extremities.  She feels a goes along the anterior lateral aspect of the thighs and legs.  It radiates to the level of the ankle.  She says that she feels the equally in her back and in her legs.  She notes that the pain is significantly worse when she is upright.  It gets better if she leans over a shopping cart or sits down.  She has not found any treatments that provide her with lasting relief.     Treatments tried: PT, Tylenol, Aleve, gabapentin, muscle relaxers, lumbar steroid injections    Physical Exam:   General: no acute distress, appears stated age Neurologic: alert, answering  questions appropriately, following commands Respiratory: unlabored breathing on room air, symmetric chest rise Psychiatric: appropriate affect, normal cadence to speech     MSK (spine):   -Strength exam                                                   Left                  Right EHL                              4/5                  4/5 TA                                 5/5                  5/5 GSC                             5/5                  5/5 Knee extension            5/5  5/5 Hip flexion                    5/5                  5/5   -Sensory exam                           Sensation intact to light touch in L3-S1 nerve distributions of bilateral lower extremities   -Achilles DTR: 2/4 on the left, 2/4 on the right -Patellar tendon DTR: 2/4 on the left, 2/4 on the right   -Straight leg raise: Negative bilaterally -Femoral nerve stretch test: Negative bilaterally -Palpable DP pulses bilaterally, feet warm and well perfused   -Left hip exam: No pain through range of motion, negative Stinchfield, negative FABER -Right hip exam: No pain through range of motion, negative Stinchfield, negative FABER   Imaging: XRs of the lumbar spine from 10/12/2023 were previously independently reviewed and interpreted, showing disc height loss at L2/3, L3/4, L4/5, L5/S1. Anterior osteophytes seen at L2/3, L3/4. No evidence of instability on flexion/extension views. No fracture or dislocation seen.    MRI of the lumbar spine from 10/12/2023 was previously independently reviewed and interpreted, showing disc bulges at multiple levels in the lumbar spine.  Central stenosis and bilateral foraminal at L2/3.  Central, lateral recess, and bilateral foraminal stenosis at L3/4.  Central, lateral recess, and bilateral foraminal stenosis at L4/5.  No other significant stenosis seen.     Patient name: Brianna Manning Patient MRN: [id number] Date of visit: 11/23/23

## 2023-12-18 DIAGNOSIS — I10 Essential (primary) hypertension: Secondary | ICD-10-CM | POA: Diagnosis not present

## 2023-12-18 DIAGNOSIS — E1165 Type 2 diabetes mellitus with hyperglycemia: Secondary | ICD-10-CM | POA: Diagnosis not present

## 2024-01-04 ENCOUNTER — Telehealth: Payer: Self-pay | Admitting: Orthopedic Surgery

## 2024-01-04 NOTE — Telephone Encounter (Signed)
Patient checking the status of the referral for pain management. Please call [phone]

## 2024-01-04 NOTE — Telephone Encounter (Signed)
I called and advised that she referred to [facility] in [place], [place] their phone [phone]

## 2024-01-18 ENCOUNTER — Other Ambulatory Visit (HOSPITAL_COMMUNITY)
Admission: RE | Admit: 2024-01-18 | Discharge: 2024-01-18 | Disposition: A | Discharge status: Home / Self Care | Payer: 59 | Source: Ambulatory Visit | Attending: Internal Medicine | Admitting: Internal Medicine

## 2024-01-18 DIAGNOSIS — I10 Essential (primary) hypertension: Secondary | ICD-10-CM | POA: Diagnosis not present

## 2024-01-18 DIAGNOSIS — M1990 Unspecified osteoarthritis, unspecified site: Secondary | ICD-10-CM | POA: Diagnosis not present

## 2024-01-18 DIAGNOSIS — E1165 Type 2 diabetes mellitus with hyperglycemia: Secondary | ICD-10-CM | POA: Diagnosis not present

## 2024-01-18 DIAGNOSIS — Z1389 Encounter for screening for other disorder: Secondary | ICD-10-CM | POA: Diagnosis not present

## 2024-01-18 DIAGNOSIS — E785 Hyperlipidemia, unspecified: Secondary | ICD-10-CM | POA: Diagnosis not present

## 2024-01-18 DIAGNOSIS — R600 Localized edema: Secondary | ICD-10-CM | POA: Diagnosis not present

## 2024-01-18 DIAGNOSIS — N1832 Chronic kidney disease, stage 3b: Secondary | ICD-10-CM | POA: Diagnosis not present

## 2024-01-18 DIAGNOSIS — E7849 Other hyperlipidemia: Secondary | ICD-10-CM | POA: Diagnosis not present

## 2024-01-18 DIAGNOSIS — Z0001 Encounter for general adult medical examination with abnormal findings: Secondary | ICD-10-CM | POA: Diagnosis present

## 2024-01-18 DIAGNOSIS — J4540 Moderate persistent asthma, uncomplicated: Secondary | ICD-10-CM | POA: Diagnosis not present

## 2024-01-18 DIAGNOSIS — E1122 Type 2 diabetes mellitus with diabetic chronic kidney disease: Secondary | ICD-10-CM | POA: Insufficient documentation

## 2024-01-18 DIAGNOSIS — Z23 Encounter for immunization: Secondary | ICD-10-CM | POA: Diagnosis not present

## 2024-01-18 DIAGNOSIS — I129 Hypertensive chronic kidney disease with stage 1 through stage 4 chronic kidney disease, or unspecified chronic kidney disease: Secondary | ICD-10-CM | POA: Diagnosis not present

## 2024-01-18 DIAGNOSIS — N390 Urinary tract infection, site not specified: Secondary | ICD-10-CM | POA: Diagnosis not present

## 2024-01-18 LAB — BASIC METABOLIC PANEL
Anion gap: 12 (ref 5–15)
BUN: 19 mg/dL (ref 8–23)
CO2: 24 mmol/L (ref 22–32)
Calcium: 9.7 mg/dL (ref 8.9–10.3)
Chloride: 107 mmol/L (ref 98–111)
Creatinine, Ser: 1.26 mg/dL — ABNORMAL HIGH (ref 0.44–1.00)
GFR, Estimated: 44 mL/min — ABNORMAL LOW (ref >60)
Glucose, Bld: 108 mg/dL — ABNORMAL HIGH (ref 70–99)
Potassium: 3.6 mmol/L (ref 3.5–5.1)
Sodium: 143 mmol/L (ref 135–145)

## 2024-01-18 LAB — CBC WITH DIFFERENTIAL/PLATELET
Abs Immature Granulocytes: 0.03 10*3/uL (ref 0.00–0.07)
Basophils Absolute: 0.1 10*3/uL (ref 0.0–0.1)
Basophils Relative: 1 %
Eosinophils Absolute: 0.2 10*3/uL (ref 0.0–0.5)
Eosinophils Relative: 3 %
HCT: 39.6 % (ref 36.0–46.0)
Hemoglobin: 13 g/dL (ref 12.0–15.0)
Immature Granulocytes: 0 %
Lymphocytes Relative: 23 %
Lymphs Abs: 2 10*3/uL (ref 0.7–4.0)
MCH: 31.5 pg (ref 26.0–34.0)
MCHC: 32.8 g/dL (ref 30.0–36.0)
MCV: 95.9 fL (ref 80.0–100.0)
Monocytes Absolute: 0.7 10*3/uL (ref 0.1–1.0)
Monocytes Relative: 8 %
Neutro Abs: 5.7 10*3/uL (ref 1.7–7.7)
Neutrophils Relative %: 65 %
Platelets: 237 10*3/uL (ref 150–400)
RBC: 4.13 MIL/uL (ref 3.87–5.11)
RDW: 12.7 % (ref 11.5–15.5)
WBC: 8.8 10*3/uL (ref 4.0–10.5)
nRBC: 0 % (ref 0.0–0.2)

## 2024-01-18 LAB — LIPID PANEL
Cholesterol: 131 mg/dL (ref 0–200)
HDL: 46 mg/dL (ref >40)
LDL Cholesterol: 60 mg/dL (ref 0–99)
Total CHOL/HDL Ratio: 2.8 {ratio}
Triglycerides: 126 mg/dL (ref <150)
VLDL: 25 mg/dL (ref 0–40)

## 2024-01-18 LAB — URINALYSIS, W/ REFLEX TO CULTURE (INFECTION SUSPECTED)
Bilirubin Urine: NEGATIVE
Glucose, UA: NEGATIVE mg/dL
Hgb urine dipstick: NEGATIVE
Ketones, ur: NEGATIVE mg/dL
Leukocytes,Ua: NEGATIVE
Nitrite: NEGATIVE
Protein, ur: NEGATIVE mg/dL
Specific Gravity, Urine: 1.011 (ref 1.005–1.030)
pH: 6 (ref 5.0–8.0)

## 2024-01-18 LAB — HEPATIC FUNCTION PANEL
ALT: 10 U/L (ref 0–44)
AST: 18 U/L (ref 15–41)
Albumin: 4.2 g/dL (ref 3.5–5.0)
Alkaline Phosphatase: 73 U/L (ref 38–126)
Bilirubin, Direct: 0.1 mg/dL (ref 0.0–0.2)
Indirect Bilirubin: 0.6 mg/dL (ref 0.3–0.9)
Total Bilirubin: 0.7 mg/dL (ref 0.0–1.2)
Total Protein: 7.3 g/dL (ref 6.5–8.1)

## 2024-01-18 LAB — HEMOGLOBIN A1C
Hgb A1c MFr Bld: 6.3 % — ABNORMAL HIGH (ref 4.8–5.6)
Mean Plasma Glucose: 134.11 mg/dL

## 2024-01-20 LAB — MICROALBUMIN / CREATININE URINE RATIO
Creatinine, Urine: 94.4 mg/dL
Microalb Creat Ratio: 20 mg/g{creat} (ref 0–29)
Microalb, Ur: 19.2 ug/mL — ABNORMAL HIGH

## 2024-01-24 ENCOUNTER — Ambulatory Visit: Payer: 59 | Admitting: Orthopedic Surgery

## 2024-01-29 DIAGNOSIS — E1140 Type 2 diabetes mellitus with diabetic neuropathy, unspecified: Secondary | ICD-10-CM | POA: Diagnosis not present

## 2024-01-29 DIAGNOSIS — I739 Peripheral vascular disease, unspecified: Secondary | ICD-10-CM | POA: Diagnosis not present

## 2024-01-29 DIAGNOSIS — L110 Acquired keratosis follicularis: Secondary | ICD-10-CM | POA: Diagnosis not present

## 2024-01-29 DIAGNOSIS — M79674 Pain in right toe(s): Secondary | ICD-10-CM | POA: Diagnosis not present

## 2024-01-29 DIAGNOSIS — M79672 Pain in left foot: Secondary | ICD-10-CM | POA: Diagnosis not present

## 2024-01-29 DIAGNOSIS — M79675 Pain in left toe(s): Secondary | ICD-10-CM | POA: Diagnosis not present

## 2024-01-29 DIAGNOSIS — M79671 Pain in right foot: Secondary | ICD-10-CM | POA: Diagnosis not present

## 2024-02-07 ENCOUNTER — Telehealth: Payer: Self-pay | Admitting: Orthopedic Surgery

## 2024-02-07 ENCOUNTER — Other Ambulatory Visit: Payer: Self-pay | Admitting: Radiology

## 2024-02-07 DIAGNOSIS — M5416 Radiculopathy, lumbar region: Secondary | ICD-10-CM

## 2024-02-07 DIAGNOSIS — G8929 Other chronic pain: Secondary | ICD-10-CM

## 2024-02-07 NOTE — Telephone Encounter (Signed)
 I called and spoke with patient and advised that there is not a pain management provider in [place], she asked if we  could send her to someone here in [place], I put a referral in for [facility]

## 2024-02-07 NOTE — Telephone Encounter (Signed)
 Pt called never used the last referral sent to pain management. Pt is asking fr Dr Christell Constant send send referral for pain management to location in [place]. Pease call pt when sent at [phone].

## 2024-02-15 DIAGNOSIS — I10 Essential (primary) hypertension: Secondary | ICD-10-CM | POA: Diagnosis not present

## 2024-02-15 DIAGNOSIS — E1165 Type 2 diabetes mellitus with hyperglycemia: Secondary | ICD-10-CM | POA: Diagnosis not present

## 2024-03-12 DIAGNOSIS — M48062 Spinal stenosis, lumbar region with neurogenic claudication: Secondary | ICD-10-CM | POA: Diagnosis not present

## 2024-03-12 DIAGNOSIS — M5416 Radiculopathy, lumbar region: Secondary | ICD-10-CM | POA: Diagnosis not present

## 2024-03-17 DIAGNOSIS — E1165 Type 2 diabetes mellitus with hyperglycemia: Secondary | ICD-10-CM | POA: Diagnosis not present

## 2024-03-17 DIAGNOSIS — I10 Essential (primary) hypertension: Secondary | ICD-10-CM | POA: Diagnosis not present

## 2024-03-25 DIAGNOSIS — M48062 Spinal stenosis, lumbar region with neurogenic claudication: Secondary | ICD-10-CM | POA: Diagnosis not present

## 2024-03-25 DIAGNOSIS — M47816 Spondylosis without myelopathy or radiculopathy, lumbar region: Secondary | ICD-10-CM | POA: Diagnosis not present

## 2024-03-25 DIAGNOSIS — M51362 Other intervertebral disc degeneration, lumbar region with discogenic back pain and lower extremity pain: Secondary | ICD-10-CM | POA: Diagnosis not present

## 2024-03-28 ENCOUNTER — Other Ambulatory Visit (HOSPITAL_COMMUNITY): Payer: Self-pay | Admitting: Neurosurgery

## 2024-03-28 ENCOUNTER — Ambulatory Visit (HOSPITAL_COMMUNITY)
Admission: RE | Admit: 2024-03-28 | Discharge: 2024-03-28 | Disposition: A | Discharge status: Home / Self Care | Source: Ambulatory Visit | Attending: Neurosurgery | Admitting: Neurosurgery

## 2024-03-28 DIAGNOSIS — M47816 Spondylosis without myelopathy or radiculopathy, lumbar region: Secondary | ICD-10-CM | POA: Diagnosis not present

## 2024-03-28 DIAGNOSIS — M48062 Spinal stenosis, lumbar region with neurogenic claudication: Secondary | ICD-10-CM | POA: Diagnosis not present

## 2024-03-28 DIAGNOSIS — M545 Low back pain, unspecified: Secondary | ICD-10-CM | POA: Diagnosis not present

## 2024-03-28 DIAGNOSIS — M160 Bilateral primary osteoarthritis of hip: Secondary | ICD-10-CM | POA: Diagnosis not present

## 2024-04-02 DIAGNOSIS — R2689 Other abnormalities of gait and mobility: Secondary | ICD-10-CM | POA: Diagnosis not present

## 2024-04-02 DIAGNOSIS — M62552 Muscle wasting and atrophy, not elsewhere classified, left thigh: Secondary | ICD-10-CM | POA: Diagnosis not present

## 2024-04-02 DIAGNOSIS — M62551 Muscle wasting and atrophy, not elsewhere classified, right thigh: Secondary | ICD-10-CM | POA: Diagnosis not present

## 2024-04-02 DIAGNOSIS — M5416 Radiculopathy, lumbar region: Secondary | ICD-10-CM | POA: Diagnosis not present

## 2024-04-02 DIAGNOSIS — M2569 Stiffness of other specified joint, not elsewhere classified: Secondary | ICD-10-CM | POA: Diagnosis not present

## 2024-04-04 DIAGNOSIS — R2689 Other abnormalities of gait and mobility: Secondary | ICD-10-CM | POA: Diagnosis not present

## 2024-04-04 DIAGNOSIS — M5416 Radiculopathy, lumbar region: Secondary | ICD-10-CM | POA: Diagnosis not present

## 2024-04-04 DIAGNOSIS — M62551 Muscle wasting and atrophy, not elsewhere classified, right thigh: Secondary | ICD-10-CM | POA: Diagnosis not present

## 2024-04-04 DIAGNOSIS — M2569 Stiffness of other specified joint, not elsewhere classified: Secondary | ICD-10-CM | POA: Diagnosis not present

## 2024-04-04 DIAGNOSIS — M62552 Muscle wasting and atrophy, not elsewhere classified, left thigh: Secondary | ICD-10-CM | POA: Diagnosis not present

## 2024-04-08 DIAGNOSIS — M5416 Radiculopathy, lumbar region: Secondary | ICD-10-CM | POA: Diagnosis not present

## 2024-04-09 DIAGNOSIS — M2569 Stiffness of other specified joint, not elsewhere classified: Secondary | ICD-10-CM | POA: Diagnosis not present

## 2024-04-09 DIAGNOSIS — M62551 Muscle wasting and atrophy, not elsewhere classified, right thigh: Secondary | ICD-10-CM | POA: Diagnosis not present

## 2024-04-09 DIAGNOSIS — M5416 Radiculopathy, lumbar region: Secondary | ICD-10-CM | POA: Diagnosis not present

## 2024-04-09 DIAGNOSIS — R2689 Other abnormalities of gait and mobility: Secondary | ICD-10-CM | POA: Diagnosis not present

## 2024-04-09 DIAGNOSIS — M62552 Muscle wasting and atrophy, not elsewhere classified, left thigh: Secondary | ICD-10-CM | POA: Diagnosis not present

## 2024-04-12 DIAGNOSIS — R2689 Other abnormalities of gait and mobility: Secondary | ICD-10-CM | POA: Diagnosis not present

## 2024-04-12 DIAGNOSIS — M5416 Radiculopathy, lumbar region: Secondary | ICD-10-CM | POA: Diagnosis not present

## 2024-04-12 DIAGNOSIS — M62551 Muscle wasting and atrophy, not elsewhere classified, right thigh: Secondary | ICD-10-CM | POA: Diagnosis not present

## 2024-04-12 DIAGNOSIS — M2569 Stiffness of other specified joint, not elsewhere classified: Secondary | ICD-10-CM | POA: Diagnosis not present

## 2024-04-12 DIAGNOSIS — M62552 Muscle wasting and atrophy, not elsewhere classified, left thigh: Secondary | ICD-10-CM | POA: Diagnosis not present

## 2024-04-16 DIAGNOSIS — I10 Essential (primary) hypertension: Secondary | ICD-10-CM | POA: Diagnosis not present

## 2024-04-16 DIAGNOSIS — E1165 Type 2 diabetes mellitus with hyperglycemia: Secondary | ICD-10-CM | POA: Diagnosis not present

## 2024-04-17 DIAGNOSIS — M62551 Muscle wasting and atrophy, not elsewhere classified, right thigh: Secondary | ICD-10-CM | POA: Diagnosis not present

## 2024-04-17 DIAGNOSIS — M62552 Muscle wasting and atrophy, not elsewhere classified, left thigh: Secondary | ICD-10-CM | POA: Diagnosis not present

## 2024-04-17 DIAGNOSIS — M5416 Radiculopathy, lumbar region: Secondary | ICD-10-CM | POA: Diagnosis not present

## 2024-04-17 DIAGNOSIS — R2689 Other abnormalities of gait and mobility: Secondary | ICD-10-CM | POA: Diagnosis not present

## 2024-04-17 DIAGNOSIS — M2569 Stiffness of other specified joint, not elsewhere classified: Secondary | ICD-10-CM | POA: Diagnosis not present

## 2024-04-23 DIAGNOSIS — E119 Type 2 diabetes mellitus without complications: Secondary | ICD-10-CM | POA: Diagnosis not present

## 2024-04-24 DIAGNOSIS — M5416 Radiculopathy, lumbar region: Secondary | ICD-10-CM | POA: Diagnosis not present

## 2024-04-24 DIAGNOSIS — M2569 Stiffness of other specified joint, not elsewhere classified: Secondary | ICD-10-CM | POA: Diagnosis not present

## 2024-04-24 DIAGNOSIS — M62552 Muscle wasting and atrophy, not elsewhere classified, left thigh: Secondary | ICD-10-CM | POA: Diagnosis not present

## 2024-04-24 DIAGNOSIS — M62551 Muscle wasting and atrophy, not elsewhere classified, right thigh: Secondary | ICD-10-CM | POA: Diagnosis not present

## 2024-04-24 DIAGNOSIS — R2689 Other abnormalities of gait and mobility: Secondary | ICD-10-CM | POA: Diagnosis not present

## 2024-04-29 DIAGNOSIS — M79674 Pain in right toe(s): Secondary | ICD-10-CM | POA: Diagnosis not present

## 2024-04-29 DIAGNOSIS — E1140 Type 2 diabetes mellitus with diabetic neuropathy, unspecified: Secondary | ICD-10-CM | POA: Diagnosis not present

## 2024-04-29 DIAGNOSIS — L110 Acquired keratosis follicularis: Secondary | ICD-10-CM | POA: Diagnosis not present

## 2024-04-29 DIAGNOSIS — M79672 Pain in left foot: Secondary | ICD-10-CM | POA: Diagnosis not present

## 2024-04-29 DIAGNOSIS — M79675 Pain in left toe(s): Secondary | ICD-10-CM | POA: Diagnosis not present

## 2024-04-29 DIAGNOSIS — I739 Peripheral vascular disease, unspecified: Secondary | ICD-10-CM | POA: Diagnosis not present

## 2024-04-29 DIAGNOSIS — M79671 Pain in right foot: Secondary | ICD-10-CM | POA: Diagnosis not present

## 2024-04-30 DIAGNOSIS — R2689 Other abnormalities of gait and mobility: Secondary | ICD-10-CM | POA: Diagnosis not present

## 2024-04-30 DIAGNOSIS — M62552 Muscle wasting and atrophy, not elsewhere classified, left thigh: Secondary | ICD-10-CM | POA: Diagnosis not present

## 2024-04-30 DIAGNOSIS — M5416 Radiculopathy, lumbar region: Secondary | ICD-10-CM | POA: Diagnosis not present

## 2024-04-30 DIAGNOSIS — M62551 Muscle wasting and atrophy, not elsewhere classified, right thigh: Secondary | ICD-10-CM | POA: Diagnosis not present

## 2024-04-30 DIAGNOSIS — M2569 Stiffness of other specified joint, not elsewhere classified: Secondary | ICD-10-CM | POA: Diagnosis not present

## 2024-05-03 DIAGNOSIS — M5416 Radiculopathy, lumbar region: Secondary | ICD-10-CM | POA: Diagnosis not present

## 2024-05-03 DIAGNOSIS — M62552 Muscle wasting and atrophy, not elsewhere classified, left thigh: Secondary | ICD-10-CM | POA: Diagnosis not present

## 2024-05-03 DIAGNOSIS — R2689 Other abnormalities of gait and mobility: Secondary | ICD-10-CM | POA: Diagnosis not present

## 2024-05-03 DIAGNOSIS — M62551 Muscle wasting and atrophy, not elsewhere classified, right thigh: Secondary | ICD-10-CM | POA: Diagnosis not present

## 2024-05-03 DIAGNOSIS — M2569 Stiffness of other specified joint, not elsewhere classified: Secondary | ICD-10-CM | POA: Diagnosis not present

## 2024-05-07 DIAGNOSIS — M2569 Stiffness of other specified joint, not elsewhere classified: Secondary | ICD-10-CM | POA: Diagnosis not present

## 2024-05-07 DIAGNOSIS — M62551 Muscle wasting and atrophy, not elsewhere classified, right thigh: Secondary | ICD-10-CM | POA: Diagnosis not present

## 2024-05-07 DIAGNOSIS — M5416 Radiculopathy, lumbar region: Secondary | ICD-10-CM | POA: Diagnosis not present

## 2024-05-07 DIAGNOSIS — M62552 Muscle wasting and atrophy, not elsewhere classified, left thigh: Secondary | ICD-10-CM | POA: Diagnosis not present

## 2024-05-07 DIAGNOSIS — R2689 Other abnormalities of gait and mobility: Secondary | ICD-10-CM | POA: Diagnosis not present

## 2024-05-09 DIAGNOSIS — M62552 Muscle wasting and atrophy, not elsewhere classified, left thigh: Secondary | ICD-10-CM | POA: Diagnosis not present

## 2024-05-09 DIAGNOSIS — M2569 Stiffness of other specified joint, not elsewhere classified: Secondary | ICD-10-CM | POA: Diagnosis not present

## 2024-05-09 DIAGNOSIS — M5416 Radiculopathy, lumbar region: Secondary | ICD-10-CM | POA: Diagnosis not present

## 2024-05-09 DIAGNOSIS — R2689 Other abnormalities of gait and mobility: Secondary | ICD-10-CM | POA: Diagnosis not present

## 2024-05-09 DIAGNOSIS — M62551 Muscle wasting and atrophy, not elsewhere classified, right thigh: Secondary | ICD-10-CM | POA: Diagnosis not present

## 2024-05-13 DIAGNOSIS — M62551 Muscle wasting and atrophy, not elsewhere classified, right thigh: Secondary | ICD-10-CM | POA: Diagnosis not present

## 2024-05-13 DIAGNOSIS — M2569 Stiffness of other specified joint, not elsewhere classified: Secondary | ICD-10-CM | POA: Diagnosis not present

## 2024-05-13 DIAGNOSIS — R2689 Other abnormalities of gait and mobility: Secondary | ICD-10-CM | POA: Diagnosis not present

## 2024-05-13 DIAGNOSIS — M62552 Muscle wasting and atrophy, not elsewhere classified, left thigh: Secondary | ICD-10-CM | POA: Diagnosis not present

## 2024-05-13 DIAGNOSIS — M5416 Radiculopathy, lumbar region: Secondary | ICD-10-CM | POA: Diagnosis not present

## 2024-05-17 DIAGNOSIS — E1165 Type 2 diabetes mellitus with hyperglycemia: Secondary | ICD-10-CM | POA: Diagnosis not present

## 2024-05-17 DIAGNOSIS — I10 Essential (primary) hypertension: Secondary | ICD-10-CM | POA: Diagnosis not present

## 2024-05-20 DIAGNOSIS — M51362 Other intervertebral disc degeneration, lumbar region with discogenic back pain and lower extremity pain: Secondary | ICD-10-CM | POA: Diagnosis not present

## 2024-05-20 DIAGNOSIS — M47816 Spondylosis without myelopathy or radiculopathy, lumbar region: Secondary | ICD-10-CM | POA: Diagnosis not present

## 2024-05-20 DIAGNOSIS — M48062 Spinal stenosis, lumbar region with neurogenic claudication: Secondary | ICD-10-CM | POA: Diagnosis not present

## 2024-05-22 DIAGNOSIS — M2569 Stiffness of other specified joint, not elsewhere classified: Secondary | ICD-10-CM | POA: Diagnosis not present

## 2024-05-22 DIAGNOSIS — R2689 Other abnormalities of gait and mobility: Secondary | ICD-10-CM | POA: Diagnosis not present

## 2024-05-22 DIAGNOSIS — M62552 Muscle wasting and atrophy, not elsewhere classified, left thigh: Secondary | ICD-10-CM | POA: Diagnosis not present

## 2024-05-22 DIAGNOSIS — M5416 Radiculopathy, lumbar region: Secondary | ICD-10-CM | POA: Diagnosis not present

## 2024-05-22 DIAGNOSIS — M62551 Muscle wasting and atrophy, not elsewhere classified, right thigh: Secondary | ICD-10-CM | POA: Diagnosis not present

## 2024-05-24 DIAGNOSIS — M5416 Radiculopathy, lumbar region: Secondary | ICD-10-CM | POA: Diagnosis not present

## 2024-05-24 DIAGNOSIS — M62552 Muscle wasting and atrophy, not elsewhere classified, left thigh: Secondary | ICD-10-CM | POA: Diagnosis not present

## 2024-05-24 DIAGNOSIS — M62551 Muscle wasting and atrophy, not elsewhere classified, right thigh: Secondary | ICD-10-CM | POA: Diagnosis not present

## 2024-05-24 DIAGNOSIS — R2689 Other abnormalities of gait and mobility: Secondary | ICD-10-CM | POA: Diagnosis not present

## 2024-05-24 DIAGNOSIS — M2569 Stiffness of other specified joint, not elsewhere classified: Secondary | ICD-10-CM | POA: Diagnosis not present

## 2024-06-05 DIAGNOSIS — I129 Hypertensive chronic kidney disease with stage 1 through stage 4 chronic kidney disease, or unspecified chronic kidney disease: Secondary | ICD-10-CM | POA: Diagnosis not present

## 2024-06-05 DIAGNOSIS — K5909 Other constipation: Secondary | ICD-10-CM | POA: Diagnosis not present

## 2024-06-05 DIAGNOSIS — Z01812 Encounter for preprocedural laboratory examination: Secondary | ICD-10-CM | POA: Diagnosis not present

## 2024-06-05 DIAGNOSIS — M48062 Spinal stenosis, lumbar region with neurogenic claudication: Secondary | ICD-10-CM | POA: Diagnosis not present

## 2024-06-05 DIAGNOSIS — Z01818 Encounter for other preprocedural examination: Secondary | ICD-10-CM | POA: Diagnosis not present

## 2024-06-05 DIAGNOSIS — I2510 Atherosclerotic heart disease of native coronary artery without angina pectoris: Secondary | ICD-10-CM | POA: Diagnosis not present

## 2024-06-05 DIAGNOSIS — N183 Chronic kidney disease, stage 3 unspecified: Secondary | ICD-10-CM | POA: Diagnosis not present

## 2024-06-05 DIAGNOSIS — N1831 Chronic kidney disease, stage 3a: Secondary | ICD-10-CM | POA: Diagnosis not present

## 2024-06-05 DIAGNOSIS — Z7951 Long term (current) use of inhaled steroids: Secondary | ICD-10-CM | POA: Diagnosis not present

## 2024-06-05 DIAGNOSIS — J45909 Unspecified asthma, uncomplicated: Secondary | ICD-10-CM | POA: Diagnosis not present

## 2024-06-05 DIAGNOSIS — K5900 Constipation, unspecified: Secondary | ICD-10-CM | POA: Diagnosis not present

## 2024-06-05 DIAGNOSIS — K219 Gastro-esophageal reflux disease without esophagitis: Secondary | ICD-10-CM | POA: Diagnosis not present

## 2024-06-05 DIAGNOSIS — J4520 Mild intermittent asthma, uncomplicated: Secondary | ICD-10-CM | POA: Diagnosis not present

## 2024-06-05 DIAGNOSIS — E1122 Type 2 diabetes mellitus with diabetic chronic kidney disease: Secondary | ICD-10-CM | POA: Diagnosis not present

## 2024-06-05 DIAGNOSIS — E119 Type 2 diabetes mellitus without complications: Secondary | ICD-10-CM | POA: Diagnosis not present

## 2024-06-05 DIAGNOSIS — Z7984 Long term (current) use of oral hypoglycemic drugs: Secondary | ICD-10-CM | POA: Diagnosis not present

## 2024-06-05 DIAGNOSIS — I10 Essential (primary) hypertension: Secondary | ICD-10-CM | POA: Diagnosis not present

## 2024-06-19 DIAGNOSIS — N189 Chronic kidney disease, unspecified: Secondary | ICD-10-CM | POA: Diagnosis not present

## 2024-06-19 DIAGNOSIS — E1122 Type 2 diabetes mellitus with diabetic chronic kidney disease: Secondary | ICD-10-CM | POA: Diagnosis not present

## 2024-06-19 DIAGNOSIS — K219 Gastro-esophageal reflux disease without esophagitis: Secondary | ICD-10-CM | POA: Diagnosis not present

## 2024-06-19 DIAGNOSIS — I129 Hypertensive chronic kidney disease with stage 1 through stage 4 chronic kidney disease, or unspecified chronic kidney disease: Secondary | ICD-10-CM | POA: Diagnosis not present

## 2024-06-19 DIAGNOSIS — M47816 Spondylosis without myelopathy or radiculopathy, lumbar region: Secondary | ICD-10-CM | POA: Diagnosis not present

## 2024-06-19 DIAGNOSIS — M48062 Spinal stenosis, lumbar region with neurogenic claudication: Secondary | ICD-10-CM | POA: Diagnosis not present

## 2024-06-19 DIAGNOSIS — J4520 Mild intermittent asthma, uncomplicated: Secondary | ICD-10-CM | POA: Diagnosis not present

## 2024-06-26 DIAGNOSIS — M961 Postlaminectomy syndrome, not elsewhere classified: Secondary | ICD-10-CM | POA: Diagnosis not present

## 2024-06-26 DIAGNOSIS — M5136 Other intervertebral disc degeneration, lumbar region with discogenic back pain only: Secondary | ICD-10-CM | POA: Diagnosis not present

## 2024-06-26 DIAGNOSIS — M1990 Unspecified osteoarthritis, unspecified site: Secondary | ICD-10-CM | POA: Diagnosis not present

## 2024-06-26 DIAGNOSIS — M5489 Other dorsalgia: Secondary | ICD-10-CM | POA: Diagnosis not present

## 2024-06-26 DIAGNOSIS — R600 Localized edema: Secondary | ICD-10-CM | POA: Diagnosis not present

## 2024-07-16 DIAGNOSIS — J4550 Severe persistent asthma, uncomplicated: Secondary | ICD-10-CM | POA: Diagnosis not present

## 2024-07-16 DIAGNOSIS — J4540 Moderate persistent asthma, uncomplicated: Secondary | ICD-10-CM | POA: Diagnosis not present

## 2024-07-16 DIAGNOSIS — I10 Essential (primary) hypertension: Secondary | ICD-10-CM | POA: Diagnosis not present

## 2024-07-16 DIAGNOSIS — J321 Chronic frontal sinusitis: Secondary | ICD-10-CM | POA: Diagnosis not present

## 2024-07-16 DIAGNOSIS — M961 Postlaminectomy syndrome, not elsewhere classified: Secondary | ICD-10-CM | POA: Diagnosis not present

## 2024-07-16 DIAGNOSIS — E1122 Type 2 diabetes mellitus with diabetic chronic kidney disease: Secondary | ICD-10-CM | POA: Diagnosis not present

## 2024-07-17 DIAGNOSIS — M51362 Other intervertebral disc degeneration, lumbar region with discogenic back pain and lower extremity pain: Secondary | ICD-10-CM | POA: Diagnosis not present

## 2024-07-17 DIAGNOSIS — M47816 Spondylosis without myelopathy or radiculopathy, lumbar region: Secondary | ICD-10-CM | POA: Diagnosis not present

## 2024-07-17 DIAGNOSIS — Z7982 Long term (current) use of aspirin: Secondary | ICD-10-CM | POA: Diagnosis not present

## 2024-07-17 DIAGNOSIS — Z7951 Long term (current) use of inhaled steroids: Secondary | ICD-10-CM | POA: Diagnosis not present

## 2024-07-17 DIAGNOSIS — Z9181 History of falling: Secondary | ICD-10-CM | POA: Diagnosis not present

## 2024-07-17 DIAGNOSIS — Z7984 Long term (current) use of oral hypoglycemic drugs: Secondary | ICD-10-CM | POA: Diagnosis not present

## 2024-07-17 DIAGNOSIS — Z4789 Encounter for other orthopedic aftercare: Secondary | ICD-10-CM | POA: Diagnosis not present

## 2024-07-17 DIAGNOSIS — M48062 Spinal stenosis, lumbar region with neurogenic claudication: Secondary | ICD-10-CM | POA: Diagnosis not present

## 2024-07-17 DIAGNOSIS — E119 Type 2 diabetes mellitus without complications: Secondary | ICD-10-CM | POA: Diagnosis not present

## 2024-07-17 DIAGNOSIS — K219 Gastro-esophageal reflux disease without esophagitis: Secondary | ICD-10-CM | POA: Diagnosis not present

## 2024-07-17 DIAGNOSIS — I10 Essential (primary) hypertension: Secondary | ICD-10-CM | POA: Diagnosis not present

## 2024-07-27 DIAGNOSIS — M1990 Unspecified osteoarthritis, unspecified site: Secondary | ICD-10-CM | POA: Diagnosis not present

## 2024-07-27 DIAGNOSIS — R600 Localized edema: Secondary | ICD-10-CM | POA: Diagnosis not present

## 2024-07-27 DIAGNOSIS — M961 Postlaminectomy syndrome, not elsewhere classified: Secondary | ICD-10-CM | POA: Diagnosis not present

## 2024-08-01 DIAGNOSIS — Z9181 History of falling: Secondary | ICD-10-CM | POA: Diagnosis not present

## 2024-08-01 DIAGNOSIS — Z7951 Long term (current) use of inhaled steroids: Secondary | ICD-10-CM | POA: Diagnosis not present

## 2024-08-01 DIAGNOSIS — I10 Essential (primary) hypertension: Secondary | ICD-10-CM | POA: Diagnosis not present

## 2024-08-01 DIAGNOSIS — M48062 Spinal stenosis, lumbar region with neurogenic claudication: Secondary | ICD-10-CM | POA: Diagnosis not present

## 2024-08-01 DIAGNOSIS — E119 Type 2 diabetes mellitus without complications: Secondary | ICD-10-CM | POA: Diagnosis not present

## 2024-08-01 DIAGNOSIS — K219 Gastro-esophageal reflux disease without esophagitis: Secondary | ICD-10-CM | POA: Diagnosis not present

## 2024-08-01 DIAGNOSIS — Z7984 Long term (current) use of oral hypoglycemic drugs: Secondary | ICD-10-CM | POA: Diagnosis not present

## 2024-08-01 DIAGNOSIS — Z4789 Encounter for other orthopedic aftercare: Secondary | ICD-10-CM | POA: Diagnosis not present

## 2024-08-01 DIAGNOSIS — M47816 Spondylosis without myelopathy or radiculopathy, lumbar region: Secondary | ICD-10-CM | POA: Diagnosis not present

## 2024-08-01 DIAGNOSIS — M51362 Other intervertebral disc degeneration, lumbar region with discogenic back pain and lower extremity pain: Secondary | ICD-10-CM | POA: Diagnosis not present

## 2024-08-01 DIAGNOSIS — Z7982 Long term (current) use of aspirin: Secondary | ICD-10-CM | POA: Diagnosis not present

## 2024-08-02 DIAGNOSIS — M47816 Spondylosis without myelopathy or radiculopathy, lumbar region: Secondary | ICD-10-CM | POA: Diagnosis not present

## 2024-08-02 DIAGNOSIS — M51362 Other intervertebral disc degeneration, lumbar region with discogenic back pain and lower extremity pain: Secondary | ICD-10-CM | POA: Diagnosis not present

## 2024-08-02 DIAGNOSIS — M48062 Spinal stenosis, lumbar region with neurogenic claudication: Secondary | ICD-10-CM | POA: Diagnosis not present

## 2024-08-02 DIAGNOSIS — Z9181 History of falling: Secondary | ICD-10-CM | POA: Diagnosis not present

## 2024-08-02 DIAGNOSIS — E119 Type 2 diabetes mellitus without complications: Secondary | ICD-10-CM | POA: Diagnosis not present

## 2024-08-02 DIAGNOSIS — Z7984 Long term (current) use of oral hypoglycemic drugs: Secondary | ICD-10-CM | POA: Diagnosis not present

## 2024-08-02 DIAGNOSIS — Z7951 Long term (current) use of inhaled steroids: Secondary | ICD-10-CM | POA: Diagnosis not present

## 2024-08-02 DIAGNOSIS — K219 Gastro-esophageal reflux disease without esophagitis: Secondary | ICD-10-CM | POA: Diagnosis not present

## 2024-08-02 DIAGNOSIS — Z7982 Long term (current) use of aspirin: Secondary | ICD-10-CM | POA: Diagnosis not present

## 2024-08-02 DIAGNOSIS — Z4789 Encounter for other orthopedic aftercare: Secondary | ICD-10-CM | POA: Diagnosis not present

## 2024-08-05 DIAGNOSIS — M79672 Pain in left foot: Secondary | ICD-10-CM | POA: Diagnosis not present

## 2024-08-05 DIAGNOSIS — M79675 Pain in left toe(s): Secondary | ICD-10-CM | POA: Diagnosis not present

## 2024-08-05 DIAGNOSIS — I739 Peripheral vascular disease, unspecified: Secondary | ICD-10-CM | POA: Diagnosis not present

## 2024-08-05 DIAGNOSIS — L110 Acquired keratosis follicularis: Secondary | ICD-10-CM | POA: Diagnosis not present

## 2024-08-05 DIAGNOSIS — E1140 Type 2 diabetes mellitus with diabetic neuropathy, unspecified: Secondary | ICD-10-CM | POA: Diagnosis not present

## 2024-08-05 DIAGNOSIS — M79674 Pain in right toe(s): Secondary | ICD-10-CM | POA: Diagnosis not present

## 2024-08-05 DIAGNOSIS — M79671 Pain in right foot: Secondary | ICD-10-CM | POA: Diagnosis not present

## 2024-08-06 DIAGNOSIS — M51362 Other intervertebral disc degeneration, lumbar region with discogenic back pain and lower extremity pain: Secondary | ICD-10-CM | POA: Diagnosis not present

## 2024-08-06 DIAGNOSIS — I10 Essential (primary) hypertension: Secondary | ICD-10-CM | POA: Diagnosis not present

## 2024-08-06 DIAGNOSIS — E119 Type 2 diabetes mellitus without complications: Secondary | ICD-10-CM | POA: Diagnosis not present

## 2024-08-06 DIAGNOSIS — Z7951 Long term (current) use of inhaled steroids: Secondary | ICD-10-CM | POA: Diagnosis not present

## 2024-08-06 DIAGNOSIS — Z7982 Long term (current) use of aspirin: Secondary | ICD-10-CM | POA: Diagnosis not present

## 2024-08-06 DIAGNOSIS — Z7984 Long term (current) use of oral hypoglycemic drugs: Secondary | ICD-10-CM | POA: Diagnosis not present

## 2024-08-06 DIAGNOSIS — Z9181 History of falling: Secondary | ICD-10-CM | POA: Diagnosis not present

## 2024-08-06 DIAGNOSIS — K219 Gastro-esophageal reflux disease without esophagitis: Secondary | ICD-10-CM | POA: Diagnosis not present

## 2024-08-06 DIAGNOSIS — M48062 Spinal stenosis, lumbar region with neurogenic claudication: Secondary | ICD-10-CM | POA: Diagnosis not present

## 2024-08-06 DIAGNOSIS — Z4789 Encounter for other orthopedic aftercare: Secondary | ICD-10-CM | POA: Diagnosis not present

## 2024-08-06 DIAGNOSIS — M47816 Spondylosis without myelopathy or radiculopathy, lumbar region: Secondary | ICD-10-CM | POA: Diagnosis not present

## 2024-08-14 DIAGNOSIS — I10 Essential (primary) hypertension: Secondary | ICD-10-CM | POA: Diagnosis not present

## 2024-08-14 DIAGNOSIS — M47816 Spondylosis without myelopathy or radiculopathy, lumbar region: Secondary | ICD-10-CM | POA: Diagnosis not present

## 2024-08-14 DIAGNOSIS — E119 Type 2 diabetes mellitus without complications: Secondary | ICD-10-CM | POA: Diagnosis not present

## 2024-08-14 DIAGNOSIS — Z7984 Long term (current) use of oral hypoglycemic drugs: Secondary | ICD-10-CM | POA: Diagnosis not present

## 2024-08-14 DIAGNOSIS — Z7982 Long term (current) use of aspirin: Secondary | ICD-10-CM | POA: Diagnosis not present

## 2024-08-14 DIAGNOSIS — Z9181 History of falling: Secondary | ICD-10-CM | POA: Diagnosis not present

## 2024-08-14 DIAGNOSIS — M51362 Other intervertebral disc degeneration, lumbar region with discogenic back pain and lower extremity pain: Secondary | ICD-10-CM | POA: Diagnosis not present

## 2024-08-14 DIAGNOSIS — M48062 Spinal stenosis, lumbar region with neurogenic claudication: Secondary | ICD-10-CM | POA: Diagnosis not present

## 2024-08-14 DIAGNOSIS — K219 Gastro-esophageal reflux disease without esophagitis: Secondary | ICD-10-CM | POA: Diagnosis not present

## 2024-08-14 DIAGNOSIS — Z7951 Long term (current) use of inhaled steroids: Secondary | ICD-10-CM | POA: Diagnosis not present

## 2024-08-16 DIAGNOSIS — I10 Essential (primary) hypertension: Secondary | ICD-10-CM | POA: Diagnosis not present

## 2024-08-16 DIAGNOSIS — E1165 Type 2 diabetes mellitus with hyperglycemia: Secondary | ICD-10-CM | POA: Diagnosis not present

## 2024-08-20 DIAGNOSIS — Z4789 Encounter for other orthopedic aftercare: Secondary | ICD-10-CM | POA: Diagnosis not present

## 2024-08-20 DIAGNOSIS — M51362 Other intervertebral disc degeneration, lumbar region with discogenic back pain and lower extremity pain: Secondary | ICD-10-CM | POA: Diagnosis not present

## 2024-08-20 DIAGNOSIS — M48062 Spinal stenosis, lumbar region with neurogenic claudication: Secondary | ICD-10-CM | POA: Diagnosis not present

## 2024-08-20 DIAGNOSIS — M47816 Spondylosis without myelopathy or radiculopathy, lumbar region: Secondary | ICD-10-CM | POA: Diagnosis not present

## 2024-08-20 DIAGNOSIS — Z7982 Long term (current) use of aspirin: Secondary | ICD-10-CM | POA: Diagnosis not present

## 2024-08-20 DIAGNOSIS — K219 Gastro-esophageal reflux disease without esophagitis: Secondary | ICD-10-CM | POA: Diagnosis not present

## 2024-08-20 DIAGNOSIS — Z7951 Long term (current) use of inhaled steroids: Secondary | ICD-10-CM | POA: Diagnosis not present

## 2024-08-20 DIAGNOSIS — Z9181 History of falling: Secondary | ICD-10-CM | POA: Diagnosis not present

## 2024-08-20 DIAGNOSIS — E119 Type 2 diabetes mellitus without complications: Secondary | ICD-10-CM | POA: Diagnosis not present

## 2024-08-20 DIAGNOSIS — I10 Essential (primary) hypertension: Secondary | ICD-10-CM | POA: Diagnosis not present

## 2024-08-20 DIAGNOSIS — Z7984 Long term (current) use of oral hypoglycemic drugs: Secondary | ICD-10-CM | POA: Diagnosis not present

## 2024-08-22 DIAGNOSIS — Z7982 Long term (current) use of aspirin: Secondary | ICD-10-CM | POA: Diagnosis not present

## 2024-08-22 DIAGNOSIS — E119 Type 2 diabetes mellitus without complications: Secondary | ICD-10-CM | POA: Diagnosis not present

## 2024-08-22 DIAGNOSIS — Z4789 Encounter for other orthopedic aftercare: Secondary | ICD-10-CM | POA: Diagnosis not present

## 2024-08-22 DIAGNOSIS — Z7984 Long term (current) use of oral hypoglycemic drugs: Secondary | ICD-10-CM | POA: Diagnosis not present

## 2024-08-22 DIAGNOSIS — M51362 Other intervertebral disc degeneration, lumbar region with discogenic back pain and lower extremity pain: Secondary | ICD-10-CM | POA: Diagnosis not present

## 2024-08-22 DIAGNOSIS — M48062 Spinal stenosis, lumbar region with neurogenic claudication: Secondary | ICD-10-CM | POA: Diagnosis not present

## 2024-08-22 DIAGNOSIS — Z7951 Long term (current) use of inhaled steroids: Secondary | ICD-10-CM | POA: Diagnosis not present

## 2024-08-22 DIAGNOSIS — Z9181 History of falling: Secondary | ICD-10-CM | POA: Diagnosis not present

## 2024-08-22 DIAGNOSIS — K219 Gastro-esophageal reflux disease without esophagitis: Secondary | ICD-10-CM | POA: Diagnosis not present

## 2024-08-22 DIAGNOSIS — I10 Essential (primary) hypertension: Secondary | ICD-10-CM | POA: Diagnosis not present

## 2024-08-22 DIAGNOSIS — M47816 Spondylosis without myelopathy or radiculopathy, lumbar region: Secondary | ICD-10-CM | POA: Diagnosis not present

## 2024-08-27 DIAGNOSIS — M1990 Unspecified osteoarthritis, unspecified site: Secondary | ICD-10-CM | POA: Diagnosis not present

## 2024-08-27 DIAGNOSIS — M961 Postlaminectomy syndrome, not elsewhere classified: Secondary | ICD-10-CM | POA: Diagnosis not present

## 2024-08-27 DIAGNOSIS — R600 Localized edema: Secondary | ICD-10-CM | POA: Diagnosis not present

## 2024-08-28 DIAGNOSIS — M51362 Other intervertebral disc degeneration, lumbar region with discogenic back pain and lower extremity pain: Secondary | ICD-10-CM | POA: Diagnosis not present

## 2024-08-28 DIAGNOSIS — Z7984 Long term (current) use of oral hypoglycemic drugs: Secondary | ICD-10-CM | POA: Diagnosis not present

## 2024-08-28 DIAGNOSIS — Z7951 Long term (current) use of inhaled steroids: Secondary | ICD-10-CM | POA: Diagnosis not present

## 2024-08-28 DIAGNOSIS — K219 Gastro-esophageal reflux disease without esophagitis: Secondary | ICD-10-CM | POA: Diagnosis not present

## 2024-08-28 DIAGNOSIS — M47816 Spondylosis without myelopathy or radiculopathy, lumbar region: Secondary | ICD-10-CM | POA: Diagnosis not present

## 2024-08-28 DIAGNOSIS — M48062 Spinal stenosis, lumbar region with neurogenic claudication: Secondary | ICD-10-CM | POA: Diagnosis not present

## 2024-08-28 DIAGNOSIS — Z7982 Long term (current) use of aspirin: Secondary | ICD-10-CM | POA: Diagnosis not present

## 2024-08-28 DIAGNOSIS — Z9181 History of falling: Secondary | ICD-10-CM | POA: Diagnosis not present

## 2024-08-28 DIAGNOSIS — E119 Type 2 diabetes mellitus without complications: Secondary | ICD-10-CM | POA: Diagnosis not present

## 2024-09-15 DIAGNOSIS — E1165 Type 2 diabetes mellitus with hyperglycemia: Secondary | ICD-10-CM | POA: Diagnosis not present

## 2024-09-15 DIAGNOSIS — I10 Essential (primary) hypertension: Secondary | ICD-10-CM | POA: Diagnosis not present

## 2024-09-26 DIAGNOSIS — R600 Localized edema: Secondary | ICD-10-CM | POA: Diagnosis not present

## 2024-09-26 DIAGNOSIS — M961 Postlaminectomy syndrome, not elsewhere classified: Secondary | ICD-10-CM | POA: Diagnosis not present

## 2024-09-26 DIAGNOSIS — M1990 Unspecified osteoarthritis, unspecified site: Secondary | ICD-10-CM | POA: Diagnosis not present

## 2024-10-01 ENCOUNTER — Other Ambulatory Visit (HOSPITAL_COMMUNITY): Payer: Self-pay | Admitting: Internal Medicine

## 2024-10-01 DIAGNOSIS — Z1231 Encounter for screening mammogram for malignant neoplasm of breast: Secondary | ICD-10-CM

## 2024-10-16 ENCOUNTER — Ambulatory Visit (HOSPITAL_COMMUNITY)
Admission: RE | Admit: 2024-10-16 | Discharge: 2024-10-16 | Disposition: A | Discharge status: Home / Self Care | Source: Ambulatory Visit | Attending: Internal Medicine | Admitting: Internal Medicine

## 2024-10-16 DIAGNOSIS — Z1231 Encounter for screening mammogram for malignant neoplasm of breast: Secondary | ICD-10-CM | POA: Insufficient documentation
